# Patient Record
Sex: Female | Born: 1970 | Race: White | Hispanic: No | Marital: Married | Smoking: Current every day smoker
Health system: Southern US, Community
[De-identification: ages and names within clinical notes are randomized; demographics above are authoritative.]

## PROBLEM LIST (undated history)

## (undated) VITALS — BP 128/90 | HR 94 | Temp 98.0°F | Resp 16

## (undated) DIAGNOSIS — I10 Essential (primary) hypertension: Secondary | ICD-10-CM

## (undated) DIAGNOSIS — F99 Mental disorder, not otherwise specified: Secondary | ICD-10-CM

## (undated) DIAGNOSIS — G5630 Lesion of radial nerve, unspecified upper limb: Secondary | ICD-10-CM

## (undated) DIAGNOSIS — E079 Disorder of thyroid, unspecified: Secondary | ICD-10-CM

## (undated) DIAGNOSIS — E039 Hypothyroidism, unspecified: Secondary | ICD-10-CM

## (undated) DIAGNOSIS — F1920 Other psychoactive substance dependence, uncomplicated: Secondary | ICD-10-CM

## (undated) DIAGNOSIS — F1994 Other psychoactive substance use, unspecified with psychoactive substance-induced mood disorder: Secondary | ICD-10-CM

## (undated) DIAGNOSIS — B1920 Unspecified viral hepatitis C without hepatic coma: Secondary | ICD-10-CM

## (undated) HISTORY — PX: ENDOMETRIAL ABLATION: SHX621

## (undated) HISTORY — PX: FOOT SURGERY: SHX648

## (undated) HISTORY — PX: TUBAL LIGATION: SHX77

---

## 2010-08-16 ENCOUNTER — Encounter: Admission: RE | Admit: 2010-08-16 | Discharge: 2010-08-16 | Payer: Self-pay | Admitting: Psychiatry

## 2010-09-11 ENCOUNTER — Emergency Department (HOSPITAL_COMMUNITY)
Admission: EM | Admit: 2010-09-11 | Discharge: 2010-09-11 | Payer: Self-pay | Source: Home / Self Care | Admitting: Emergency Medicine

## 2010-11-17 ENCOUNTER — Emergency Department (HOSPITAL_COMMUNITY)
Admission: EM | Admit: 2010-11-17 | Discharge: 2010-11-17 | Disposition: A | Discharge status: Home / Self Care | Payer: Medicare HMO | Attending: Emergency Medicine | Admitting: Emergency Medicine

## 2010-11-17 DIAGNOSIS — G8929 Other chronic pain: Secondary | ICD-10-CM | POA: Insufficient documentation

## 2010-11-17 DIAGNOSIS — R609 Edema, unspecified: Secondary | ICD-10-CM | POA: Insufficient documentation

## 2010-11-17 DIAGNOSIS — S61209A Unspecified open wound of unspecified finger without damage to nail, initial encounter: Secondary | ICD-10-CM | POA: Insufficient documentation

## 2010-11-17 DIAGNOSIS — Z79899 Other long term (current) drug therapy: Secondary | ICD-10-CM | POA: Insufficient documentation

## 2010-11-17 DIAGNOSIS — M545 Low back pain, unspecified: Secondary | ICD-10-CM | POA: Insufficient documentation

## 2010-11-17 DIAGNOSIS — IMO0002 Reserved for concepts with insufficient information to code with codable children: Secondary | ICD-10-CM | POA: Insufficient documentation

## 2010-11-17 DIAGNOSIS — W230XXA Caught, crushed, jammed, or pinched between moving objects, initial encounter: Secondary | ICD-10-CM | POA: Insufficient documentation

## 2010-11-17 DIAGNOSIS — F329 Major depressive disorder, single episode, unspecified: Secondary | ICD-10-CM | POA: Insufficient documentation

## 2010-11-17 DIAGNOSIS — E039 Hypothyroidism, unspecified: Secondary | ICD-10-CM | POA: Insufficient documentation

## 2010-11-17 DIAGNOSIS — Y929 Unspecified place or not applicable: Secondary | ICD-10-CM | POA: Insufficient documentation

## 2010-11-17 DIAGNOSIS — F3289 Other specified depressive episodes: Secondary | ICD-10-CM | POA: Insufficient documentation

## 2010-12-17 ENCOUNTER — Ambulatory Visit (HOSPITAL_COMMUNITY): Payer: Self-pay | Admitting: Physician Assistant

## 2010-12-17 DIAGNOSIS — F329 Major depressive disorder, single episode, unspecified: Secondary | ICD-10-CM

## 2010-12-30 ENCOUNTER — Inpatient Hospital Stay (INDEPENDENT_AMBULATORY_CARE_PROVIDER_SITE_OTHER)
Admission: RE | Admit: 2010-12-30 | Discharge: 2010-12-30 | Disposition: A | Discharge status: Home / Self Care | Payer: Managed Care, Other (non HMO) | Source: Ambulatory Visit | Attending: Emergency Medicine | Admitting: Emergency Medicine

## 2010-12-30 DIAGNOSIS — M722 Plantar fascial fibromatosis: Secondary | ICD-10-CM

## 2011-01-16 ENCOUNTER — Inpatient Hospital Stay (INDEPENDENT_AMBULATORY_CARE_PROVIDER_SITE_OTHER)
Admission: RE | Admit: 2011-01-16 | Discharge: 2011-01-16 | Disposition: A | Discharge status: Home / Self Care | Payer: Managed Care, Other (non HMO) | Source: Ambulatory Visit | Attending: Family Medicine | Admitting: Family Medicine

## 2011-01-16 DIAGNOSIS — I10 Essential (primary) hypertension: Secondary | ICD-10-CM

## 2011-01-16 DIAGNOSIS — F411 Generalized anxiety disorder: Secondary | ICD-10-CM

## 2011-03-02 ENCOUNTER — Inpatient Hospital Stay (INDEPENDENT_AMBULATORY_CARE_PROVIDER_SITE_OTHER)
Admission: RE | Admit: 2011-03-02 | Discharge: 2011-03-02 | Disposition: A | Discharge status: Home / Self Care | Payer: Managed Care, Other (non HMO) | Source: Ambulatory Visit | Attending: Emergency Medicine | Admitting: Emergency Medicine

## 2011-03-02 DIAGNOSIS — M79609 Pain in unspecified limb: Secondary | ICD-10-CM

## 2011-03-09 ENCOUNTER — Emergency Department (HOSPITAL_COMMUNITY)
Admission: EM | Admit: 2011-03-09 | Discharge: 2011-03-09 | Disposition: A | Discharge status: Home / Self Care | Payer: Managed Care, Other (non HMO) | Attending: Emergency Medicine | Admitting: Emergency Medicine

## 2011-03-09 DIAGNOSIS — R030 Elevated blood-pressure reading, without diagnosis of hypertension: Secondary | ICD-10-CM | POA: Insufficient documentation

## 2011-03-09 DIAGNOSIS — H53149 Visual discomfort, unspecified: Secondary | ICD-10-CM | POA: Insufficient documentation

## 2011-03-09 DIAGNOSIS — Z79899 Other long term (current) drug therapy: Secondary | ICD-10-CM | POA: Insufficient documentation

## 2011-03-09 DIAGNOSIS — R071 Chest pain on breathing: Secondary | ICD-10-CM | POA: Insufficient documentation

## 2011-03-09 DIAGNOSIS — R000 Tachycardia, unspecified: Secondary | ICD-10-CM | POA: Insufficient documentation

## 2011-03-09 DIAGNOSIS — R51 Headache: Secondary | ICD-10-CM | POA: Insufficient documentation

## 2011-03-09 DIAGNOSIS — R209 Unspecified disturbances of skin sensation: Secondary | ICD-10-CM | POA: Insufficient documentation

## 2011-03-09 DIAGNOSIS — R112 Nausea with vomiting, unspecified: Secondary | ICD-10-CM | POA: Insufficient documentation

## 2011-03-09 DIAGNOSIS — F341 Dysthymic disorder: Secondary | ICD-10-CM | POA: Insufficient documentation

## 2011-03-09 DIAGNOSIS — E039 Hypothyroidism, unspecified: Secondary | ICD-10-CM | POA: Insufficient documentation

## 2011-03-09 DIAGNOSIS — F17200 Nicotine dependence, unspecified, uncomplicated: Secondary | ICD-10-CM | POA: Insufficient documentation

## 2011-03-09 LAB — URINALYSIS, ROUTINE W REFLEX MICROSCOPIC
Leukocytes, UA: NEGATIVE
Nitrite: NEGATIVE
Protein, ur: NEGATIVE mg/dL
Specific Gravity, Urine: 1.015 (ref 1.005–1.030)
Urobilinogen, UA: 0.2 mg/dL (ref 0.0–1.0)

## 2011-03-09 LAB — POCT I-STAT, CHEM 8
Chloride: 106 mEq/L (ref 96–112)
HCT: 44 % (ref 36.0–46.0)
Hemoglobin: 15 g/dL (ref 12.0–15.0)
Potassium: 4.1 mEq/L (ref 3.5–5.1)
Sodium: 142 mEq/L (ref 135–145)

## 2011-10-31 ENCOUNTER — Ambulatory Visit (HOSPITAL_COMMUNITY)
Admission: RE | Admit: 2011-10-31 | Discharge: 2011-10-31 | Disposition: A | Discharge status: Home / Self Care | Payer: Managed Care, Other (non HMO) | Source: Ambulatory Visit | Attending: Internal Medicine | Admitting: Internal Medicine

## 2011-10-31 DIAGNOSIS — M7989 Other specified soft tissue disorders: Secondary | ICD-10-CM

## 2011-10-31 DIAGNOSIS — R609 Edema, unspecified: Secondary | ICD-10-CM

## 2011-10-31 DIAGNOSIS — M79609 Pain in unspecified limb: Secondary | ICD-10-CM

## 2011-10-31 DIAGNOSIS — R52 Pain, unspecified: Secondary | ICD-10-CM

## 2011-10-31 NOTE — Progress Notes (Signed)
VASCULAR LAB PRELIMINARY    No obvious evidence of deep vein thrombosis bilaterally. No evidence of Baker's cyst bilaterally.    Barbara Key, 10/31/2011, 6:03 PM

## 2011-11-07 ENCOUNTER — Emergency Department (HOSPITAL_COMMUNITY): Payer: Managed Care, Other (non HMO)

## 2011-11-07 ENCOUNTER — Emergency Department (HOSPITAL_COMMUNITY)
Admission: EM | Admit: 2011-11-07 | Discharge: 2011-11-07 | Disposition: A | Discharge status: Home / Self Care | Payer: Managed Care, Other (non HMO) | Attending: Emergency Medicine | Admitting: Emergency Medicine

## 2011-11-07 ENCOUNTER — Encounter (HOSPITAL_COMMUNITY): Payer: Self-pay | Admitting: *Deleted

## 2011-11-07 DIAGNOSIS — R51 Headache: Secondary | ICD-10-CM | POA: Insufficient documentation

## 2011-11-07 DIAGNOSIS — IMO0001 Reserved for inherently not codable concepts without codable children: Secondary | ICD-10-CM | POA: Insufficient documentation

## 2011-11-07 DIAGNOSIS — R05 Cough: Secondary | ICD-10-CM | POA: Insufficient documentation

## 2011-11-07 DIAGNOSIS — J111 Influenza due to unidentified influenza virus with other respiratory manifestations: Secondary | ICD-10-CM | POA: Insufficient documentation

## 2011-11-07 DIAGNOSIS — J3489 Other specified disorders of nose and nasal sinuses: Secondary | ICD-10-CM | POA: Insufficient documentation

## 2011-11-07 DIAGNOSIS — M549 Dorsalgia, unspecified: Secondary | ICD-10-CM | POA: Insufficient documentation

## 2011-11-07 DIAGNOSIS — Z79899 Other long term (current) drug therapy: Secondary | ICD-10-CM | POA: Insufficient documentation

## 2011-11-07 DIAGNOSIS — R059 Cough, unspecified: Secondary | ICD-10-CM | POA: Insufficient documentation

## 2011-11-07 DIAGNOSIS — R070 Pain in throat: Secondary | ICD-10-CM | POA: Insufficient documentation

## 2011-11-07 DIAGNOSIS — R109 Unspecified abdominal pain: Secondary | ICD-10-CM | POA: Insufficient documentation

## 2011-11-07 DIAGNOSIS — J40 Bronchitis, not specified as acute or chronic: Secondary | ICD-10-CM | POA: Insufficient documentation

## 2011-11-07 DIAGNOSIS — R062 Wheezing: Secondary | ICD-10-CM | POA: Insufficient documentation

## 2011-11-07 DIAGNOSIS — R509 Fever, unspecified: Secondary | ICD-10-CM | POA: Insufficient documentation

## 2011-11-07 DIAGNOSIS — F17200 Nicotine dependence, unspecified, uncomplicated: Secondary | ICD-10-CM | POA: Insufficient documentation

## 2011-11-07 DIAGNOSIS — R1110 Vomiting, unspecified: Secondary | ICD-10-CM | POA: Insufficient documentation

## 2011-11-07 LAB — URINE MICROSCOPIC-ADD ON

## 2011-11-07 LAB — URINALYSIS, ROUTINE W REFLEX MICROSCOPIC
Leukocytes, UA: NEGATIVE
Nitrite: NEGATIVE
Protein, ur: 100 mg/dL — AB
Urobilinogen, UA: 1 mg/dL (ref 0.0–1.0)

## 2011-11-07 LAB — CBC
Hemoglobin: 14.6 g/dL (ref 12.0–15.0)
MCH: 32.3 pg (ref 26.0–34.0)
MCHC: 34 g/dL (ref 30.0–36.0)
RDW: 13.5 % (ref 11.5–15.5)

## 2011-11-07 LAB — BASIC METABOLIC PANEL
BUN: 18 mg/dL (ref 6–23)
Creatinine, Ser: 0.85 mg/dL (ref 0.50–1.10)
GFR calc Af Amer: >90 mL/min (ref >90)
GFR calc non Af Amer: 86 mL/min — ABNORMAL LOW (ref >90)
Potassium: 3.3 mEq/L — ABNORMAL LOW (ref 3.5–5.1)

## 2011-11-07 LAB — DIFFERENTIAL
Basophils Absolute: 0 10*3/uL (ref 0.0–0.1)
Basophils Relative: 0 % (ref 0–1)
Eosinophils Absolute: 0.1 10*3/uL (ref 0.0–0.7)
Monocytes Relative: 8 % (ref 3–12)
Neutrophils Relative %: 73 % (ref 43–77)

## 2011-11-07 MED ORDER — POTASSIUM CHLORIDE 20 MEQ/15ML (10%) PO LIQD
20.0000 meq | Freq: Once | ORAL | Status: AC
  Administered 2011-11-07: 20 meq via ORAL
  Filled 2011-11-07: qty 15

## 2011-11-07 MED ORDER — SODIUM CHLORIDE 0.9 % IV BOLUS (SEPSIS)
1000.0000 mL | Freq: Once | INTRAVENOUS | Status: AC
  Administered 2011-11-07: 1000 mL via INTRAVENOUS

## 2011-11-07 MED ORDER — ALBUTEROL SULFATE HFA 108 (90 BASE) MCG/ACT IN AERS: 1.0000 | INHALATION_SPRAY | Freq: Four times a day (QID) | RESPIRATORY_TRACT | Status: DC | PRN

## 2011-11-07 MED ORDER — HYDROCODONE-ACETAMINOPHEN 5-325 MG PO TABS
1.0000 | ORAL_TABLET | Freq: Once | ORAL | Status: AC
  Administered 2011-11-07: 1 via ORAL
  Filled 2011-11-07: qty 1

## 2011-11-07 MED ORDER — KETOROLAC TROMETHAMINE 30 MG/ML IJ SOLN
30.0000 mg | Freq: Once | INTRAMUSCULAR | Status: AC
  Administered 2011-11-07: 30 mg via INTRAVENOUS
  Filled 2011-11-07: qty 1

## 2011-11-07 MED ORDER — IPRATROPIUM BROMIDE 0.02 % IN SOLN
0.5000 mg | RESPIRATORY_TRACT | Status: DC
  Administered 2011-11-07: 0.5 mg via RESPIRATORY_TRACT
  Filled 2011-11-07: qty 2.5

## 2011-11-07 MED ORDER — ONDANSETRON HCL 4 MG/2ML IJ SOLN
4.0000 mg | Freq: Once | INTRAMUSCULAR | Status: AC
  Administered 2011-11-07: 4 mg via INTRAVENOUS
  Filled 2011-11-07: qty 2

## 2011-11-07 MED ORDER — HYDROMORPHONE HCL PF 1 MG/ML IJ SOLN
1.0000 mg | Freq: Once | INTRAMUSCULAR | Status: AC
  Administered 2011-11-07: 1 mg via INTRAVENOUS
  Filled 2011-11-07: qty 1

## 2011-11-07 MED ORDER — ACETAMINOPHEN-CODEINE #3 300-30 MG PO TABS: 1.0000 | ORAL_TABLET | Freq: Four times a day (QID) | ORAL | Status: AC | PRN

## 2011-11-07 MED ORDER — ALBUTEROL SULFATE (5 MG/ML) 0.5% IN NEBU
2.5000 mg | INHALATION_SOLUTION | RESPIRATORY_TRACT | Status: DC
  Administered 2011-11-07: 2.5 mg via RESPIRATORY_TRACT
  Filled 2011-11-07: qty 0.5

## 2011-11-07 NOTE — ED Notes (Signed)
RT notified of neb tx.

## 2011-11-07 NOTE — ED Notes (Signed)
Per [facility], pt seen by PCP this a.m. & dx'd with kidney infection & flu, given pain meds within the last 2 hours.

## 2011-11-07 NOTE — ED Provider Notes (Signed)
History     CSN: [id number]  Arrival date & time 11/07/11  1607   First MD Initiated Contact with Patient 11/07/11 1702      Chief Complaint  Patient presents with  . Back Pain  . Hematuria    (Consider location/radiation/quality/duration/timing/severity/associated sxs/prior treatment) HPI  47 yoF history healthy presents with multiple complaints. Patient states that last evening she began to experience diffuse body aches, fever to 102. She states she's had multiple episodes of nonbilious nonbloody emesis. She denies area. States that she is unable to tolerate by mouth. She began to experience mild diffuse abdominal pain after multiple episodes of vomiting. She thinks it as soreness secondary to the vomiting. She was seen by her primary care physician this morning was diagnosed with ?pyelonephritis and possible influenza. The patient was prescribed diclofenac, ibuprofen, doxycycline, Tamiflu. She's taken without relief. She states that her back pain has worsened. She describes it as mid back and rates as 6/10 at this time. She denies chest pain, shortness of breath. She states she's had a productive cough along with a frontal headache, sore throat, rhinorrhea nasal congestion. She is a smoker. She denies any history of asthma or COPD. She denies wheezing. Denies hematuria/dysuria/freq/urgency. Urinating less.    ED Notes, ED Provider Notes from 11/07/11 0000 to 11/07/11 16:11:21       Servando Snare Bivens, RN 11/07/2011 16:10      Pt in c/o bilateral flank pain, states she went to her MD and was dx with kidney infection and flu, called back due to increased pain and told to come to ED         Dalbert Batman Reagan, RN 11/07/2011 16:09      Per [facility], pt seen by PCP this a.m. & dx'd with kidney infection & flu, given pain meds within the last 2 hours.    History reviewed. No pertinent past medical history.  History reviewed. No pertinent past surgical history.  History reviewed. No  pertinent family history.  History  Substance Use Topics  . Smoking status: Not on file  . Smokeless tobacco: Not on file  . Alcohol Use: Not on file    OB History    Grav Para Term Preterm Abortions TAB SAB Ect Mult Living                  Review of Systems  All other systems reviewed and are negative.  except as noted HPI   Allergies  Review of patient's allergies indicates no known allergies.  Home Medications   Current Outpatient Rx  Name Route Sig Dispense Refill  . DICLOFENAC SODIUM 75 MG PO TBEC Oral Take 75 mg by mouth 2 (two) times daily.    Marland Kitchen DOXYCYCLINE HYCLATE 100 MG PO TABS Oral Take 100 mg by mouth 2 (two) times daily.    Marland Kitchen LEVOTHYROXINE SODIUM 175 MCG PO TABS Oral Take 175 mcg by mouth daily.    Marland Kitchen DEXATRIM MAX COMPLEX 7 PO CAPS Oral Take 2 capsules by mouth daily.    Marland Kitchen ONDANSETRON HCL 4 MG PO TABS Oral Take 4 mg by mouth every 8 (eight) hours as needed. For nausea    . ACETAMINOPHEN-CODEINE #3 300-30 MG PO TABS Oral Take 1-2 tablets by mouth every 6 (six) hours as needed for pain. 15 tablet 0  . ALBUTEROL SULFATE HFA 108 (90 BASE) MCG/ACT IN AERS Inhalation Inhale 1-2 puffs into the lungs every 6 (six) hours as needed for wheezing. 1 Inhaler 0  BP 134/95  Pulse 80  Temp(Src) 98.8 F (37.1 C) (Oral)  Resp 18  SpO2 93%  Physical Exam  Nursing note and vitals reviewed. Constitutional: She is oriented to person, place, and time. She appears well-developed and well-nourished.  HENT:  Head: Atraumatic.       muc membranes dry  Eyes: Conjunctivae and EOM are normal. Pupils are equal, round, and reactive to light.  Neck: Normal range of motion. Neck supple.  Cardiovascular: Normal rate, regular rhythm, normal heart sounds and intact distal pulses.   Pulmonary/Chest: Effort normal. No respiratory distress. She has wheezes. She has no rales.       Right mid lower lung fields with coarse wheeze, expiratory  Abdominal: Soft. She exhibits no distension.  There is tenderness. There is no rebound and no guarding.       L. diffuse tenderness to palpation, no rebounding, no guarding, no palpable hernia  Musculoskeletal: Normal range of motion. She exhibits no edema and no tenderness.  Neurological: She is alert and oriented to person, place, and time.  Skin: Skin is warm and dry. No rash noted.  Psychiatric: She has a normal mood and affect.    ED Course  Procedures (including critical care time)  Labs Reviewed  URINALYSIS, ROUTINE W REFLEX MICROSCOPIC - Abnormal; Notable for the following:    Color, Urine AMBER (*) BIOCHEMICALS MAY BE AFFECTED BY COLOR   APPearance CLOUDY (*)    Specific Gravity, Urine >1.046 (*)    Bilirubin Urine SMALL (*)    Ketones, ur TRACE (*)    Protein, ur 100 (*)    All other components within normal limits  CBC - Abnormal; Notable for the following:    WBC 10.9 (*)    All other components within normal limits  DIFFERENTIAL - Abnormal; Notable for the following:    Neutro Abs 7.9 (*)    All other components within normal limits  BASIC METABOLIC PANEL - Abnormal; Notable for the following:    Potassium 3.3 (*)    Glucose, Bld 102 (*)    GFR calc non Af Amer 86 (*)    All other components within normal limits  URINE MICROSCOPIC-ADD ON - Abnormal; Notable for the following:    Squamous Epithelial / LPF MANY (*)    Casts HYALINE CASTS (*)    All other components within normal limits  PREGNANCY, URINE   Dg Chest 2 View  11/07/2011  *RADIOLOGY REPORT*  Clinical Data: Back pain and chest pain.  CHEST - 2 VIEW  Comparison: Chest x-ray of 08/16/2010.  Findings: The cardiac silhouette, mediastinal and hilar contours are within normal limits and stable.  The lungs are clear.  The bony thorax is intact.  IMPRESSION: No acute cardiopulmonary findings.  Original Report Authenticated By: P. Loralie Champagne, M.D.     1. Bronchitis   2. Influenza-like illness      MDM   Patient presents with multiple complaints.  She was recently diagnosed this morning with pyelonephritis and possible influenza by her primary care Dr. Her vital signs are stable here. She appears to be in pain. She does have left sided CVA tenderness but no other signs or symptoms of urinary tract infection. She appears to be dehydrated. She has mild wheezing in her right mid lower lung field. Given her influenza-like symptoms we'll check a chest x-ray for pneumonia. Recheck U/A. Patient has been given IV fluids, Zofran, Toradol. She has mild abdominal pain which I believe to be secondary to  slow skeletal pain or vomiting. Her abdomen is benign. I do not feel she needs CT abdomen pelvis at this time. Will reassess after the above measures.  10:14 PM. Chest x-ray is negative for pneumonia. Her wheezing has resolved after albuterol treatment. UA is not consistent with urinary tract infection. The patient has previously been prescribed doxycycline. Advised to continue to take doxycycline. Will prescribe albuterol and tylenol #3 for breakthrough pain. She has prescriptions for diclofenac and ibuprofen. Patient advised to follow with her primary care provider as needed. Given precautions for return.      Forbes Cellar, MD 11/07/11 2215

## 2011-11-07 NOTE — ED Notes (Signed)
Pt. Is unable to use the restroom at this time. 

## 2011-11-07 NOTE — ED Notes (Signed)
Pt in c/o bilateral flank pain, states she went to her MD and was dx with kidney infection and flu, called back due to increased pain and told to come to ED

## 2012-03-29 ENCOUNTER — Encounter: Payer: Self-pay | Admitting: Physical Medicine & Rehabilitation

## 2012-03-29 DIAGNOSIS — G5630 Lesion of radial nerve, unspecified upper limb: Secondary | ICD-10-CM

## 2012-03-29 HISTORY — DX: Lesion of radial nerve, unspecified upper limb: G56.30

## 2012-04-02 ENCOUNTER — Encounter (HOSPITAL_COMMUNITY): Payer: Self-pay | Admitting: Emergency Medicine

## 2012-04-02 ENCOUNTER — Encounter (HOSPITAL_COMMUNITY): Payer: Self-pay | Admitting: *Deleted

## 2012-04-02 ENCOUNTER — Emergency Department (HOSPITAL_COMMUNITY): Payer: Managed Care, Other (non HMO)

## 2012-04-02 ENCOUNTER — Emergency Department (HOSPITAL_COMMUNITY)
Admission: EM | Admit: 2012-04-02 | Discharge: 2012-04-02 | Disposition: A | Discharge status: Other Acute Inpatient Hospital | Payer: Managed Care, Other (non HMO) | Attending: Emergency Medicine | Admitting: Emergency Medicine

## 2012-04-02 ENCOUNTER — Inpatient Hospital Stay (HOSPITAL_COMMUNITY)
Admission: EM | Admit: 2012-04-02 | Discharge: 2012-04-07 | DRG: 897 | Disposition: A | Discharge status: Home / Self Care | Payer: Managed Care, Other (non HMO) | Source: Ambulatory Visit | Attending: Psychiatry | Admitting: Psychiatry

## 2012-04-02 DIAGNOSIS — Z046 Encounter for general psychiatric examination, requested by authority: Secondary | ICD-10-CM | POA: Insufficient documentation

## 2012-04-02 DIAGNOSIS — F10939 Alcohol use, unspecified with withdrawal, unspecified: Secondary | ICD-10-CM | POA: Diagnosis present

## 2012-04-02 DIAGNOSIS — G8929 Other chronic pain: Secondary | ICD-10-CM | POA: Diagnosis present

## 2012-04-02 DIAGNOSIS — F1120 Opioid dependence, uncomplicated: Secondary | ICD-10-CM

## 2012-04-02 DIAGNOSIS — F19939 Other psychoactive substance use, unspecified with withdrawal, unspecified: Principal | ICD-10-CM | POA: Diagnosis present

## 2012-04-02 DIAGNOSIS — F17200 Nicotine dependence, unspecified, uncomplicated: Secondary | ICD-10-CM | POA: Diagnosis present

## 2012-04-02 DIAGNOSIS — F1020 Alcohol dependence, uncomplicated: Secondary | ICD-10-CM | POA: Diagnosis present

## 2012-04-02 DIAGNOSIS — F1994 Other psychoactive substance use, unspecified with psychoactive substance-induced mood disorder: Secondary | ICD-10-CM

## 2012-04-02 DIAGNOSIS — F489 Nonpsychotic mental disorder, unspecified: Secondary | ICD-10-CM | POA: Insufficient documentation

## 2012-04-02 DIAGNOSIS — E079 Disorder of thyroid, unspecified: Secondary | ICD-10-CM | POA: Insufficient documentation

## 2012-04-02 DIAGNOSIS — E039 Hypothyroidism, unspecified: Secondary | ICD-10-CM

## 2012-04-02 DIAGNOSIS — M25579 Pain in unspecified ankle and joints of unspecified foot: Secondary | ICD-10-CM | POA: Insufficient documentation

## 2012-04-02 DIAGNOSIS — Z79899 Other long term (current) drug therapy: Secondary | ICD-10-CM | POA: Insufficient documentation

## 2012-04-02 DIAGNOSIS — F10239 Alcohol dependence with withdrawal, unspecified: Secondary | ICD-10-CM | POA: Diagnosis present

## 2012-04-02 DIAGNOSIS — F1920 Other psychoactive substance dependence, uncomplicated: Secondary | ICD-10-CM

## 2012-04-02 DIAGNOSIS — Z9889 Other specified postprocedural states: Secondary | ICD-10-CM | POA: Insufficient documentation

## 2012-04-02 HISTORY — DX: Disorder of thyroid, unspecified: E07.9

## 2012-04-02 LAB — RAPID URINE DRUG SCREEN, HOSP PERFORMED
Amphetamines: NOT DETECTED
Benzodiazepines: NOT DETECTED
Opiates: POSITIVE — AB

## 2012-04-02 LAB — CBC WITH DIFFERENTIAL/PLATELET
Basophils Relative: 0 % (ref 0–1)
Eosinophils Absolute: 0.3 10*3/uL (ref 0.0–0.7)
Eosinophils Relative: 5 % (ref 0–5)
Lymphs Abs: 1.5 10*3/uL (ref 0.7–4.0)
MCH: 33.2 pg (ref 26.0–34.0)
MCHC: 33.2 g/dL (ref 30.0–36.0)
MCV: 99.7 fL (ref 78.0–100.0)
Monocytes Relative: 9 % (ref 3–12)
Platelets: 239 10*3/uL (ref 150–400)
RBC: 3.89 MIL/uL (ref 3.87–5.11)

## 2012-04-02 LAB — COMPREHENSIVE METABOLIC PANEL
BUN: 17 mg/dL (ref 6–23)
Calcium: 9 mg/dL (ref 8.4–10.5)
GFR calc Af Amer: >90 mL/min (ref >90)
Glucose, Bld: 106 mg/dL — ABNORMAL HIGH (ref 70–99)
Sodium: 137 mEq/L (ref 135–145)
Total Protein: 7.7 g/dL (ref 6.0–8.3)

## 2012-04-02 MED ORDER — CLONIDINE HCL 0.1 MG PO TABS
0.1000 mg | ORAL_TABLET | ORAL | Status: AC
  Administered 2012-04-05 – 2012-04-07 (×4): 0.1 mg via ORAL
  Filled 2012-04-02 (×4): qty 1

## 2012-04-02 MED ORDER — DICYCLOMINE HCL 20 MG PO TABS: 20.0000 mg | ORAL_TABLET | Freq: Four times a day (QID) | ORAL | Status: DC | PRN

## 2012-04-02 MED ORDER — VITAMIN B-1 100 MG PO TABS
100.0000 mg | ORAL_TABLET | Freq: Every day | ORAL | Status: DC
  Administered 2012-04-03 – 2012-04-07 (×5): 100 mg via ORAL
  Filled 2012-04-02 (×6): qty 1

## 2012-04-02 MED ORDER — LEVOTHYROXINE SODIUM 175 MCG PO TABS: 175.0000 ug | ORAL_TABLET | Freq: Every day | ORAL | Status: DC

## 2012-04-02 MED ORDER — THIAMINE HCL 100 MG/ML IJ SOLN: 100.0000 mg | Freq: Once | INTRAMUSCULAR | Status: DC

## 2012-04-02 MED ORDER — CHLORDIAZEPOXIDE HCL 25 MG PO CAPS
25.0000 mg | ORAL_CAPSULE | Freq: Three times a day (TID) | ORAL | Status: AC
  Administered 2012-04-04 – 2012-04-05 (×3): 25 mg via ORAL
  Filled 2012-04-02 (×5): qty 1

## 2012-04-02 MED ORDER — CHLORDIAZEPOXIDE HCL 25 MG PO CAPS
25.0000 mg | ORAL_CAPSULE | ORAL | Status: AC
  Administered 2012-04-06 (×2): 25 mg via ORAL
  Filled 2012-04-02 (×2): qty 1

## 2012-04-02 MED ORDER — ONDANSETRON HCL 4 MG PO TABS: 4.0000 mg | ORAL_TABLET | Freq: Three times a day (TID) | ORAL | Status: DC | PRN

## 2012-04-02 MED ORDER — LORAZEPAM 1 MG PO TABS
1.0000 mg | ORAL_TABLET | Freq: Once | ORAL | Status: AC
  Administered 2012-04-02: 1 mg via ORAL
  Filled 2012-04-02: qty 1

## 2012-04-02 MED ORDER — CHLORDIAZEPOXIDE HCL 25 MG PO CAPS
25.0000 mg | ORAL_CAPSULE | Freq: Every day | ORAL | Status: AC
  Administered 2012-04-07: 25 mg via ORAL
  Filled 2012-04-02: qty 1

## 2012-04-02 MED ORDER — IBUPROFEN 600 MG PO TABS
600.0000 mg | ORAL_TABLET | Freq: Three times a day (TID) | ORAL | Status: DC | PRN
  Administered 2012-04-02: 600 mg via ORAL
  Filled 2012-04-02: qty 1

## 2012-04-02 MED ORDER — ACETAMINOPHEN 325 MG PO TABS
650.0000 mg | ORAL_TABLET | Freq: Four times a day (QID) | ORAL | Status: DC | PRN
  Administered 2012-04-03: 650 mg via ORAL

## 2012-04-02 MED ORDER — ADULT MULTIVITAMIN W/MINERALS CH
1.0000 | ORAL_TABLET | Freq: Every day | ORAL | Status: DC
  Administered 2012-04-03 – 2012-04-07 (×5): 1 via ORAL
  Filled 2012-04-02 (×6): qty 1

## 2012-04-02 MED ORDER — NICOTINE 21 MG/24HR TD PT24
21.0000 mg | MEDICATED_PATCH | Freq: Once | TRANSDERMAL | Status: DC
  Administered 2012-04-02: 21 mg via TRANSDERMAL
  Filled 2012-04-02: qty 1

## 2012-04-02 MED ORDER — CEPHALEXIN 500 MG PO CAPS
500.0000 mg | ORAL_CAPSULE | Freq: Four times a day (QID) | ORAL | Status: DC
  Administered 2012-04-02: 500 mg via ORAL
  Filled 2012-04-02: qty 1

## 2012-04-02 MED ORDER — LOPERAMIDE HCL 2 MG PO CAPS: 2.0000 mg | ORAL_CAPSULE | ORAL | Status: AC | PRN

## 2012-04-02 MED ORDER — CLONIDINE HCL 0.1 MG PO TABS
0.1000 mg | ORAL_TABLET | Freq: Every day | ORAL | Status: DC
  Filled 2012-04-02: qty 1

## 2012-04-02 MED ORDER — LORAZEPAM 1 MG PO TABS: 1.0000 mg | ORAL_TABLET | Freq: Three times a day (TID) | ORAL | Status: DC | PRN

## 2012-04-02 MED ORDER — ONDANSETRON 4 MG PO TBDP: 4.0000 mg | ORAL_TABLET | Freq: Four times a day (QID) | ORAL | Status: DC | PRN

## 2012-04-02 MED ORDER — IBUPROFEN 800 MG PO TABS
800.0000 mg | ORAL_TABLET | Freq: Once | ORAL | Status: AC
  Administered 2012-04-02: 800 mg via ORAL
  Filled 2012-04-02: qty 1

## 2012-04-02 MED ORDER — CHLORDIAZEPOXIDE HCL 25 MG PO CAPS: 25.0000 mg | ORAL_CAPSULE | Freq: Four times a day (QID) | ORAL | Status: DC | PRN

## 2012-04-02 MED ORDER — NICOTINE 21 MG/24HR TD PT24
21.0000 mg | MEDICATED_PATCH | Freq: Every day | TRANSDERMAL | Status: DC
  Administered 2012-04-03 – 2012-04-07 (×5): 21 mg via TRANSDERMAL
  Filled 2012-04-02 (×7): qty 1

## 2012-04-02 MED ORDER — CHLORDIAZEPOXIDE HCL 25 MG PO CAPS
25.0000 mg | ORAL_CAPSULE | Freq: Four times a day (QID) | ORAL | Status: AC
  Administered 2012-04-03 – 2012-04-04 (×6): 25 mg via ORAL
  Filled 2012-04-02 (×4): qty 1

## 2012-04-02 MED ORDER — NAPROXEN 500 MG PO TABS
500.0000 mg | ORAL_TABLET | Freq: Two times a day (BID) | ORAL | Status: DC | PRN
  Administered 2012-04-03: 500 mg via ORAL
  Filled 2012-04-02: qty 1

## 2012-04-02 MED ORDER — GADOBENATE DIMEGLUMINE 529 MG/ML IV SOLN
15.0000 mL | Freq: Once | INTRAVENOUS | Status: AC | PRN
  Administered 2012-04-02: 15 mL via INTRAVENOUS

## 2012-04-02 MED ORDER — LEVOTHYROXINE SODIUM 175 MCG PO TABS
175.0000 ug | ORAL_TABLET | Freq: Every day | ORAL | Status: DC
  Administered 2012-04-03 – 2012-04-07 (×5): 175 ug via ORAL
  Filled 2012-04-02 (×7): qty 1

## 2012-04-02 MED ORDER — ACETAMINOPHEN 325 MG PO TABS
650.0000 mg | ORAL_TABLET | ORAL | Status: DC | PRN
Start: 2012-04-02 — End: 2012-04-02

## 2012-04-02 MED ORDER — ALUM & MAG HYDROXIDE-SIMETH 200-200-20 MG/5ML PO SUSP
30.0000 mL | ORAL | Status: DC | PRN
  Administered 2012-04-06: 30 mL via ORAL

## 2012-04-02 MED ORDER — LOPERAMIDE HCL 2 MG PO CAPS: 2.0000 mg | ORAL_CAPSULE | ORAL | Status: DC | PRN

## 2012-04-02 MED ORDER — MAGNESIUM HYDROXIDE 400 MG/5ML PO SUSP: 30.0000 mL | Freq: Every day | ORAL | Status: DC | PRN

## 2012-04-02 MED ORDER — CLONIDINE HCL 0.1 MG PO TABS
0.1000 mg | ORAL_TABLET | Freq: Four times a day (QID) | ORAL | Status: AC
  Administered 2012-04-02 – 2012-04-05 (×9): 0.1 mg via ORAL
  Filled 2012-04-02 (×11): qty 1

## 2012-04-02 MED ORDER — HYDROXYZINE HCL 25 MG PO TABS: 25.0000 mg | ORAL_TABLET | Freq: Four times a day (QID) | ORAL | Status: DC | PRN

## 2012-04-02 MED ORDER — METHOCARBAMOL 500 MG PO TABS
500.0000 mg | ORAL_TABLET | Freq: Three times a day (TID) | ORAL | Status: DC | PRN
  Administered 2012-04-03: 500 mg via ORAL
  Filled 2012-04-02 (×2): qty 1

## 2012-04-02 MED ORDER — ZOLPIDEM TARTRATE 5 MG PO TABS: 5.0000 mg | ORAL_TABLET | Freq: Every evening | ORAL | Status: DC | PRN

## 2012-04-02 MED ORDER — CHLORDIAZEPOXIDE HCL 25 MG PO CAPS
50.0000 mg | ORAL_CAPSULE | Freq: Once | ORAL | Status: AC
  Administered 2012-04-02: 50 mg via ORAL
  Filled 2012-04-02: qty 2

## 2012-04-02 NOTE — ED Notes (Signed)
Two patient belonging bags placed in [place] [facility]

## 2012-04-02 NOTE — ED Provider Notes (Signed)
History     CSN: [id number]  Arrival date & time 04/02/12  1255   First MD Initiated Contact with Patient 04/02/12 1429      Chief Complaint  Patient presents with  . V70.1    (Consider location/radiation/quality/duration/timing/severity/associated sxs/prior treatment) HPI Comments: Pt is a 41yo female who is here with depression, anxiety, drug and alcohol addiction, SI. Pt states she is lonely, feeling more depressed. No interest in things. Plan of SI, states wants to overdose on medications. 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 States that she wants detox and help with her depression. Pt also complaining of pain in right foot. States pain since surgery that she had on that foot for a bony deformity of 1st metatarsal. States since surgery, which was 4 months ago, pain has been worsening. She was seen by her orthopedist who is in [facility] and was referred to pain management. Pt states "something is wrong with my foot, it is getting worse instead of better."  denies fever, chills, malaise. No new foot injuries.    History reviewed. No pertinent past medical history.  History reviewed. No pertinent past surgical history.  History reviewed. No pertinent family history.  History  Substance Use Topics  . Smoking status: Current Everyday Smoker  . Smokeless tobacco: Not on file  . Alcohol Use: Yes    OB History    Grav Para Term Preterm Abortions TAB SAB Ect Mult Living                  Review of Systems  Constitutional: Negative for fever and chills.  Respiratory: Negative.   Cardiovascular: Negative.   Gastrointestinal: Negative.   Musculoskeletal: Positive for joint swelling, arthralgias and gait problem.  Skin: Positive for color change.  Neurological: Negative for dizziness, weakness and numbness.  Psychiatric/Behavioral: Positive for suicidal ideas and disturbed wake/sleep cycle. The patient is nervous/anxious.     Allergies  Review of patient's allergies  indicates no known allergies.  Home Medications   Current Outpatient Rx  Name Route Sig Dispense Refill  . ALBUTEROL SULFATE HFA 108 (90 BASE) MCG/ACT IN AERS Inhalation Inhale 1-2 puffs into the lungs every 6 (six) hours as needed for wheezing. 1 Inhaler 0  . LEVOTHYROXINE SODIUM 175 MCG PO TABS Oral Take 175 mcg by mouth daily.    . SERTRALINE HCL 100 MG PO TABS Oral Take 100 mg by mouth daily.    . TRAZODONE HCL 100 MG PO TABS Oral Take 100 mg by mouth at bedtime.      BP 128/85  Pulse 93  Temp 98.2 F (36.8 C) (Oral)  Resp 18  SpO2 95%  Physical Exam  Nursing note and vitals reviewed. Constitutional: She is oriented to person, place, and time. She appears well-developed and well-nourished. No distress.  HENT:  Head: Normocephalic.  Eyes: Conjunctivae are normal.  Neck: Neck supple.  Cardiovascular: Normal rate, regular rhythm and normal heart sounds.   Pulmonary/Chest: Effort normal and breath sounds normal. No respiratory distress. She has no wheezes. She has no rales.  Abdominal: Soft. Bowel sounds are normal. She exhibits no distension. There is no tenderness. There is no rebound.  Musculoskeletal:       Significant swelling of right foot. Foot is tender all over. Pain with any toe movement. Good cap refill to all toes <2sec. Normal ankle exam.   Neurological: She is alert and oriented to person, place, and time.  Skin: Skin is warm and dry.  Psychiatric:  Tearful, suicidal thoughts    ED Course  Procedures (including critical care time)  Pt tearful, SI, depression. Screening labs ordered. Foot x-ray ordered. Will monitor.    Labs Reviewed  COMPREHENSIVE METABOLIC PANEL - Abnormal; Notable for the following:    Glucose, Bld 106 (*)     Alkaline Phosphatase 119 (*)     All other components within normal limits  URINE RAPID DRUG SCREEN (HOSP PERFORMED) - Abnormal; Notable for the following:    Opiates POSITIVE (*)     Cocaine POSITIVE (*)      Tetrahydrocannabinol POSITIVE (*)     All other components within normal limits  ETHANOL - Abnormal; Notable for the following:    Alcohol, Ethyl (B) 15 (*)     All other components within normal limits  CBC WITH DIFFERENTIAL  PREGNANCY, URINE   Dg Foot Complete Right  04/02/2012  *RADIOLOGY REPORT*  Clinical Data: Anterior right foot pain for the past month. Swelling.  Right foot surgery 4 months ago.  RIGHT FOOT COMPLETE - 3+ VIEW  Comparison: None.  Findings: Dorsal and medial soft tissue swelling, most pronounced at the level of the articulation of the first metatarsal and medial cuneiform.  K-wire and screw and plate fixation of the first metatarsal and medial cuneiform is demonstrated with some lucency surrounding the distal aspect of the plate in an area of periosteal new bone arising from the first metatarsal.  There are several tiny metallic fragments in the adjacent soft tissues.  The proximal fixation screws are bridging the medial and middle cuneiform.  No soft tissue gas is seen.  There is also some bony fragmentation between the base of the first metatarsal and second metatarsal with a linear lucency crossing the base of the second metatarsal at that level.  On the lateral view, there is a linear lucency in the ventral aspect of the one of the metatarsals, possibly the second metatarsal, with ill-defined bony margins and adjacent bony fragmentation.  IMPRESSION:  1. Postsurgical changes, as described above. 2.  Findings suggesting a nondisplaced fracture and possible osteomyelitis involving the base of the second metatarsal. 3.  Periosteal new bone adjacent to the distal aspect of the fixation plate with lucency adjacent to the distal portion of the plate.  This could be due to plate motion or osteomyelitis. 4.  Soft tissue swelling and several tiny metallic densities in the soft tissues at the level of the surgical changes.  Original Report Authenticated By: Darrol Angel, M.D.   Concern  for possible right foot osteomyelitis. I spoke with Dr. Azucena Kuba, radiology about ordering confirmatory test, advised MR with and wo contrast.  I spoke with ACT, explained that pt not medically cleared yet, pending MR. Will sign out at shift change with [facility] and Dr. Estell Harpin   1. Suicidal behavior       MDM          Lottie Mussel, PA 04/03/12 [phone]

## 2012-04-02 NOTE — ED Notes (Signed)
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  Pt reports SI with plan to overdose on prescription medications, pt denies HI.  Pt calm and cooperative but tearful.

## 2012-04-02 NOTE — ED Provider Notes (Signed)
History     CSN: [id number]  Arrival date & time 04/02/12  1255   First MD Initiated Contact with Patient 04/02/12 1429      Chief Complaint  Patient presents with  . V70.1    (Consider location/radiation/quality/duration/timing/severity/associated sxs/prior treatment) HPI  Past Medical History  Diagnosis Date  . Thyroid disease   . Cholelithiases     Past Surgical History  Procedure Date  . Foot surgery     History reviewed. No pertinent family history.  History  Substance Use Topics  . Smoking status: Current Everyday Smoker  . Smokeless tobacco: Not on file  . Alcohol Use: Yes    OB History    Grav Para Term Preterm Abortions TAB SAB Ect Mult Living                  Review of Systems  Allergies  Review of patient's allergies indicates no known allergies.  Home Medications   Current Outpatient Rx  Name Route Sig Dispense Refill  . ALBUTEROL SULFATE HFA 108 (90 BASE) MCG/ACT IN AERS Inhalation Inhale 1-2 puffs into the lungs every 6 (six) hours as needed for wheezing. 1 Inhaler 0  . LEVOTHYROXINE SODIUM 175 MCG PO TABS Oral Take 175 mcg by mouth daily.    . SERTRALINE HCL 100 MG PO TABS Oral Take 100 mg by mouth daily.    . TRAZODONE HCL 100 MG PO TABS Oral Take 100 mg by mouth at bedtime.      BP 141/97  Pulse 76  Temp 98 F (36.7 C) (Oral)  Resp 18  SpO2 98%  Physical Exam  ED Course  Procedures (including critical care time)  Labs Reviewed  COMPREHENSIVE METABOLIC PANEL - Abnormal; Notable for the following:    Glucose, Bld 106 (*)     Alkaline Phosphatase 119 (*)     All other components within normal limits  URINE RAPID DRUG SCREEN (HOSP PERFORMED) - Abnormal; Notable for the following:    Opiates POSITIVE (*)     Cocaine POSITIVE (*)     Tetrahydrocannabinol POSITIVE (*)     All other components within normal limits  ETHANOL - Abnormal; Notable for the following:    Alcohol, Ethyl (B) 15 (*)     All other components within normal  limits  CBC WITH DIFFERENTIAL  PREGNANCY, URINE   Mr Foot Right W Wo Contrast  04/02/2012  *RADIOLOGY REPORT*  Clinical Data: Right foot pain.  Osteomyelitis.  MRI OF THE RIGHT FOREFOOT WITHOUT AND WITH CONTRAST  Technique:  Multiplanar, multisequence MR imaging was performed both before and after administration of intravenous contrast.  Contrast: 15mL MULTIHANCE GADOBENATE DIMEGLUMINE 529 MG/ML IV SOLN  Comparison: None.  Findings: Postoperative changes are present at the first tarsometatarsal junction.  This produces susceptibility artifact that obscures the area around the first tarsometatarsal junction. Although suboptimally evaluated, there appears to be either an osteotomy or K-wire tract in the second metatarsal base.  There is no effacement of fatty marrow or marrow edema on inversion recovery images in the second metatarsal base to suggest osteomyelitis.  There is no osteolysis or osteomyelitis in the visualized forefoot. Dorsal subcutaneous edema is present in the foot which is nonspecific.  No abscess is identified. The flexor and extensor tendons appear within normal limits.  Phlegmon is present in the soft tissues plantar to the distal first and second metatarsals.  This may represent an area of trauma with soft tissue inflammatory change.  IMPRESSION: 1.  No  evidence of osteomyelitis or soft tissue abscess. 2.  Suboptimal evaluation of the first and second tarsometatarsal junction due to hardware artifact. 3.  Nonspecific soft tissue swelling, more prominent along the dorsal aspect of the foot. This is most commonly associated with dependent edema or cellulitis in the appropriate clinical setting. 4.  Phlegmon present in the plantar aspect of the forefoot, plantar to the first and second metatarsals.  Potentially this relates to trauma or laceration.  Original Report Authenticated By: Andreas Newport, M.D.   Dg Foot Complete Right  04/02/2012  *RADIOLOGY REPORT*  Clinical Data: Anterior right foot  pain for the past month. Swelling.  Right foot surgery 4 months ago.  RIGHT FOOT COMPLETE - 3+ VIEW  Comparison: None.  Findings: Dorsal and medial soft tissue swelling, most pronounced at the level of the articulation of the first metatarsal and medial cuneiform.  K-wire and screw and plate fixation of the first metatarsal and medial cuneiform is demonstrated with some lucency surrounding the distal aspect of the plate in an area of periosteal new bone arising from the first metatarsal.  There are several tiny metallic fragments in the adjacent soft tissues.  The proximal fixation screws are bridging the medial and middle cuneiform.  No soft tissue gas is seen.  There is also some bony fragmentation between the base of the first metatarsal and second metatarsal with a linear lucency crossing the base of the second metatarsal at that level.  On the lateral view, there is a linear lucency in the ventral aspect of the one of the metatarsals, possibly the second metatarsal, with ill-defined bony margins and adjacent bony fragmentation.  IMPRESSION:  1. Postsurgical changes, as described above. 2.  Findings suggesting a nondisplaced fracture and possible osteomyelitis involving the base of the second metatarsal. 3.  Periosteal new bone adjacent to the distal aspect of the fixation plate with lucency adjacent to the distal portion of the plate.  This could be due to plate motion or osteomyelitis. 4.  Soft tissue swelling and several tiny metallic densities in the soft tissues at the level of the surgical changes.  Original Report Authenticated By: Darrol Angel, M.D.     1. Suicidal behavior       MDM  Care of patient taken over from T. Kirichenko, PA-C - review of MRI reveals no osteomylitis - marked soft tissue swelling - plan to place the patient on antibiotics and I have spoken with ACT, she is medically cleared.       Izola Price Norene, [place] 04/02/12 1952

## 2012-04-02 NOTE — ED Provider Notes (Signed)
Medical screening examination/treatment/procedure(s) were performed by non-physician practitioner and as supervising physician I was immediately available for consultation/collaboration.   Mckenlee Mangham L Taylie Helder, MD 04/02/12 2323 
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
## 2012-04-02 NOTE — ED Notes (Signed)
Off floor for testing 
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
## 2012-04-03 DIAGNOSIS — F1994 Other psychoactive substance use, unspecified with psychoactive substance-induced mood disorder: Secondary | ICD-10-CM

## 2012-04-03 DIAGNOSIS — E039 Hypothyroidism, unspecified: Secondary | ICD-10-CM | POA: Diagnosis present

## 2012-04-03 DIAGNOSIS — F1120 Opioid dependence, uncomplicated: Secondary | ICD-10-CM

## 2012-04-03 MED ORDER — AMITRIPTYLINE HCL 25 MG PO TABS
25.0000 mg | ORAL_TABLET | Freq: Every day | ORAL | Status: DC
  Administered 2012-04-03 – 2012-04-04 (×2): 25 mg via ORAL
  Filled 2012-04-03 (×5): qty 1

## 2012-04-03 MED ORDER — GABAPENTIN 600 MG PO TABS
300.0000 mg | ORAL_TABLET | Freq: Four times a day (QID) | ORAL | Status: DC
  Administered 2012-04-03 (×2): 300 mg via ORAL
  Filled 2012-04-03: qty 0.5
  Filled 2012-04-03: qty 4
  Filled 2012-04-03: qty 0.5
  Filled 2012-04-03 (×2): qty 1
  Filled 2012-04-03: qty 0.5

## 2012-04-03 MED ORDER — SERTRALINE HCL 100 MG PO TABS
100.0000 mg | ORAL_TABLET | Freq: Every day | ORAL | Status: DC
  Administered 2012-04-03: 100 mg via ORAL
  Filled 2012-04-03 (×3): qty 1

## 2012-04-03 MED ORDER — ALBUTEROL SULFATE HFA 108 (90 BASE) MCG/ACT IN AERS: 1.0000 | INHALATION_SPRAY | Freq: Four times a day (QID) | RESPIRATORY_TRACT | Status: DC | PRN

## 2012-04-03 MED ORDER — IBUPROFEN 800 MG PO TABS
800.0000 mg | ORAL_TABLET | Freq: Four times a day (QID) | ORAL | Status: DC
  Administered 2012-04-03 – 2012-04-07 (×16): 800 mg via ORAL
  Filled 2012-04-03 (×22): qty 1

## 2012-04-03 MED ORDER — PANTOPRAZOLE SODIUM 20 MG PO TBEC
20.0000 mg | DELAYED_RELEASE_TABLET | Freq: Two times a day (BID) | ORAL | Status: DC
  Administered 2012-04-03 – 2012-04-07 (×8): 20 mg via ORAL
  Filled 2012-04-03 (×13): qty 1

## 2012-04-03 MED ORDER — TRAZODONE HCL 100 MG PO TABS
100.0000 mg | ORAL_TABLET | Freq: Every day | ORAL | Status: DC
  Administered 2012-04-03 – 2012-04-06 (×4): 100 mg via ORAL
  Filled 2012-04-03 (×6): qty 1

## 2012-04-03 MED ORDER — LEVOTHYROXINE SODIUM 175 MCG PO TABS
175.0000 ug | ORAL_TABLET | Freq: Every day | ORAL | Status: DC
  Administered 2012-04-03: 175 ug via ORAL

## 2012-04-03 MED ORDER — DULOXETINE HCL 20 MG PO CPEP
20.0000 mg | ORAL_CAPSULE | Freq: Two times a day (BID) | ORAL | Status: DC
  Administered 2012-04-03 – 2012-04-07 (×8): 20 mg via ORAL
  Filled 2012-04-03 (×11): qty 1

## 2012-04-03 NOTE — [facility] Suicide Risk Assessment (Signed)
Suicide Risk Assessment  Admission Assessment     Demographic factors:  Assessment Details Time of Assessment: Admission Information Obtained From: Patient Current Mental Status:  Current Mental Status: Suicidal ideation indicated by patient Loss Factors:  Loss Factors: Financial problems / change in socioeconomic status Historical Factors:  Historical Factors: Prior suicide attempts Risk Reduction Factors:  Risk Reduction Factors: Living with another person, especially a relative  CLINICAL FACTORS:   Severe Anxiety and/or Agitation Depression:   Anhedonia Comorbid alcohol abuse/dependence Hopelessness Insomnia Alcohol/Substance Abuse/Dependencies Chronic Pain Previous Psychiatric Diagnoses and Treatments  COGNITIVE FEATURES THAT CONTRIBUTE TO RISK:  Closed-mindedness Polarized thinking Thought constriction (tunnel vision)    SUICIDE RISK:   Moderate:  Frequent suicidal ideation with limited intensity, and duration, some specificity in terms of plans, no associated intent, good self-control, limited dysphoria/symptomatology, some risk factors present, and identifiable protective factors, including available and accessible social support.  Diagnosis:   Axis I: Substance Abuse and Substance Induced Mood Disorder Axis II: Deferred Axis III:  Past Medical History  Diagnosis Date  . Thyroid disease   . Cholelithiases    Axis IV: other psychosocial or environmental problems and problems with access to health care services Axis V: 41-50 serious symptoms  ADL's:  Intact  Sleep: Good  Appetite:  Good  Suicidal Ideation:  "I'm not sure if I want to live."  Pt appears sad and quite depressed. Homicidal Ideation:  Pt denies any thoughts, plans, intent of homicide  Mental Status Examination/Evaluation: Objective:  Appearance: Casual  Eye Contact::  Minimal  Speech:  Clear and Coherent  Volume:  Normal  Mood:  Anxious, Depressed, Hopeless, Irritable and Worthless  Affect:   Blunt  Thought Process:  Coherent  Orientation:  Full  Thought Content:  WDL  Suicidal Thoughts:  Yes.  without intent/plan  Homicidal Thoughts:  No  Memory:  Immediate;   Fair Recent;   Fair Remote;   Fair  Judgement:  Impaired  Insight:  Lacking  Psychomotor Activity:  Normal  Concentration:  Fair  Recall:  Fair  Akathisia:  No  Handed:  Right  AIMS (if indicated):     Assets:  Communication Skills Desire for Improvement  Sleep:  Number of Hours: 6.75    Vital Signs:Blood pressure 131/88, pulse 69, temperature 97.9 F (36.6 C), temperature source Oral, resp. rate 16. Current Medications: Current Facility-Administered Medications  Medication Dose Route Frequency Provider Last Rate Last Dose  . acetaminophen (TYLENOL) tablet 650 mg  650 mg Oral Q6H PRN Ronny Bacon, MD   650 mg at 04/03/12 0854  . albuterol (PROVENTIL HFA;VENTOLIN HFA) 108 (90 BASE) MCG/ACT inhaler 1-2 puff  1-2 puff Inhalation Q6H PRN Mickie D. Adams, PA      . alum & mag hydroxide-simeth (MAALOX/MYLANTA) 200-200-20 MG/5ML suspension 30 mL  30 mL Oral Q4H PRN Curlene Labrum Readling, MD      . chlordiazePOXIDE (LIBRIUM) capsule 25 mg  25 mg Oral Q6H PRN Curlene Labrum Readling, MD      . chlordiazePOXIDE (LIBRIUM) capsule 25 mg  25 mg Oral QID Curlene Labrum Readling, MD   25 mg at 04/03/12 1444   Followed by  . chlordiazePOXIDE (LIBRIUM) capsule 25 mg  25 mg Oral TID Ronny Bacon, MD       Followed by  . chlordiazePOXIDE (LIBRIUM) capsule 25 mg  25 mg Oral BH-qamhs Randy D Readling, MD       Followed by  . chlordiazePOXIDE (LIBRIUM) capsule 25 mg  25 mg  Oral Daily Curlene Labrum Readling, MD      . chlordiazePOXIDE (LIBRIUM) capsule 50 mg  50 mg Oral Once Ronny Bacon, MD   50 mg at 04/02/12 2240  . cloNIDine (CATAPRES) tablet 0.1 mg  0.1 mg Oral QID Curlene Labrum Readling, MD   0.1 mg at 04/03/12 1445   Followed by  . cloNIDine (CATAPRES) tablet 0.1 mg  0.1 mg Oral BH-qamhs Randy D Readling, MD       Followed by  . cloNIDine  (CATAPRES) tablet 0.1 mg  0.1 mg Oral QAC breakfast Curlene Labrum Readling, MD      . dicyclomine (BENTYL) tablet 20 mg  20 mg Oral Q6H PRN Curlene Labrum Readling, MD      . hydrOXYzine (ATARAX/VISTARIL) tablet 25 mg  25 mg Oral Q6H PRN Curlene Labrum Readling, MD      . levothyroxine (SYNTHROID, LEVOTHROID) tablet 175 mcg  175 mcg Oral QAC breakfast Curlene Labrum Readling, MD   175 mcg at 04/03/12 0852  . loperamide (IMODIUM) capsule 2-4 mg  2-4 mg Oral PRN Curlene Labrum Readling, MD      . magnesium hydroxide (MILK OF MAGNESIA) suspension 30 mL  30 mL Oral Daily PRN Curlene Labrum Readling, MD      . methocarbamol (ROBAXIN) tablet 500 mg  500 mg Oral Q8H PRN Curlene Labrum Readling, MD   500 mg at 04/03/12 1112  . multivitamin with minerals tablet 1 tablet  1 tablet Oral Daily Curlene Labrum Readling, MD   1 tablet at 04/03/12 0852  . naproxen (NAPROSYN) tablet 500 mg  500 mg Oral BID PRN Curlene Labrum Readling, MD   500 mg at 04/03/12 1112  . nicotine (NICODERM CQ - dosed in mg/24 hours) patch 21 mg  21 mg Transdermal Q0600 Curlene Labrum Readling, MD   21 mg at 04/03/12 4098  . ondansetron (ZOFRAN-ODT) disintegrating tablet 4 mg  4 mg Oral Q6H PRN Curlene Labrum Readling, MD      . sertraline (ZOLOFT) tablet 100 mg  100 mg Oral Daily Mickie D. Adams, PA   100 mg at 04/03/12 1444  . thiamine (B-1) injection 100 mg  100 mg Intramuscular Once Curlene Labrum Readling, MD      . thiamine (VITAMIN B-1) tablet 100 mg  100 mg Oral Daily Curlene Labrum Readling, MD   100 mg at 04/03/12 0852  . traZODone (DESYREL) tablet 100 mg  100 mg Oral QHS Mickie D. Adams, PA      . DISCONTD: hydrOXYzine (ATARAX/VISTARIL) tablet 25 mg  25 mg Oral Q6H PRN Ronny Bacon, MD      . DISCONTD: levothyroxine (SYNTHROID, LEVOTHROID) tablet 175 mcg  175 mcg Oral Daily Mickie D. Adams, PA      . DISCONTD: loperamide (IMODIUM) capsule 2-4 mg  2-4 mg Oral PRN Ronny Bacon, MD      . DISCONTD: ondansetron (ZOFRAN-ODT) disintegrating tablet 4 mg  4 mg Oral Q6H PRN Ronny Bacon, MD        Facility-Administered Medications Ordered in Other Encounters  Medication Dose Route Frequency Provider Last Rate Last Dose  . gadobenate dimeglumine (MULTIHANCE) injection 15 mL  15 mL Intravenous Once PRN Medication Radiologist, MD   15 mL at 04/02/12 1642  . DISCONTD: acetaminophen (TYLENOL) tablet 650 mg  650 mg Oral Q4H PRN Tatyana A Kirichenko, PA      . DISCONTD: cephALEXin (KEFLEX) capsule 500 mg  500 mg Oral Q6H Frances C. Sanford, PA   500  mg at 04/02/12 2003  . DISCONTD: ibuprofen (ADVIL,MOTRIN) tablet 600 mg  600 mg Oral Q8H PRN Tatyana A Kirichenko, PA   600 mg at 04/02/12 1657  . DISCONTD: levothyroxine (SYNTHROID, LEVOTHROID) tablet 175 mcg  175 mcg Oral Daily Tatyana A Kirichenko, PA      . DISCONTD: LORazepam (ATIVAN) tablet 1 mg  1 mg Oral Q8H PRN Tatyana A Kirichenko, PA      . DISCONTD: nicotine (NICODERM CQ - dosed in mg/24 hours) patch 21 mg  21 mg Transdermal Once Tatyana A Kirichenko, PA   21 mg at 04/02/12 1710  . DISCONTD: ondansetron (ZOFRAN) tablet 4 mg  4 mg Oral Q8H PRN Tatyana A Kirichenko, PA      . DISCONTD: zolpidem (AMBIEN) tablet 5 mg  5 mg Oral QHS PRN Lottie Mussel, PA        Lab Results:  Results for orders placed during the hospital encounter of 04/02/12 (from the past 48 hour(s))  URINE RAPID DRUG SCREEN (HOSP PERFORMED)     Status: Abnormal   Collection Time   04/02/12  1:18 PM      Component Value Range Comment   Opiates POSITIVE (*) NONE DETECTED    Cocaine POSITIVE (*) NONE DETECTED    Benzodiazepines NONE DETECTED  NONE DETECTED    Amphetamines NONE DETECTED  NONE DETECTED    Tetrahydrocannabinol POSITIVE (*) NONE DETECTED    Barbiturates NONE DETECTED  NONE DETECTED   PREGNANCY, URINE     Status: Normal   Collection Time   04/02/12  1:18 PM      Component Value Range Comment   Preg Test, Ur NEGATIVE  NEGATIVE   CBC WITH DIFFERENTIAL     Status: Normal   Collection Time   04/02/12  1:26 PM      Component Value Range Comment   WBC  6.8  4.0 - 10.5 K/uL    RBC 3.89  3.87 - 5.11 MIL/uL    Hemoglobin 12.9  12.0 - 15.0 g/dL    HCT 81.1  91.4 - 78.2 %    MCV 99.7  78.0 - 100.0 fL    MCH 33.2  26.0 - 34.0 pg    MCHC 33.2  30.0 - 36.0 g/dL    RDW 95.6  21.3 - 08.6 %    Platelets 239  150 - 400 K/uL    Neutrophils Relative 65  43 - 77 %    Neutro Abs 4.4  1.7 - 7.7 K/uL    Lymphocytes Relative 21  12 - 46 %    Lymphs Abs 1.5  0.7 - 4.0 K/uL    Monocytes Relative 9  3 - 12 %    Monocytes Absolute 0.6  0.1 - 1.0 K/uL    Eosinophils Relative 5  0 - 5 %    Eosinophils Absolute 0.3  0.0 - 0.7 K/uL    Basophils Relative 0  0 - 1 %    Basophils Absolute 0.0  0.0 - 0.1 K/uL   COMPREHENSIVE METABOLIC PANEL     Status: Abnormal   Collection Time   04/02/12  1:26 PM      Component Value Range Comment   Sodium 137  135 - 145 mEq/L    Potassium 3.5  3.5 - 5.1 mEq/L    Chloride 103  96 - 112 mEq/L    CO2 20  19 - 32 mEq/L    Glucose, Bld 106 (*) 70 - 99 mg/dL  BUN 17  6 - 23 mg/dL    Creatinine, Ser 4.09  0.50 - 1.10 mg/dL    Calcium 9.0  8.4 - 81.1 mg/dL    Total Protein 7.7  6.0 - 8.3 g/dL    Albumin 3.9  3.5 - 5.2 g/dL    AST 21  0 - 37 U/L    ALT 13  0 - 35 U/L    Alkaline Phosphatase 119 (*) 39 - 117 U/L    Total Bilirubin 0.3  0.3 - 1.2 mg/dL    GFR calc non Af Amer >90  >90 mL/min    GFR calc Af Amer >90  >90 mL/min   ETHANOL     Status: Abnormal   Collection Time   04/02/12  1:26 PM      Component Value Range Comment   Alcohol, Ethyl (B) 15 (*) 0 - 11 mg/dL     Physical Findings: AIMS: Facial and Oral Movements Muscles of Facial Expression: None, normal Lips and Perioral Area: None, normal Jaw: None, normal Tongue: None, normal,Extremity Movements Upper (arms, wrists, hands, fingers): None, normal Lower (legs, knees, ankles, toes): None, normal, Trunk Movements Neck, shoulders, hips: None, normal, Overall Severity Severity of abnormal movements (highest score from questions above): None,  normal Incapacitation due to abnormal movements: None, normal Patient's awareness of abnormal movements (rate only patient's report): No Awareness, Dental Status Current problems with teeth and/or dentures?: No Does patient usually wear dentures?: No  CIWA:  CIWA-Ar Total: 0  COWS:  COWS Total Score: 0   Risk: Risk of harm to self is elevated by her depression, anxiety, and addictions  Risk of harm to others is minimal in that she has not been involved in fights or had any legal charges filed on her.  Treatment Plan Summary: Daily contact with patient to assess and evaluate symptoms and progress in treatment Medication management No signs/symptoms of withdrawal from abusable substances. Mood/anxiety less than 3/10 where the scale is 1 is the best and 10 is the worst Pain managed at a 3.  Plan: Admit, place in wheelchair for mobility while foot heals, non narcotic pain management, detox from opiates with clonidine and from alcohol with Librium. We will continue on q. 15 checks the unit protocol. At this time there is no clinical indication for one-to-one observation as patient contract for safety and presents little risk to harm themself and others.  We will increase collateral information. I encourage patient to participate in group milieu therapy. Pt will be seen in treatment team soon for further treatment and appropriate discharge planning. Please see history and physical note for more detailed information ELOS: 3 to 5 days.   Oline Belk 04/03/2012, 4:36 PM

## 2012-04-03 NOTE — Progress Notes (Signed)
[facility] MD Progress Note  04/03/2012 4:04 PM  Diagnosis:   Axis I: Substance Abuse and Substance Induced Mood Disorder Axis II: Deferred Axis III:  Past Medical History  Diagnosis Date  . Thyroid disease   . Cholelithiases    Axis IV: other psychosocial or environmental problems and problems with access to health care services Axis V: 41-50 serious symptoms  ADL's:  Intact  Sleep: Good  Appetite:  Good  Suicidal Ideation:  "I'm not sure if I want to live."  Pt appears sad and quite depressed. Homicidal Ideation:  Pt denies any thoughts, plans, intent of homicide  Mental Status Examination/Evaluation: Objective:  Appearance: Casual  Eye Contact::  Minimal  Speech:  Clear and Coherent  Volume:  Normal  Mood:  Anxious, Depressed, Hopeless, Irritable and Worthless  Affect:  Blunt  Thought Process:  Coherent  Orientation:  Full  Thought Content:  WDL  Suicidal Thoughts:  Yes.  without intent/plan  Homicidal Thoughts:  No  Memory:  Immediate;   Fair Recent;   Fair Remote;   Fair  Judgement:  Impaired  Insight:  Lacking  Psychomotor Activity:  Normal  Concentration:  Fair  Recall:  Fair  Akathisia:  No  Handed:  Right  AIMS (if indicated):     Assets:  Communication Skills Desire for Improvement  Sleep:  Number of Hours: 6.75    Vital Signs:Blood pressure 131/88, pulse 69, temperature 97.9 F (36.6 C), temperature source Oral, resp. rate 16. Current Medications: Current Facility-Administered Medications  Medication Dose Route Frequency Provider Last Rate Last Dose  . acetaminophen (TYLENOL) tablet 650 mg  650 mg Oral Q6H PRN Ronny Bacon, MD   650 mg at 04/03/12 0854  . albuterol (PROVENTIL HFA;VENTOLIN HFA) 108 (90 BASE) MCG/ACT inhaler 1-2 puff  1-2 puff Inhalation Q6H PRN Mickie D. Adams, PA      . alum & mag hydroxide-simeth (MAALOX/MYLANTA) 200-200-20 MG/5ML suspension 30 mL  30 mL Oral Q4H PRN Curlene Labrum Readling, MD      . chlordiazePOXIDE (LIBRIUM) capsule 25  mg  25 mg Oral Q6H PRN Curlene Labrum Readling, MD      . chlordiazePOXIDE (LIBRIUM) capsule 25 mg  25 mg Oral QID Curlene Labrum Readling, MD   25 mg at 04/03/12 1444   Followed by  . chlordiazePOXIDE (LIBRIUM) capsule 25 mg  25 mg Oral TID Ronny Bacon, MD       Followed by  . chlordiazePOXIDE (LIBRIUM) capsule 25 mg  25 mg Oral BH-qamhs Randy D Readling, MD       Followed by  . chlordiazePOXIDE (LIBRIUM) capsule 25 mg  25 mg Oral Daily Curlene Labrum Readling, MD      . chlordiazePOXIDE (LIBRIUM) capsule 50 mg  50 mg Oral Once Ronny Bacon, MD   50 mg at 04/02/12 2240  . cloNIDine (CATAPRES) tablet 0.1 mg  0.1 mg Oral QID Curlene Labrum Readling, MD   0.1 mg at 04/03/12 1445   Followed by  . cloNIDine (CATAPRES) tablet 0.1 mg  0.1 mg Oral BH-qamhs Randy D Readling, MD       Followed by  . cloNIDine (CATAPRES) tablet 0.1 mg  0.1 mg Oral QAC breakfast Curlene Labrum Readling, MD      . dicyclomine (BENTYL) tablet 20 mg  20 mg Oral Q6H PRN Curlene Labrum Readling, MD      . hydrOXYzine (ATARAX/VISTARIL) tablet 25 mg  25 mg Oral Q6H PRN Ronny Bacon, MD      .  levothyroxine (SYNTHROID, LEVOTHROID) tablet 175 mcg  175 mcg Oral QAC breakfast Ronny Bacon, MD   175 mcg at 04/03/12 0852  . loperamide (IMODIUM) capsule 2-4 mg  2-4 mg Oral PRN Curlene Labrum Readling, MD      . magnesium hydroxide (MILK OF MAGNESIA) suspension 30 mL  30 mL Oral Daily PRN Curlene Labrum Readling, MD      . methocarbamol (ROBAXIN) tablet 500 mg  500 mg Oral Q8H PRN Curlene Labrum Readling, MD   500 mg at 04/03/12 1112  . multivitamin with minerals tablet 1 tablet  1 tablet Oral Daily Curlene Labrum Readling, MD   1 tablet at 04/03/12 0852  . naproxen (NAPROSYN) tablet 500 mg  500 mg Oral BID PRN Curlene Labrum Readling, MD   500 mg at 04/03/12 1112  . nicotine (NICODERM CQ - dosed in mg/24 hours) patch 21 mg  21 mg Transdermal Q0600 Curlene Labrum Readling, MD   21 mg at 04/03/12 1610  . ondansetron (ZOFRAN-ODT) disintegrating tablet 4 mg  4 mg Oral Q6H PRN Curlene Labrum Readling, MD      .  sertraline (ZOLOFT) tablet 100 mg  100 mg Oral Daily Mickie D. Adams, PA   100 mg at 04/03/12 1444  . thiamine (B-1) injection 100 mg  100 mg Intramuscular Once Curlene Labrum Readling, MD      . thiamine (VITAMIN B-1) tablet 100 mg  100 mg Oral Daily Curlene Labrum Readling, MD   100 mg at 04/03/12 0852  . traZODone (DESYREL) tablet 100 mg  100 mg Oral QHS Mickie D. Adams, PA      . DISCONTD: hydrOXYzine (ATARAX/VISTARIL) tablet 25 mg  25 mg Oral Q6H PRN Ronny Bacon, MD      . DISCONTD: levothyroxine (SYNTHROID, LEVOTHROID) tablet 175 mcg  175 mcg Oral Daily Mickie D. Adams, PA      . DISCONTD: loperamide (IMODIUM) capsule 2-4 mg  2-4 mg Oral PRN Ronny Bacon, MD      . DISCONTD: ondansetron (ZOFRAN-ODT) disintegrating tablet 4 mg  4 mg Oral Q6H PRN Ronny Bacon, MD       Facility-Administered Medications Ordered in Other Encounters  Medication Dose Route Frequency Provider Last Rate Last Dose  . gadobenate dimeglumine (MULTIHANCE) injection 15 mL  15 mL Intravenous Once PRN Medication Radiologist, MD   15 mL at 04/02/12 1642  . DISCONTD: acetaminophen (TYLENOL) tablet 650 mg  650 mg Oral Q4H PRN Tatyana A Kirichenko, PA      . DISCONTD: cephALEXin (KEFLEX) capsule 500 mg  500 mg Oral Q6H Frances C. Sanford, [place]   500 mg at 04/02/12 2003  . DISCONTD: ibuprofen (ADVIL,MOTRIN) tablet 600 mg  600 mg Oral Q8H PRN Tatyana A Kirichenko, PA   600 mg at 04/02/12 1657  . DISCONTD: levothyroxine (SYNTHROID, LEVOTHROID) tablet 175 mcg  175 mcg Oral Daily Tatyana A Kirichenko, PA      . DISCONTD: LORazepam (ATIVAN) tablet 1 mg  1 mg Oral Q8H PRN Tatyana A Kirichenko, PA      . DISCONTD: nicotine (NICODERM CQ - dosed in mg/24 hours) patch 21 mg  21 mg Transdermal Once Tatyana A Kirichenko, PA   21 mg at 04/02/12 1710  . DISCONTD: ondansetron (ZOFRAN) tablet 4 mg  4 mg Oral Q8H PRN Tatyana A Kirichenko, PA      . DISCONTD: zolpidem (AMBIEN) tablet 5 mg  5 mg Oral QHS PRN Lottie Mussel, PA  Lab  Results:  Results for orders placed during the hospital encounter of 04/02/12 (from the past 48 hour(s))  URINE RAPID DRUG SCREEN (HOSP PERFORMED)     Status: Abnormal   Collection Time   04/02/12  1:18 PM      Component Value Range Comment   Opiates POSITIVE (*) NONE DETECTED    Cocaine POSITIVE (*) NONE DETECTED    Benzodiazepines NONE DETECTED  NONE DETECTED    Amphetamines NONE DETECTED  NONE DETECTED    Tetrahydrocannabinol POSITIVE (*) NONE DETECTED    Barbiturates NONE DETECTED  NONE DETECTED   PREGNANCY, URINE     Status: Normal   Collection Time   04/02/12  1:18 PM      Component Value Range Comment   Preg Test, Ur NEGATIVE  NEGATIVE   CBC WITH DIFFERENTIAL     Status: Normal   Collection Time   04/02/12  1:26 PM      Component Value Range Comment   WBC 6.8  4.0 - 10.5 K/uL    RBC 3.89  3.87 - 5.11 MIL/uL    Hemoglobin 12.9  12.0 - 15.0 g/dL    HCT 16.1  09.6 - 04.5 %    MCV 99.7  78.0 - 100.0 fL    MCH 33.2  26.0 - 34.0 pg    MCHC 33.2  30.0 - 36.0 g/dL    RDW 40.9  81.1 - 91.4 %    Platelets 239  150 - 400 K/uL    Neutrophils Relative 65  43 - 77 %    Neutro Abs 4.4  1.7 - 7.7 K/uL    Lymphocytes Relative 21  12 - 46 %    Lymphs Abs 1.5  0.7 - 4.0 K/uL    Monocytes Relative 9  3 - 12 %    Monocytes Absolute 0.6  0.1 - 1.0 K/uL    Eosinophils Relative 5  0 - 5 %    Eosinophils Absolute 0.3  0.0 - 0.7 K/uL    Basophils Relative 0  0 - 1 %    Basophils Absolute 0.0  0.0 - 0.1 K/uL   COMPREHENSIVE METABOLIC PANEL     Status: Abnormal   Collection Time   04/02/12  1:26 PM      Component Value Range Comment   Sodium 137  135 - 145 mEq/L    Potassium 3.5  3.5 - 5.1 mEq/L    Chloride 103  96 - 112 mEq/L    CO2 20  19 - 32 mEq/L    Glucose, Bld 106 (*) 70 - 99 mg/dL    BUN 17  6 - 23 mg/dL    Creatinine, Ser 7.82  0.50 - 1.10 mg/dL    Calcium 9.0  8.4 - 95.6 mg/dL    Total Protein 7.7  6.0 - 8.3 g/dL    Albumin 3.9  3.5 - 5.2 g/dL    AST 21  0 - 37 U/L    ALT 13  0 -  35 U/L    Alkaline Phosphatase 119 (*) 39 - 117 U/L    Total Bilirubin 0.3  0.3 - 1.2 mg/dL    GFR calc non Af Amer >90  >90 mL/min    GFR calc Af Amer >90  >90 mL/min   ETHANOL     Status: Abnormal   Collection Time   04/02/12  1:26 PM      Component Value Range Comment   Alcohol, Ethyl (B) 15 (*)  0 - 11 mg/dL     Physical Findings: AIMS: Facial and Oral Movements Muscles of Facial Expression: None, normal Lips and Perioral Area: None, normal Jaw: None, normal Tongue: None, normal,Extremity Movements Upper (arms, wrists, hands, fingers): None, normal Lower (legs, knees, ankles, toes): None, normal, Trunk Movements Neck, shoulders, hips: None, normal, Overall Severity Severity of abnormal movements (highest score from questions above): None, normal Incapacitation due to abnormal movements: None, normal Patient's awareness of abnormal movements (rate only patient's report): No Awareness, Dental Status Current problems with teeth and/or dentures?: No Does patient usually wear dentures?: No  CIWA:  CIWA-Ar Total: 0  COWS:  COWS Total Score: 0   Treatment Plan Summary: Daily contact with patient to assess and evaluate symptoms and progress in treatment Medication management No signs/symptoms of withdrawal from abusable substances. Mood/anxiety less than 3/10 where the scale is 1 is the best and 10 is the worst Pain managed at a 3.  Plan: Admit, place in wheelchair for mobility while foot heals, non narcotic pain management, detox from opiates with clonidine and from alcohol with Librium.  Barbara Key 04/03/2012, 4:04 PM

## 2012-04-03 NOTE — Progress Notes (Signed)
Patient ID: Barbara Key, female   DOB: November 29, 1970, 41 y.o.   MRN: [id number]    [facility] Group Notes:  (Counselor/Nursing/MHT/Case Management/Adjunct)  04/03/2012 1:15 PM  Type of Therapy:  Group Therapy, Dance/Movement Therapy   Participation Level:  Did Not Attend      [name]
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
## 2012-04-03 NOTE — Progress Notes (Signed)
D) Pt has had a hard day. Did not attend the groups and only went to the cafeteria for dinner. States that she is in withdrawal and having a very hard time. Was able to get up, shower and stay up for a little bit. Requested a phone number so that she can call her boss and let her her boss know where she is. Rates her depression and hopelessness at a 9. States that she is having passive thoughts of SI. A) Given support and reassurance. Encouraged to drink fluids. Verbal contract made with Pt.  R) Pt has remained safe. Went back to bed to rest after dinner.
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
## 2012-04-03 NOTE — Progress Notes (Signed)
D.  Pt seen in room laying down.  Pt not feeling well, still having some withdrawal and pain from her right foot.  No complaints voiced other than above.  Denies SI/HI/hallucinations at this time.  Took medication without incident.  A.  Medication give as ordered for withdrawal and foot pain.  Support and encouragement offered.  Will continue to monitor. R.  Pt remains in room, snoring respirations, no acute distress noted.

## 2012-04-03 NOTE — Progress Notes (Signed)
Patient ID: Barbara Key, female   DOB: 1971-05-06, 41 y.o.   MRN: [id number]  Pt. attended and participated in aftercare planning group. Pt. verbally accepted information on suicide prevention, warning signs to look for with suicide and crisis line numbers to use. Pt. listed their current anxiety level as 6 and their current depression level as 10.

## 2012-04-03 NOTE — Progress Notes (Signed)
Psychoeducational Group Note  Date:  04/03/2012 Time:  1015  Group Topic/Focus:  Identifying Needs:   The focus of this group is to help patients identify their personal needs that have been historically problematic and identify healthy behaviors to address their needs.  Participation Level:  Active  Participation Quality:  Appropriate  Affect:  Appropriate  Cognitive:  Alert and Appropriate  Insight:  Good  Engagement in Group:  Good  Additional Comments:  Was engaged in the group  [name]
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
## 2012-04-03 NOTE — Progress Notes (Signed)
Psychoeducational Group Note  Date:  04/03/2012 Time:  1515  Group Topic/Focus:  Healthy Communication:   The focus of this group is to discuss communication, barriers to communication, as well as healthy ways to communicate with others.  Participation Level:  Active  Participation Quality:  Appropriate, Attentive, Sharing and Supportive  Affect:  Appropriate  Cognitive:  Appropriate  Insight:  Good  Engagement in Group:  Good  Additional Comments:  Pt identified unhealthy and healthy coping skills. Pt gave an example of negative and positive ways to communicate (gestures, words, tone of voice, and body language. Pt role play with unhealthy way and negative way to communicate. Pt was supportive, and shared information during group. Pt stated one way to communicate in a healthy way upon discharge is communicate better with family by not screaming, and telling the truth to family.    Karleen Hampshire Brittini 04/03/2012, 6:48 PM

## 2012-04-04 MED ORDER — GABAPENTIN 300 MG PO CAPS
300.0000 mg | ORAL_CAPSULE | Freq: Three times a day (TID) | ORAL | Status: DC
  Administered 2012-04-04 – 2012-04-05 (×6): 300 mg via ORAL
  Filled 2012-04-04 (×13): qty 1

## 2012-04-04 NOTE — Progress Notes (Signed)
Barbara Key is a 41 y.o. female [id number] 1971/05/22  04/02/2012 Principal Problem:  *Polysubstance dependence including opioid type drug, episodic abuse Active Problems:  Hypothyroid  Drug-induced mood disorder   Mental Status: Sleepy denies SI/HI/AVH  Subjective/Objective: Says she has no energy her body is weak and she is dizzy when she stands up.In active withdrawal.    Filed Vitals:   04/04/12 1101  BP: 97/62  Pulse: 80  Temp:   Resp:     Lab Results:   BMET    Component Value Date/Time   NA 137 04/02/2012 1326   K 3.5 04/02/2012 1326   CL 103 04/02/2012 1326   CO2 20 04/02/2012 1326   GLUCOSE 106* 04/02/2012 1326   BUN 17 04/02/2012 1326   CREATININE 0.71 04/02/2012 1326   CALCIUM 9.0 04/02/2012 1326   GFRNONAA >90 04/02/2012 1326   GFRAA >90 04/02/2012 1326    Medications:  Scheduled:     . amitriptyline  25 mg Oral QHS  . chlordiazePOXIDE  25 mg Oral QID   Followed by  . chlordiazePOXIDE  25 mg Oral TID   Followed by  . chlordiazePOXIDE  25 mg Oral BH-qamhs   Followed by  . chlordiazePOXIDE  25 mg Oral Daily  . cloNIDine  0.1 mg Oral QID   Followed by  . cloNIDine  0.1 mg Oral BH-qamhs   Followed by  . cloNIDine  0.1 mg Oral QAC breakfast  . DULoxetine  20 mg Oral BID  . gabapentin  300 mg Oral TID WC & HS  . ibuprofen  800 mg Oral QID  . levothyroxine  175 mcg Oral QAC breakfast  . multivitamin with minerals  1 tablet Oral Daily  . nicotine  21 mg Transdermal Q0600  . pantoprazole  20 mg Oral BID AC  . thiamine  100 mg Intramuscular Once  . thiamine  100 mg Oral Daily  . traZODone  100 mg Oral QHS  . DISCONTD: gabapentin  300 mg Oral QID  . DISCONTD: sertraline  100 mg Oral Daily     PRN Meds albuterol, alum & mag hydroxide-simeth, chlordiazePOXIDE, dicyclomine, hydrOXYzine, loperamide, magnesium hydroxide, methocarbamol, naproxen, ondansetron, DISCONTD: acetaminophen  Plan: continue current plan of care no med changes. Noora Locascio,MICKIE  D. 04/04/2012    Barbara Key is a 41 y.o. female [id number] April 03, 1971  04/02/2012 Principal Problem:  *Polysubstance dependence including opioid type drug, episodic abuse Active Problems:  Hypothyroid  Drug-induced mood disorder   Mental Status:    Subjective/Objective:    Filed Vitals:   04/04/12 1101  BP: 97/62  Pulse: 80  Temp:   Resp:     Lab Results:   BMET    Component Value Date/Time   NA 137 04/02/2012 1326   K 3.5 04/02/2012 1326   CL 103 04/02/2012 1326   CO2 20 04/02/2012 1326   GLUCOSE 106* 04/02/2012 1326   BUN 17 04/02/2012 1326   CREATININE 0.71 04/02/2012 1326   CALCIUM 9.0 04/02/2012 1326   GFRNONAA >90 04/02/2012 1326   GFRAA >90 04/02/2012 1326    Medications:  Scheduled:     . amitriptyline  25 mg Oral QHS  . chlordiazePOXIDE  25 mg Oral QID   Followed by  . chlordiazePOXIDE  25 mg Oral TID   Followed by  . chlordiazePOXIDE  25 mg Oral BH-qamhs   Followed by  . chlordiazePOXIDE  25 mg Oral Daily  . cloNIDine  0.1 mg Oral QID   Followed by  .  cloNIDine  0.1 mg Oral BH-qamhs   Followed by  . cloNIDine  0.1 mg Oral QAC breakfast  . DULoxetine  20 mg Oral BID  . gabapentin  300 mg Oral TID WC & HS  . ibuprofen  800 mg Oral QID  . levothyroxine  175 mcg Oral QAC breakfast  . multivitamin with minerals  1 tablet Oral Daily  . nicotine  21 mg Transdermal Q0600  . pantoprazole  20 mg Oral BID AC  . thiamine  100 mg Intramuscular Once  . thiamine  100 mg Oral Daily  . traZODone  100 mg Oral QHS  . DISCONTD: gabapentin  300 mg Oral QID  . DISCONTD: sertraline  100 mg Oral Daily     PRN Meds albuterol, alum & mag hydroxide-simeth, chlordiazePOXIDE, dicyclomine, hydrOXYzine, loperamide, magnesium hydroxide, methocarbamol, naproxen, ondansetron, DISCONTD: acetaminophen   Skylyn Slezak,MICKIE D. 04/04/2012

## 2012-04-04 NOTE — Progress Notes (Signed)
Psychoeducational Group Note  Date:  04/03/2012 Time:  1015  Group Topic/Focus:  Making Healthy Choices:   The focus of this group is to help patients identify negative/unhealthy choices they were using prior to admission and identify positive/healthier coping strategies to replace them upon discharge.  Participation Level:  Minimal  Participation Quality:  sleeping  Affect:  Flat  Cognitive:  Appropriate  Insight:  Limited  Engagement in Group:  Limited  Additional Comments:  Pt slept throughout the group except in the very beginning answered a question with her eyes closed  Dione Housekeeper 04/03/2012, 8:27 AM

## 2012-04-04 NOTE — Progress Notes (Signed)
D) Pt attended one group this morning and slept through it. States she is having discomfort in her foot and all over her body r/t withdrawal. Rates her depression at an 8 and her hopelessness at a 9. States she is having SI on and off throughout the day. Worried about where she will go when she leaves the hospital A) Given fluids throughout the day. Encouraged to go to the groups and to bathe. Verbal contract made with Pt for her safety. R). Pt has stayed in her bed most of the day. States that she is just feeling horrible and wants to rest. Taking Motrin for the pain in her foot.

## 2012-04-04 NOTE — Progress Notes (Signed)
D.  Pt did not attend evening AA group due to stomach pain, but did get up around 2200 and went to the dayroom for a snack.  More alert and interactive this evening, brighter affect.  Denies SI/HI/hallucinations.  Withdrawal less severe this evening.   A.  Support and encouragement offered, encouraged group participation.  Will continue to monitor.  R.  Sitting in dayroom in no acute distress watching TV with peers and eating snack.

## 2012-04-04 NOTE — Progress Notes (Signed)
Brief Nutrition Note  Patient identified on the Nutrition Risk Report for weight loss  Pt sleeping, eating well per tech.  Appears obese.  No height or weight available.  No weight hx available.  Current diet order is regular with good intake of meals.  No further nutrition interventions warranted at this time. If additional nutrition issues arise, please re-consult RD.   Oran Rein, RD [phone]

## 2012-04-04 NOTE — Progress Notes (Signed)
Patient ID: Barbara Key, female   DOB: 1971-03-04, 41 y.o.   MRN: [id number] Pt. did not attend. Pt. was in room sleeping.

## 2012-04-04 NOTE — Progress Notes (Signed)
Patient ID: LAELYNN BLIZZARD, female   DOB: 07/18/1971, 41 y.o.   MRN: [id number]  [facility] Group Notes:  (Counselor/Nursing/MHT/Case Management/Adjunct)  04/04/2012 1:15 PM  Type of Therapy:  Group Therapy, Dance/Movement Therapy   Participation Level:  Did Not Attend  Pt indicated that she was not feeling well.   Rhunette Croft

## 2012-04-05 MED ORDER — GABAPENTIN 100 MG PO CAPS
200.0000 mg | ORAL_CAPSULE | Freq: Three times a day (TID) | ORAL | Status: DC
  Administered 2012-04-05 – 2012-04-07 (×8): 200 mg via ORAL
  Filled 2012-04-05 (×11): qty 2

## 2012-04-05 NOTE — Progress Notes (Signed)
Pt reported her sleep as fair that she woke up twice last night but was able to fall back to sleep.  She reported her energy as low and ability as poor but as day has progressed she reports that is getting better for her.  She rated both her depression and hopelessness a 9 and her anxiety a 8 on her self-inventory.  She checked sedation diarrhea chilling and cravings on her withdrawal levels.  Although her CIWA has been 2 and below today.  She still has pain on her right foot from past surgery that she takes scheduled ibuprofen.  She is trying to get into an [facility] from here and try to go to [facility].  She has been getting too close to a female peer and she was talked to about boundaries and what she needed to do.  For example not to sit with pt in dayroom or cafeteria.  She did voice understanding.  She was tearful for she feels she has lost everyone in her life over her usage.  She denied any S/H ideation or A/V hallucinations.  She did c/o of light-headed and so some of her meds were decreased and we discussed the need to change positions slowly.  She did voice understanding.

## 2012-04-05 NOTE — Treatment Plan (Signed)
Interdisciplinary Treatment Plan Update (Adult)  Date: 04/05/2012  Time Reviewed: 10:58 AM   Progress in Treatment: Attending groups: Yes Participating in groups: Yes Taking medication as prescribed: Yes Tolerating medication: Yes   Family/Significant other contact made: Not yet  Patient understands diagnosis:  Yes  As evidenced by asking for help with alcohol detox Discussing patient identified problems/goals with staff:  Yes  See below Medical problems stabilized or resolved:  Yes  Denies suicidal/homicidal ideation: Yes In tx team Issues/concerns per patient self-inventory:  Yes  Depression, hopelessness and anxiety are all 8 or higher.  C/O lightheadedness, dizziness, blurred vision and foot pain Other:  New problem(s) identified: N/A  Reason for Continuation of Hospitalization: Anxiety Depression Medication stabilization Withdrawal symptoms  Interventions implemented related to continuation of hospitalization:   Librium taper,  Medication stabilization, encourage group  attendance and participation  Additional comments: C/M to get records from [facility] stay  Estimated length of stay:2-3 days  Discharge Plan: Go to [facility] house, follow up at IOP  New goal(s): N/A  Review of initial/current patient goals per problem list:   1.  Goal(s): Detox from alcohol and Heroin  Met:  No  Target date:7/9  As evidenced ZO:XWRUEA vitals, no withdrawal symptoms  2.  Goal (s):Stabilize mood  Met:  No  Target date:7/11  As evidenced by:   3.  Goal(s):Eliminate SI  Met:  Yes  Target date:7/8  As evidenced VW:UJWJ report  4.  Goal(s): Identify comprehensive sobriety plan  Met:  Yes  Target date:7/8  As evidenced XB:JYNWG states she will secure an [employer] bed and attend [facility] IOP  Attendees: Patient:  Barbara Key 04/05/2012 10:58 AM  Family:     Physician:  Lupe Carney 04/05/2012 10:58 AM   Nursing: Robbie Louis   04/05/2012 10:58 AM   Case  Manager:  Richelle Ito, LCSW 04/05/2012 10:58 AM   Counselor:  Ronda Fairly, LCSWA 04/05/2012 10:58 AM   Other:     Other:     Other:     Other:      Scribe for Treatment Team:   Ida Rogue, 04/05/2012 10:58 AM

## 2012-04-05 NOTE — Progress Notes (Signed)
[facility] Group Notes:  (Counselor/Nursing/MHT/Case Management/Adjunct)  04/05/2012 1:15 PM  Type of Therapy:  Psychoeducational Skills  Participation Level:  Active  Participation Quality:  Appropriate  Affect:  Appropriate  Cognitive:  Appropriate  Insight:  Good  Engagement in Group:  Good  Engagement in Therapy:  Good  Modes of Intervention:  Support  Summary of Progress/Problems: Pt stated she wanted to restore her emotional wellness and learn how to mange her feelings while here.   Christ Kick 04/05/2012, 1:15 PM

## 2012-04-05 NOTE — Progress Notes (Signed)
[facility] MD Progress Note  04/05/2012 3:32 PM  S/O: Patient seen and evaluated. Chart reviewed. Patient stated that her mood was "not good". Her affect was mood congruent and constricted.  Pt was at [facility] for 90 Day Extended Tx Program in 2012. She denied any current thoughts of self injurious behavior, suicidal ideation or homicidal ideation. There were no auditory or visual hallucinations, paranoia, delusional thought processes, or mania noted.  Thought process was linear and goal directed. Speech was normal rate, tone and volume. Eye contact was fair.  Pt c/o sedation on current meds, willing to taper down on gabapentin and d/c Elavil. Judgment and insight are limited.  Patient has been up and limitedly engaged on the unit.  No acute safety concerns reported from team.    Sleep:  Number of Hours: 6.5    Vital Signs:Blood pressure 121/86, pulse 76, temperature 98.3 F (36.8 C), temperature source Oral, resp. rate 16.  Lab Results:  Results for orders placed during the hospital encounter of 04/02/12 (from the past 48 hour(s))  VITAMIN D 25 HYDROXY     Status: Normal   Collection Time   04/03/12  7:34 PM      Component Value Range Comment   Vit D, 25-Hydroxy 35  30 - 89 ng/mL     Physical Findings: COWS:  COWS Total Score: 1   A/P: Polysubstance Dependence; Alcohol and Opioid W/D; GAD; Hypothyroidism; GERD; Asthma; Chronic Pain; r/o Sleep Apnea; SIMD  Pt being detoxed off opiates and alcohol.  Tapers initiated upon admission.  Collateral requested from [facility] regarding D/C Summary & Meds.  Decreased dose of gabapentin and d/c Elavil in light of pt's sedation and c/o dizziness.  Will follow.  Further med adjustments to follow.  VSS.  Pt agreeable with plan.  Dispo pending.    Lupe Carney 04/05/2012, 3:32 PM

## 2012-04-05 NOTE — Progress Notes (Signed)
Pt attended group and was active and appropriate. Pt played the card game: apple to apples with staff and other pts. 

## 2012-04-05 NOTE — Discharge Planning (Signed)
New patient attended AM group, good participation.  Here for alcohol, opiate detox.  Was in [facility] 1.5 yrs ago for 90 days, then returned home.  Sober for 8 mos.  Says her demise is the fact that she lives with father who is alcoholic.  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  Was interested in rehab, but does not want to go to [facility] because it is in [place], and does not have money for self pay like [facility].  Decided she would go to [facility] and follow up outpt.

## 2012-04-05 NOTE — Progress Notes (Addendum)
[facility] Adult Inpatient Family/Significant Other Suicide Prevention Education  Suicide Prevention Education:  Contact Attempts:Patient's husband, Phillip Sandler at [phone] has been identified by the patient as the family member/significant other with whom the patient will be residing, and identified as the person(s) who will aid the patient in the event of a mental health crisis.  With written consent from the patient, two attempts were made to provide suicide prevention education, prior to and/or following the patient's discharge.  We were unsuccessful in providing suicide prevention education.  A suicide education pamphlet was given to the patient to share with family/significant other.  Date and time of first attempt:04/05/2012 5:40 PM  Date and time of second attempt: NOT ATTEMPTED as further reading in patient's chart states left patient two two years ago; Clinical research associate sought pt on hall to confer with as to who should receive suicide prevention education. Patient population is currently outside, will confer tomorrow  Clide Dales 04/05/2012, 5:42 PM

## 2012-04-05 NOTE — Progress Notes (Signed)
[facility] Group Notes:  (Counselor/Nursing/MHT/Case Management/Adjunct)  04/06/2012 9:10 AM  Type of Therapy:  Group Therapy from 1:15 to 2:30  Participation Level:  None  Participation Quality:  Asleep  Affect:  Flat  Cognitive:  Oriented  Insight:  None shared  Engagement in Group:  None  Engagement in Therapy:  None  Modes of Intervention:  Limit-setting and Orientation  Summary of Progress/Problems:  Group discussion focused on what patient's see as their own obstacles to recovery.  Patient appeared drowsy during orientation for group was "awaken" twice and ultimately excused from group to go to bed.  Patient expressed gratitude to be able to return to her room. Clide Dales 04/06/2012, 9:10 AM

## 2012-04-06 MED ORDER — QUETIAPINE FUMARATE 25 MG PO TABS
25.0000 mg | ORAL_TABLET | Freq: Once | ORAL | Status: AC
  Administered 2012-04-06: 25 mg via ORAL
  Filled 2012-04-06: qty 1

## 2012-04-06 MED ORDER — QUETIAPINE FUMARATE 25 MG PO TABS
12.5000 mg | ORAL_TABLET | Freq: Two times a day (BID) | ORAL | Status: DC
  Administered 2012-04-06 – 2012-04-07 (×2): 12.5 mg via ORAL
  Filled 2012-04-06 (×4): qty 1

## 2012-04-06 NOTE — Discharge Planning (Signed)
Barbara Key has an interview set up for Friday early afternoon at [facility].  She plans to call her father to see if she can stay with him on Thursday night and get a ride from him to [facility] on Friday.  She says d/c on Thurs would help her anxiety as she would not be worried about her d/c time on Friday.

## 2012-04-06 NOTE — Progress Notes (Signed)
D: Pt. Reports anxiety at "8", Pt. Reports stress "no place to go", reports passive suicidal thoughts at times,but no plan. Writer notes tremors, pt. Does report a desire to get clean. A: monitor for safety q5min, writer encourage pt. To follow through with treatment, this time. R: Pt. Remains safe on the unit, pt. Wants to continue tx, realizes ramifications of addiction "I've lost everything, my family, my friends." Medications to given to help with s/s of detox.

## 2012-04-06 NOTE — Progress Notes (Signed)
[facility] MD Progress Note  04/06/2012 1:29 PM  S/O: Patient seen and evaluated. Chart reviewed. Patient stated that her mood was "not good". Her affect was mood congruent and anxious.  Pt was at [facility] for 90 Day Extended Tx Program in 2012. She denied any current thoughts of self injurious behavior, suicidal ideation or homicidal ideation. There were no auditory or visual hallucinations, paranoia, delusional thought processes, or mania noted.  Thought process was linear and goal directed. Speech was normal rate, tone and volume. Eye contact was fair.  Judgment and insight are limited.  Patient has been up and limitedly engaged on the unit.  Current c/o CP with numbness to left arm/hand.  No reported N/V/HA/D/SOB.  EKG pending.    Sleep:  Number of Hours: 5.75    Vital Signs:Blood pressure 120/82, pulse 71, temperature 98.3 F (36.8 C), temperature source Oral, resp. rate 16.  Lab Results:  No results found for this or any previous visit (from the past 48 hour(s)).  Physical Findings: COWS:  COWS Total Score: 4   A/P: Polysubstance Dependence; Alcohol and Opioid W/D; GAD; Hypothyroidism; GERD; Asthma; Chronic Pain; r/o Sleep Apnea; SIMD  Pt being detoxed off opiates and alcohol.  Tapers initiated upon admission.  Collateral requested from [facility] regarding D/C Summary & Meds.  Pt was on:  Synthroid qd Trazodone 150mg  qhs DuoNeb MDI Robaxin Mirtazapine 15mg  qhs Paroxetine 60mg  qd Buspar 30mg  bid Inderal 40mg  qd  Pt elected to start Cymbalta over Paxil upon admission for help with chronic pain as well.  Continued Trazodone.  Pt did not want to continue Buspar or Inderal and is willing to try low dose Seroquel for continued anxiety.  EKG pending stat now.  Will follow.   Kuroski-Mazzei, Charae Depaolis 04/06/2012, 1:29 PM

## 2012-04-06 NOTE — Progress Notes (Signed)
D: pt. Was very tearful & upset when she got word that her son was in an induced coma @ the hospital. Pt. Was also told that he was intubated  &  going to ICU the patient.Son had stopped his seizure medications & had a seizure.pt    was on the phone & was getting frantic.The 5 pm vs was missed due to this .A: The AC made arrangements  for pt to be escorted to the hospital to visit her son.A Dr,s order was obtained. R: pt now much calmer &   Feels better now that she saw her son.Continues on 15 minute checks. Pt safety maintained.

## 2012-04-06 NOTE — Progress Notes (Signed)
[facility] Group Notes:  (Counselor/Nursing/MHT/Case Management/Adjunct)  04/06/2012   Type of Therapy:  Group Therapy 1:15 to 2:30 on Tuesday 04/06/12  Participation Level:  Minimal; patient only attended 30 of the 75 minute group  Participation Quality:  Attentive  Affect:  Flat  Cognitive:  Oriented  Insight:  None Shared  Engagement in Group:  Limited  Engagement in Therapy:  Limited  Modes of Intervention:  Clarification, Socialization and Support  Summary of Progress/Problems: Patient did not attend group presentation by director of  [employer] ([employer]).  Remainder of group time was spent processing feelings about diagnosis and incorporating diagnosis into self concept.  Patient agreed with another patient that shared fear of using substances to cope is due to a deeper greater problem.   Clide Dales

## 2012-04-06 NOTE — Progress Notes (Signed)
04/06/2012         Time: 1500      Group Topic/Focus: The focus of this group is on discussing various aspects of wellness, balancing those aspects and exploring ways to increase the ability to experience wellness.   Participation Level: Active  Participation Quality: Appropriate and Attentive  Affect: Appropriate  Cognitive: Oriented   Additional Comments: None.    Barbara Key 04/06/2012 3:42 PM

## 2012-04-06 NOTE — Progress Notes (Signed)
[facility] Group Notes:  (Counselor/Nursing/MHT/Case Management/Adjunct)  04/06/2012 11:33 AM  Type of Therapy:  Psychoeducational Skills  Participation Level:  Did Not Attend   Summary of Progress/Problems: Ellisyn c/o feeling anxious and did not attend Psychoeducation group on using quality time with support systems/individuals as a healthy coping skill.    Wandra Scot 04/06/2012, 11:33 AM

## 2012-04-06 NOTE — Progress Notes (Signed)
[facility] Group Notes:  (Counselor/Nursing/MHT/Case Management/Adjunct)  04/06/2012 10:04 PM  Type of Therapy:  Psychoeducational Skills  Participation Level:  Minimal  Participation Quality:  Appropriate  Affect:  Appropriate  Cognitive:  Appropriate  Insight:  Good  Engagement in Group:  Good  Engagement in Therapy:  Good  Modes of Intervention:  Education  Summary of Progress/Problems: Pt attended NA/AA group this evening and participated to a minimum.   Deshana Rominger L 04/06/2012, 10:04 PM

## 2012-04-07 ENCOUNTER — Encounter
Payer: Managed Care, Other (non HMO) | Attending: Physical Medicine & Rehabilitation | Admitting: Physical Medicine & Rehabilitation

## 2012-04-07 MED ORDER — GABAPENTIN 100 MG PO CAPS: 200.0000 mg | ORAL_CAPSULE | Freq: Three times a day (TID) | ORAL | Status: DC

## 2012-04-07 MED ORDER — ALBUTEROL SULFATE HFA 108 (90 BASE) MCG/ACT IN AERS: 1.0000 | INHALATION_SPRAY | Freq: Four times a day (QID) | RESPIRATORY_TRACT | Status: DC | PRN

## 2012-04-07 MED ORDER — TRAZODONE HCL 100 MG PO TABS: 100.0000 mg | ORAL_TABLET | Freq: Every day | ORAL | Status: DC

## 2012-04-07 MED ORDER — LEVOTHYROXINE SODIUM 175 MCG PO TABS: 175.0000 ug | ORAL_TABLET | Freq: Every day | ORAL | Status: DC

## 2012-04-07 MED ORDER — QUETIAPINE 12.5 MG HALF TABLET: 12.5000 mg | ORAL_TABLET | Freq: Two times a day (BID) | ORAL | Status: DC

## 2012-04-07 MED ORDER — DULOXETINE HCL 20 MG PO CPEP: 20.0000 mg | ORAL_CAPSULE | Freq: Two times a day (BID) | ORAL | Status: DC

## 2012-04-07 MED ORDER — PANTOPRAZOLE SODIUM 20 MG PO TBEC: 20.0000 mg | DELAYED_RELEASE_TABLET | Freq: Two times a day (BID) | ORAL | Status: DC

## 2012-04-07 NOTE — Treatment Plan (Signed)
Interdisciplinary Treatment Plan Update (Adult)  Date: 04/07/2012  Time Reviewed: 1:34 PM   Progress in Treatment: Attending groups: Yes Participating in groups: Yes Taking medication as prescribed: Yes Tolerating medication: Yes   Family/Significant othe contact made:   Patient understands diagnosis:  Yes Discussing patient identified problems/goals with staff:  Yes Medical problems stabilized or resolved:  Yes Denies suicidal/homicidal ideation: Yes  In tx team Issues/concerns per patient self-inventory:  Yes  Request for d/c Other:  New problem(s) identified: N/A  Reason for Continuation of Hospitalization: Other; describe D/C today  Interventions implemented related to continuation of hospitalization:   Additional comments:  Estimated length of stay:  Discharge Plan: see below  New goal(s): N/A  Review of initial/current patient goals per problem list:   1.  Goal(s): Detox  Met:  Yes  Target date:7/10  As evidenced ZO:XWRUEA vitals, no withdrawal symptoms  2.  Goal (s):Stabilize mood  Met:  Yes  Target date:7/10  As evidenced VW:UJWJX denies depression and anxiety today  3.  Goal(s): Eliminate SI  Met:  Yes  Target date:See previous tx plan  As evidenced by:  4.  Goal(s): Identify comprehensive sobriety plan  Met:  Yes  Target date:7/10  As evidenced by: In addition to plan outlined in previous tx pan, Aaliyana plans to follow up [facility]  Attendees: Patient:  Barbara Key 04/07/2012 1:34 PM  Family:     Physician:  Lupe Carney 04/07/2012 1:34 PM   Nursing:Vivian Kent    04/07/2012 1:34 PM   Case Manager:  Richelle Ito, LCSW 04/07/2012 1:34 PM   Counselor:  Ronda Fairly, LCSWA 04/07/2012 1:34 PM   Other:     Other:     Other:     Other:      Scribe for Treatment Team:   Ida Rogue, 04/07/2012 1:34 PM

## 2012-04-07 NOTE — [facility] Suicide Risk Assessment (Signed)
Suicide Risk Assessment  Discharge Assessment      Demographic factors: Caucasian  Current Mental Status Per Nursing Assessment:  On Admission:  Suicidal ideation indicated by patient At Discharge: Pt denied any SI/HI/thoughts of self harm or acute psychiatric issues in treatment team with clinical, nursing and medical team present.  Current Mental Status Per Physician: Patient seen and evaluated. Chart reviewed. Patient stated that her mood was "much better". Her affect was mood congruent and less anxious. Pt was at [facility] for 90 Day Extended Tx Program in 2012. She denied any current thoughts of self injurious behavior, suicidal ideation or homicidal ideation. There were no auditory or visual hallucinations, paranoia, delusional thought processes, or mania noted. Thought process was linear and goal directed. Speech was normal rate, tone and volume. Eye contact was fair. Judgment and insight are limited. Patient has been up and limitedly engaged on the unit. Visited her son at [facility] yesterday with staff and was very appropriate and thankful for the visit.  Pt requesting to be discharged today to family.    Loss Factors: Financial problems / change in socioeconomic status  Historical Factors: Prior suicide attempts  Risk Reduction Factors: Living with another person, especially a relative  Discharge Diagnoses: Polysubstance Dependence; Alcohol and Opioid W/D, resolved; GAD; Hypothyroidism; GERD; Asthma; Chronic Pain; r/o Sleep Apnea; SIMD, resolving  Cognitive Features That Contribute To Risk: limited insight; impulsivity.  Suicide Risk: Pt viewed as a chronic increased risk of harm to self in light of her past hx and risk factors.  No acute safety concerns noted on the unit.  Pt contracting for safety and stable to be discharged to her father's and then placement is planned for [facility] on Friday.    Plan Of Care/Follow-up recommendations: Pt seen and evaluated in treatment team. Chart  reviewed.  Pt stable for and requesting discharge. Pt contracting for safety and does not currently meet [facility] involuntary commitment criteria for continued hospitalization against her will.  Mental health treatment, medication management and continued sobriety will mitigate against the increased risk of harm to self and/or others.  Discussed the importance of recovery further with pt, as well as, tools to move forward in a healthy & safe manner.  Pt agreeable with the plan.  Discussed with the team.  Please see orders, follow up appointments per AVS ([facility]) and full discharge summary to be completed by physician extender.  Recommend follow up with AA/NA.  Diet: Regular.  Activity: As tolerated.     Barbara Key 04/07/2012, 10:43 AM

## 2012-04-07 NOTE — Progress Notes (Addendum)
D: Pt in bed resting with eyes closed. Pt heard snoring. Pt appears to be in no signs of distress at this time. A: Q42min checks remains for this pt. R: Pt remains safe at this time.

## 2012-04-07 NOTE — Progress Notes (Signed)
[facility] Case Management Discharge Plan:  Will you be returning to the same living situation after discharge: No. At discharge, do you have transportation home?:Yes,  father Do you have the ability to pay for your medications:Yes,  insurance  Interagency Information:     Release of information consent forms completed and in the chart;  Patient's signature needed at discharge.  Patient to Follow up at:  Follow-up Information    Follow up with [facility] on 04/12/2012. (9:00AM with Viviann [facility]  Call to reschedule if this does not work for you, or you will be billed)    Contact information:   [address]  [place]  [phone]         Patient denies SI/HI:   Yes,  yes    Safety Planning and Suicide Prevention discussed:  Yes,  yes  Barrier to discharge identified:No.  Summary and Recommendations:   Barbara Key 04/07/2012, 10:44 AM

## 2012-04-07 NOTE — Progress Notes (Signed)
[facility] Family/Significant Other Suicide Prevention Education  Suicide Prevention Education:  Education Field seismologist provided suicide prevention education directly to patient; conversation included risk factors, warning signs and resources to contact for help. Mobile crisis services explained and contact card placed in chart for pt to receive at discharge.   Clide Dales 04/07/2012, 10:08 AM

## 2012-04-07 NOTE — Discharge Summary (Signed)
Physician Discharge Summary Note  Patient:  Barbara Key is an 41 y.o., female MRN:  [id number] DOB:  September 15, 1971 Patient phone:  [phone] (home)  Patient address:   [address] [place] [address]   Date of Admission:  04/02/2012 Date of Discharge: 04/07/2012  Discharge Diagnoses: Principal Problem:  *Polysubstance dependence including opioid type drug, episodic abuse Active Problems:  Drug-induced mood disorder  Hypothyroid  Axis Diagnosis:  Discharge Diagnoses: Polysubstance Dependence; Alcohol and Opioid W/D, resolved; GAD; Hypothyroidism; GERD; Asthma; Chronic Pain; r/o Sleep Apnea; SIMD, resolving   Level of Care:  outpatient  Hospital Course:   The patient presented to [facility] requesting detox from opiates, but then changed her mind. After much back and forth the patient elected to continue at [facility] for opiate detox.  She was initiated on the clonidine protocol for detox and librium detox protocol was also added to cover her abuse of benzodiazepines.  Angelize was evaluated daily by a clinical provider.   Her response to detox was monitored by both CIWA and COWS scores.  Khamille's mental and emotional status was monitored by daily self inventories she completed each day. Averleigh's symptoms were managed with Cymbalta for neuropathic pain and anxiety as well as depressive symptoms.  Her withdrawal was significant enough that she was placed on naltrexone for cravings and Seroquel for anxiety. On the fifth day of admission Pualani reported no further withdrawal symptoms and requested discharge to return home with her father.  She was to follow up at the [facility] for further treatment upon discharge.  Follow-up Information    Follow up with [facility] on 04/12/2012. (9:00AM with Viviann Spare [facility]  Call to reschedule if this does not work for you, or you will be billed)    Contact information:   [address]  [place]  [phone]        Consults:   none  Significant Diagnostic Studies:  none Discharge Vitals:   Blood pressure 128/90, pulse 94, temperature 98 F (36.7 C), temperature source Oral, resp. rate 16..  Mental Status Exam: See Mental Status Examination and Suicide Risk Assessment completed by Attending Physician prior to discharge.  Discharge destination:  Home to father  Is patient on multiple antipsychotic therapies at discharge:  No  Has Patient had three or more failed trials of antipsychotic monotherapy by history: N/A Recommended Plan for Multiple Antipsychotic Therapies: N/A Discharge Orders    Future Appointments: Provider: Department: Dept Phone: Center:   04/07/2012 11:20 AM Ranelle Oyster, MD [facility] [phone] [facility]     Medication List  As of 04/07/2012 10:59 AM   ASK your doctor about these medications      Indication    albuterol 108 (90 BASE) MCG/ACT inhaler   Commonly known as: PROVENTIL HFA;VENTOLIN HFA   Inhale 1-2 puffs into the lungs every 6 (six) hours as needed for wheezing.       levothyroxine 175 MCG tablet   Commonly known as: SYNTHROID, LEVOTHROID   Take 175 mcg by mouth daily.       sertraline 100 MG tablet   Commonly known as: ZOLOFT   Take 100 mg by mouth daily.       traZODone 100 MG tablet   Commonly known as: DESYREL   Take 100 mg by mouth at bedtime.            Follow-up Information    Follow up with [facility] on 04/12/2012. (  9:00AM with Viviann Spare [facility]  Call to reschedule if this does not work for you, or you will be billed)    Contact information:   [address]  [place]  [phone]        Follow-up recommendations:   Activities: Resume typical activities Diet: Resume typical diet Other: Follow up with outpatient provider and report any side effects to out patient prescriber.  Comments:  Take all your medications as prescribed by your mental healthcare provider. Report any adverse effects and or reactions from your medicines to your  outpatient provider promptly. Patient is instructed and cautioned to not engage in alcohol and or illegal drug use while on prescription medicines. In the event of worsening symptoms, patient is instructed to call the crisis hotline, 911 and or go to the nearest ED for appropriate evaluation and treatment of symptoms.  Signed: Rona Ravens. Sotero Brinkmeyer PAC For Dr. Lupe Carney 04/07/2012 10:59 AM

## 2012-04-07 NOTE — Progress Notes (Signed)
[facility] Group Notes:  (Counselor/Nursing/MHT/Case Management/Adjunct)  04/07/2012 4:13 PM  Type of Therapy:  Psychoeducational Skills  Participation Level:  Minimal  Participation Quality:  Inattentive  Affect:  Appropriate  Cognitive:  Appropriate  Insight:  Limited  Engagement in Group:  Limited  Engagement in Therapy:  Limited  Modes of Intervention:  Support  Summary of Progress/Problems: Pt was not engaged in the Group discussion and had to be re-directed by the Writer several times for holding side conversations while other Pt's were speaking.   Christ Kick 04/07/2012, 4:13 PM

## 2012-04-07 NOTE — Progress Notes (Signed)
Pt was discharged home today. She denied any S/I H/I or A/V hallucinations.  She was given f/u appointment, rx, sample medications, and hotline info booklet.  She voiced understanding to all instructions provided.  She declined the need for smoking cessation materials.  She removed her nicotine patch before she left. 

## 2012-04-08 NOTE — Progress Notes (Signed)
Patient Discharge Instructions:  After Visit Summary (AVS):   Faxed to:  04/08/2012 Psychiatric Admission Assessment Note:   Faxed to:  04/08/2012 Suicide Risk Assessment - Discharge Assessment:   Faxed to:  04/08/2012 Faxed/Sent to the Next Level Care provider:  04/08/2012  Faxed to [facility] @ [phone]  Wandra Scot, 04/08/2012, 6:07 PM

## 2012-04-11 ENCOUNTER — Emergency Department (HOSPITAL_COMMUNITY)
Admission: EM | Admit: 2012-04-11 | Discharge: 2012-04-11 | Disposition: A | Discharge status: Home / Self Care | Payer: Managed Care, Other (non HMO) | Attending: Emergency Medicine | Admitting: Emergency Medicine

## 2012-04-11 ENCOUNTER — Emergency Department (HOSPITAL_COMMUNITY): Payer: Managed Care, Other (non HMO)

## 2012-04-11 ENCOUNTER — Encounter (HOSPITAL_COMMUNITY): Payer: Self-pay

## 2012-04-11 DIAGNOSIS — F1994 Other psychoactive substance use, unspecified with psychoactive substance-induced mood disorder: Secondary | ICD-10-CM

## 2012-04-11 DIAGNOSIS — R209 Unspecified disturbances of skin sensation: Secondary | ICD-10-CM | POA: Insufficient documentation

## 2012-04-11 DIAGNOSIS — M25439 Effusion, unspecified wrist: Secondary | ICD-10-CM | POA: Insufficient documentation

## 2012-04-11 DIAGNOSIS — T50904A Poisoning by unspecified drugs, medicaments and biological substances, undetermined, initial encounter: Secondary | ICD-10-CM | POA: Insufficient documentation

## 2012-04-11 DIAGNOSIS — F1910 Other psychoactive substance abuse, uncomplicated: Secondary | ICD-10-CM

## 2012-04-11 DIAGNOSIS — R51 Headache: Secondary | ICD-10-CM

## 2012-04-11 DIAGNOSIS — E039 Hypothyroidism, unspecified: Secondary | ICD-10-CM

## 2012-04-11 DIAGNOSIS — R29898 Other symptoms and signs involving the musculoskeletal system: Secondary | ICD-10-CM | POA: Insufficient documentation

## 2012-04-11 DIAGNOSIS — R112 Nausea with vomiting, unspecified: Secondary | ICD-10-CM | POA: Insufficient documentation

## 2012-04-11 DIAGNOSIS — I891 Lymphangitis: Secondary | ICD-10-CM

## 2012-04-11 DIAGNOSIS — E079 Disorder of thyroid, unspecified: Secondary | ICD-10-CM | POA: Insufficient documentation

## 2012-04-11 DIAGNOSIS — Z79899 Other long term (current) drug therapy: Secondary | ICD-10-CM | POA: Insufficient documentation

## 2012-04-11 DIAGNOSIS — H53149 Visual discomfort, unspecified: Secondary | ICD-10-CM | POA: Insufficient documentation

## 2012-04-11 HISTORY — DX: Other psychoactive substance dependence, uncomplicated: F19.20

## 2012-04-11 LAB — BASIC METABOLIC PANEL
CO2: 27 mEq/L (ref 19–32)
Calcium: 9.4 mg/dL (ref 8.4–10.5)
Chloride: 96 mEq/L (ref 96–112)
Creatinine, Ser: 0.74 mg/dL (ref 0.50–1.10)
Glucose, Bld: 104 mg/dL — ABNORMAL HIGH (ref 70–99)

## 2012-04-11 LAB — URINALYSIS, ROUTINE W REFLEX MICROSCOPIC
Bilirubin Urine: NEGATIVE
Glucose, UA: NEGATIVE mg/dL
Nitrite: NEGATIVE
Specific Gravity, Urine: 1.025 (ref 1.005–1.030)
pH: 6.5 (ref 5.0–8.0)

## 2012-04-11 LAB — URINE MICROSCOPIC-ADD ON

## 2012-04-11 LAB — CBC
HCT: 40.5 % (ref 36.0–46.0)
Hemoglobin: 13.3 g/dL (ref 12.0–15.0)
MCH: 33 pg (ref 26.0–34.0)
MCV: 100.5 fL — ABNORMAL HIGH (ref 78.0–100.0)
Platelets: 236 10*3/uL (ref 150–400)
RBC: 4.03 MIL/uL (ref 3.87–5.11)
WBC: 6.2 10*3/uL (ref 4.0–10.5)

## 2012-04-11 LAB — RAPID URINE DRUG SCREEN, HOSP PERFORMED: Barbiturates: NOT DETECTED

## 2012-04-11 MED ORDER — METOCLOPRAMIDE HCL 5 MG/ML IJ SOLN
10.0000 mg | Freq: Once | INTRAMUSCULAR | Status: AC
  Administered 2012-04-11: 10 mg via INTRAVENOUS
  Filled 2012-04-11: qty 2

## 2012-04-11 MED ORDER — IBUPROFEN 800 MG PO TABS: 800.0000 mg | ORAL_TABLET | Freq: Three times a day (TID) | ORAL | Status: DC

## 2012-04-11 MED ORDER — SODIUM CHLORIDE 0.9 % IV BOLUS (SEPSIS)
1000.0000 mL | Freq: Once | INTRAVENOUS | Status: AC
  Administered 2012-04-11: 1000 mL via INTRAVENOUS

## 2012-04-11 MED ORDER — VANCOMYCIN HCL IN DEXTROSE 1-5 GM/200ML-% IV SOLN
1000.0000 mg | Freq: Once | INTRAVENOUS | Status: AC
  Administered 2012-04-11: 1000 mg via INTRAVENOUS
  Filled 2012-04-11: qty 200

## 2012-04-11 MED ORDER — SODIUM CHLORIDE 0.9 % IV SOLN
INTRAVENOUS | Status: DC
  Administered 2012-04-11: 14:00:00 via INTRAVENOUS

## 2012-04-11 MED ORDER — DOXYCYCLINE HYCLATE 100 MG PO CAPS: 100.0000 mg | ORAL_CAPSULE | Freq: Two times a day (BID) | ORAL | Status: DC

## 2012-04-11 MED ORDER — IBUPROFEN 800 MG PO TABS
800.0000 mg | ORAL_TABLET | Freq: Once | ORAL | Status: AC
  Administered 2012-04-11: 800 mg via ORAL
  Filled 2012-04-11: qty 1

## 2012-04-11 NOTE — ED Notes (Signed)
Here for migraine, seen at [place] and told had a seizure, today pt sts she keeps falling and head hurts. Heroin used Friday. Denies use today.

## 2012-04-11 NOTE — ED Notes (Signed)
Friend at bedside, Barbara Key,  has to leave can be reached at  [phone]

## 2012-04-11 NOTE — ED Provider Notes (Signed)
Medical screening examination/treatment/procedure(s) were conducted as a shared visit with non-physician practitioner(s) and myself.  I personally evaluated the patient during the encounter  Doug Sou, MD 04/11/12 1821

## 2012-04-11 NOTE — ED Provider Notes (Addendum)
Complains of headache and right arm pain and inability to extend right wrist since 04/09/2013. Last injected herself with heroin in her right arm 2 days ago. On exam she is alert awake Glasgow Coma Score 15 right upper extremity there is redness streak at dorsal hand extending two thirds of the way up her forearm, dorsal aspect radial pulse 2+, no axillary nodes. Forearm and dorsum of hand diffusely tender. There are pinpoint scabbedlesions at dorsum of hand and at webspace between middle and fourth fingers and at antecubital fossa. She is able to extend her wrist however it is painful Further history from patient she had an IV in her right hand at another hospital a few days ago Likely has superficial phlebitis  Doug Sou, MD 04/11/12 1520 Case discussed with Dr.Samtani, who is in turn spoken with infectious disease. Suggest prescription doxycycline and. We will write for ibuprofen for headaches. Dr. Mahala Menghini he will follow HIV and RPR results. Patient is to get her arm rechecked [facility] within 2 days. She states she is unable to see her primary care doctor due to lack of funds   Doug Sou, MD 04/11/12 2725  Doug Sou, MD 04/11/12 3664

## 2012-04-11 NOTE — ED Provider Notes (Signed)
Medical screening examination/treatment/procedure(s) were performed by non-physician practitioner and as supervising physician I was immediately available for consultation/collaboration.  Cheri Guppy, MD 04/11/12 [phone]

## 2012-04-11 NOTE — ED Provider Notes (Signed)
History     CSN: [id number]  Arrival date & time 04/11/12  1243   First MD Initiated Contact with Patient 04/11/12 1309      Chief Complaint  Patient presents with  . Migraine  . Seizures    (Consider location/radiation/quality/duration/timing/severity/associated sxs/prior treatment) The history is provided by the patient.   41 year old female with a history of alcohol/cocaine/marijuana abuse with recent discharge from [facility] after attempted detox presents the emergency room with a chief complaint of bilateral throbbing headache that was gradual in onset 2 days ago. She does have photophobia but denies any visual change. There is been associated nausea and vomiting with last emesis last night. She also has weakness and numbness to the right upper extremity distal to the elbow. There was no injury to this extremity. She denies any dizziness or lightheadedness, chest pain or shortness of breath, abdominal pain, difficulty ambulating or difficulty speaking. Of note, the headache began around the same time as a questionable seizure and fall to the ground. The patient was transported to [facility] after this episode and had a thorough evaluation including a CT scan of the head and an MRI of the brain that showed only sinus disease. Per prior records, EMS reported that patient was witnessed having a pseudoseizure immedially after which she was alert and oriented. An MR of the lumbar spine was also performed at that visit and showed no acute findings.  Past Medical History  Diagnosis Date  . Thyroid disease   . Cholelithiases     Past Surgical History  Procedure Date  . Foot surgery 3/13; 06/12    to repair a deformity: plate and screws to Rt then Lt foot    Family History  Problem Relation Age of Onset  . Cancer Mother   . Alcohol abuse Father     History  Substance Use Topics  . Smoking status: Current Everyday Smoker -- 1.0 packs/day for 26 years    Types:  Cigarettes  . Smokeless tobacco: Not on file  . Alcohol Use: Yes     one pint Vodka per day    Review of Systems 10 systems reviewed and are negative for acute change except as noted in the HPI.  Allergies  Review of patient's allergies indicates no known allergies.  Home Medications   Current Outpatient Rx  Name Route Sig Dispense Refill  . DULOXETINE HCL 20 MG PO CPEP Oral Take 1 capsule (20 mg total) by mouth 2 (two) times daily. For depression and anxiety. 60 capsule 1  . GABAPENTIN 100 MG PO CAPS Oral Take 2 capsules (200 mg total) by mouth 4 (four) times daily -  with meals and at bedtime. For anxiety and pain.240 240 capsule 1  . IBUPROFEN 200 MG PO TABS Oral Take 800 mg by mouth every 6 (six) hours as needed. pain    . LEVOTHYROXINE SODIUM 175 MCG PO TABS Oral Take 1 tablet (175 mcg total) by mouth daily. For thyroid disease. 30 tablet 0  . PANTOPRAZOLE SODIUM 20 MG PO TBEC Oral Take 1 tablet (20 mg total) by mouth 2 (two) times daily before a meal. For GERD. 60 tablet 0  . QUETIAPINE 12.5 MG HALF TABLET Oral Take 0.5 tablets (12.5 mg total) by mouth 2 (two) times daily. For anxiety. 30 tablet 1  . TRAZODONE HCL 100 MG PO TABS Oral Take 1 tablet (100 mg total) by mouth at bedtime. For insomnia. 30 tablet 0  . ALBUTEROL SULFATE HFA 108 (  90 BASE) MCG/ACT IN AERS Inhalation Inhale 1-2 puffs into the lungs every 6 (six) hours as needed for wheezing. 1 Inhaler 0    BP 122/94  Pulse 81  Temp 98.2 F (36.8 C) (Oral)  Resp 18  SpO2 98%  Physical Exam  Constitutional: She is oriented to person, place, and time. She appears well-developed and well-nourished.       Vital signs are reviewed and are normal. Mildly uncomfortable appearing, sitting on stretcher in dark room.  HENT:  Head: Normocephalic and atraumatic.  Right Ear: External ear normal.  Left Ear: External ear normal.       Oral mucosa dry  Eyes: Conjunctivae and EOM are normal. Pupils are equal, round, and reactive  to light.       No nystagmus. Visual fields full bilaterally.  Neck: Normal range of motion. Neck supple.  Cardiovascular: Normal rate, regular rhythm and normal heart sounds.        Bilateral radial and DP pulses are 2+   Pulmonary/Chest: Effort normal and breath sounds normal. No respiratory distress. She has no wheezes. She has no rales. She exhibits no tenderness.  Abdominal: Soft. Bowel sounds are normal. She exhibits no distension. There is no tenderness.  Musculoskeletal:       See skin exam.  Lymphadenopathy:    She has no cervical adenopathy.  Neurological: She is alert and oriented to person, place, and time. No cranial nerve deficit (3-12 intact) or sensory deficit (intact to light touch in all extremities.). She displays a negative Romberg sign. Gait normal. GCS eye subscore is 4. GCS verbal subscore is 5. GCS motor subscore is 6.       Grip strength on right 4/5. Wrist extension on right 3/5, observed movement when distracted but pt makes no effort against resistance. Wrist flexion preserved. Shoulder and elbow movements on right side with poor effort, 4+/5. Left side 5/5 throughout UE.   Skin: Skin is warm and dry. There is erythema.       Small wounds to bilateral AC fossa. Pinpoint wounds to dorsum of right hand. There is a 55mm-wide erythematous streak originating at one of these wounds at the proximal dorsal hand that extends approx 2/3 up the forearm. Slight wrist and hand swelling is seen and the area is tender to palpation. No induration or abscess seen.    ED Course  Procedures (including critical care time)  Labs Reviewed  CBC - Abnormal; Notable for the following:    MCV 100.5 (*)     All other components within normal limits  BASIC METABOLIC PANEL - Abnormal; Notable for the following:    Sodium 133 (*)     Glucose, Bld 104 (*)     All other components within normal limits  URINALYSIS, ROUTINE W REFLEX MICROSCOPIC - Abnormal; Notable for the following:     APPearance CLOUDY (*)     Hgb urine dipstick SMALL (*)     Leukocytes, UA MODERATE (*)     All other components within normal limits  URINE MICROSCOPIC-ADD ON - Abnormal; Notable for the following:    Squamous Epithelial / LPF FEW (*)     All other components within normal limits  PREGNANCY, URINE  CULTURE, BLOOD (ROUTINE X 2)  CULTURE, BLOOD (ROUTINE X 2)  URINE RAPID DRUG SCREEN (HOSP PERFORMED)   Dg Forearm Right  04/11/2012  *RADIOLOGY REPORT*  Clinical Data: Forearm pain and swelling.  Recent fall.  RIGHT FOREARM - 2 VIEW  Comparison:  None.  Findings: There is no evidence of fracture or other focal bone lesions.  Soft tissues are unremarkable. No evidence of subcutaneous emphysema or radiopaque foreign body.  IMPRESSION: Negative.  Original Report Authenticated By: Danae Orleans, M.D.   Dg Hand Complete Right  04/11/2012  *RADIOLOGY REPORT*  Clinical Data: Swelling of right forearm.  Evaluate soft tissues to rule out subcutaneous gas.  Fall yesterday.  RIGHT HAND - COMPLETE 3+ VIEW  Comparison: None  Findings: Suboptimal lateral view, secondary to overlap of digits. There may be mild dorsal soft tissue swelling.  No subcutaneous gas. No acute fracture or dislocation.  Subtle lucent focus within the proximal aspect proximal phalanx of fourth digit ulnarly.  No aggressive features.  IMPRESSION:  1.  No subcutaneous gas or other acute soft tissue abnormality identified. 2.  Nonaggressive appearing lesion within the proximal fourth phalanx.  Favor enchondroma or fibrous dysplasia.  Original Report Authenticated By: Consuello Bossier, M.D.     1. Ascending lymphangitis   2. Polysubstance abuse   3. Headache       MDM  Pt giving history with inaccurate timeframes. HA x 2 days, CT and MRI at [facility] demonstrate only sinus disease. Imaging studies combined with physical examination today make secondary causes of HA very unlikely. Reglan is ordered for nausea and in attempt to relieve HA. Fluids  are ordered for dehydration. RUE weakness/swelling/decreased sensation present prior to performance at MRI, per pt. Distribution not c/w central lesion. There appears to be streaking lymphangitis originating from a wound on the dorsal hand. Although pt denies heroin injection at this site, I am doubtful given her history. IV antibiotics are initiated (Vancomycin) and blood cultures are ordered. Imaging of the extremity is performed to r/o subcutaneous gas and is negative for such. Pt is afebrile with no leukocytosis- does not appear toxic at this time. We will plan for admission.  5:11 PM Spoke with Dr Mahala Menghini with [facility]- he will see in ED.         Shaaron Adler, [place] 04/11/12 1713

## 2012-04-11 NOTE — ED Notes (Signed)
Consulting MD at bedside

## 2012-04-11 NOTE — Consult Note (Signed)
[facility] History and Physical  Barbara Key JYN:[id number] DOB: 07-Mar-1971 DOA: 04/11/2012  Referring physician: Artis Flock PA PCP: Dorrene German, MD   Chief Complaint: Headache, hand-pain  HPI:  41 yr old female presented to ED here today because of Headaches and R hand pain.  Has been having HA for 3 days, usually takes Ibuprofen for this and it usually helps-has tried [facility] powders and nothing seemed to help.  Headache is in the behind her eyes, assosc with difficulty seeing with bright lights.  No aura, but does have some nausea-never been seen by a neurologist for headaches before.  Hand pain started Friday.  Seemd to come on after the seizure and since [place] the patient has been having some swelling and some redness.  It feels warm to touch.    SHe states her hand feels "weak" and will not work-she states she has numbness there and "doesnt have a hand"   Noted she just had a short stay at behavioral medicine for detox but then admits to using heroin and cocaine this past Friday 7/12. In the emergency room she was given imaging of the extremity was done blood cultures were ordered and one dose of IV vancomycin was given to the patient.  Her urine drug screen is positive for does not has been, opiates, cocaine and negative for alcohol Base metabolic panel shows some mild hyponatremia   patient's CBC is completely normal. Hand x-ray and right forearm x-ray showed no acute findings other than a nonaggressive-appearing lesion  Review of Systems: No chest pain no shortness of breath no blurred or double vision no fevers no chills no cough no abdominal pain no dysuria no vomiting no nausea   Past Medical History  Diagnosis Date  . Thyroid disease   . Cholelithiases    Past Surgical History  Procedure Date  . Foot surgery 3/13; 06/12    to repair a deformity: plate and screws to Rt then Lt foot   Social History:  reports that she has been smoking Cigarettes.  She has a 26  pack-year smoking history. She does not have any smokeless tobacco history on file. She reports that she drinks alcohol. She reports that she uses illicit drugs ("Crack" cocaine and Heroin).  No Known Allergies  Family History  Problem Relation Age of Onset  . Cancer Mother   . Alcohol abuse Father     Prior to Admission medications   Medication Sig Start Date End Date Taking? Authorizing Provider  DULoxetine (CYMBALTA) 20 MG capsule Take 1 capsule (20 mg total) by mouth 2 (two) times daily. For depression and anxiety. 04/07/12 04/07/13 Yes Neil Mashburn, PA-C  gabapentin (NEURONTIN) 100 MG capsule Take 2 capsules (200 mg total) by mouth 4 (four) times daily -  with meals and at bedtime. For anxiety and pain.240 04/07/12 04/07/13 Yes Neil Mashburn, PA-C  ibuprofen (ADVIL,MOTRIN) 200 MG tablet Take 800 mg by mouth every 6 (six) hours as needed. pain   Yes Historical Provider, MD  levothyroxine (SYNTHROID, LEVOTHROID) 175 MCG tablet Take 1 tablet (175 mcg total) by mouth daily. For thyroid disease. 04/07/12  Yes Verne Spurr, PA-C  pantoprazole (PROTONIX) 20 MG tablet Take 1 tablet (20 mg total) by mouth 2 (two) times daily before a meal. For GERD. 04/07/12 04/07/13 Yes Lloyd Huger Mashburn, PA-C  QUEtiapine (SEROQUEL) 12.5 mg TABS Take 0.5 tablets (12.5 mg total) by mouth 2 (two) times daily. For anxiety. 04/07/12  Yes Verne Spurr, PA-C  traZODone (DESYREL) 100 MG  tablet Take 1 tablet (100 mg total) by mouth at bedtime. For insomnia. 04/07/12  Yes Verne Spurr, PA-C  albuterol (PROVENTIL HFA;VENTOLIN HFA) 108 (90 BASE) MCG/ACT inhaler Inhale 1-2 puffs into the lungs every 6 (six) hours as needed for wheezing. 04/07/12 04/07/13  Verne Spurr, PA-C   Physical Exam: Filed Vitals:   04/11/12 1247 04/11/12 1503 04/11/12 1659  BP: 131/84 122/94 124/93  Pulse: 16 81 77  Temp: 98.3 F (36.8 C) 98.2 F (36.8 C) 98.6 F (37 C)  TempSrc: Oral Oral Oral  Resp: 18 18 18   SpO2: 96% 98% 97%     General:    pleasant tired-appearing Caucasian female looking about stated age   Eyes:  no pallor or icterus  ENT:  clinically clear no added sound   Neck: soft supple, NT, Midline thyroid  Cardiovascular: s1 s2 no m/r/g  Respiratory: clinically clear  Abdomen: soft, NT/ND  Skin: redness over the R forearm.  One small streak.  Able to open and close hand, able to dorsi and volar flex although painful  Musculoskeletal: Mod ROM to RUE  Psychiatric: Flat affect, depressed  Neurologic: Cn 2-12, NO focal deficits  Labs on Admission:  Basic Metabolic Panel:  Lab 04/11/12 1610  NA 133*  K 4.1  CL 96  CO2 27  GLUCOSE 104*  BUN 12  CREATININE 0.74  CALCIUM 9.4  MG --  PHOS --   Liver Function Tests: No results found for this basename: AST:5,ALT:5,ALKPHOS:5,BILITOT:5,PROT:5,ALBUMIN:5 in the last 168 hours No results found for this basename: LIPASE:5,AMYLASE:5 in the last 168 hours No results found for this basename: AMMONIA:5 in the last 168 hours CBC:  Lab 04/11/12 1351  WBC 6.2  NEUTROABS --  HGB 13.3  HCT 40.5  MCV 100.5*  PLT 236   Cardiac Enzymes: No results found for this basename: CKTOTAL:5,CKMB:5,CKMBINDEX:5,TROPONINI:5 in the last 168 hours BNP: No components found with this basename: POCBNP:5 CBG: No results found for this basename: GLUCAP:5 in the last 168 hours  Radiological Exams on Admission: Dg Forearm Right  04/11/2012  *RADIOLOGY REPORT*  Clinical Data: Forearm pain and swelling.  Recent fall.  RIGHT FOREARM - 2 VIEW  Comparison:  None.  Findings: There is no evidence of fracture or other focal bone lesions.  Soft tissues are unremarkable. No evidence of subcutaneous emphysema or radiopaque foreign body.  IMPRESSION: Negative.  Original Report Authenticated By: Danae Orleans, M.D.   Dg Hand Complete Right  04/11/2012  *RADIOLOGY REPORT*  Clinical Data: Swelling of right forearm.  Evaluate soft tissues to rule out subcutaneous gas.  Fall yesterday.  RIGHT HAND  - COMPLETE 3+ VIEW  Comparison: None  Findings: Suboptimal lateral view, secondary to overlap of digits. There may be mild dorsal soft tissue swelling.  No subcutaneous gas. No acute fracture or dislocation.  Subtle lucent focus within the proximal aspect proximal phalanx of fourth digit ulnarly.  No aggressive features.  IMPRESSION:  1.  No subcutaneous gas or other acute soft tissue abnormality identified. 2.  Nonaggressive appearing lesion within the proximal fourth phalanx.  Favor enchondroma or fibrous dysplasia.  Original Report Authenticated By: Consuello Bossier, M.D.    EKG: Independently reviewed.  none performed  Assessment/Plan Active Problems:  * No active hospital problems. *    41 year old female with likely mild cellulitis of the right forearm.  Patient has no systemic indices of infection.  Patient does have a primary care physician Dr. Concepcion Elk who she stop 2 months ago.  Patient  just completed a course of detox.  I have recommended to her that she needs to complete a 10-14 day course of doxycycline 100 mg twice a day.  This is a very cheap medication and if she is able to afford her polysubstance abuse habit, certainly she should be able to procure this as well.  Patient does not have any neurological abnormalities but certainly does have pain on extension and flexion of her hand is as expected.  I did discuss this patient's case briefly with ID physician Dr. Ninetta Lights given her relative risk-he recommended an HIv and RPR and Doxicycline 100 mg 10-14 days   Although she doesn't meet inpatient critetrion, she should have the area looked at again-she mentioned to Dr. Ethelda Chick that she doesn't have $ to get in to see him. Patient will f/u at Urgent care center for out-patient re-eval of the area and I will follow her HIv/RPR titers.  Discussed in detail with EDP and Patient  Thanks for the consult  Disposition Plan: Home with Antibiotics  Rhetta Mura [facility] Pager  [phone]  If 7PM-7AM, please contact night-coverage [email or web address] Password [facility] 04/11/2012, 5:17 PM

## 2012-04-12 LAB — HIV ANTIBODY (ROUTINE TESTING W REFLEX): HIV: NONREACTIVE

## 2012-04-12 LAB — RPR: RPR Ser Ql: NONREACTIVE

## 2012-04-14 ENCOUNTER — Emergency Department (HOSPITAL_COMMUNITY)
Admission: EM | Admit: 2012-04-14 | Discharge: 2012-04-14 | Disposition: A | Discharge status: Other Acute Inpatient Hospital | Payer: Managed Care, Other (non HMO) | Attending: Emergency Medicine | Admitting: Emergency Medicine

## 2012-04-14 ENCOUNTER — Encounter (HOSPITAL_COMMUNITY): Payer: Self-pay

## 2012-04-14 DIAGNOSIS — R45851 Suicidal ideations: Secondary | ICD-10-CM | POA: Insufficient documentation

## 2012-04-14 DIAGNOSIS — E079 Disorder of thyroid, unspecified: Secondary | ICD-10-CM | POA: Insufficient documentation

## 2012-04-14 DIAGNOSIS — F17200 Nicotine dependence, unspecified, uncomplicated: Secondary | ICD-10-CM | POA: Insufficient documentation

## 2012-04-14 DIAGNOSIS — F1910 Other psychoactive substance abuse, uncomplicated: Secondary | ICD-10-CM | POA: Insufficient documentation

## 2012-04-14 LAB — RAPID URINE DRUG SCREEN, HOSP PERFORMED
Barbiturates: NOT DETECTED
Cocaine: POSITIVE — AB
Tetrahydrocannabinol: NOT DETECTED

## 2012-04-14 LAB — COMPREHENSIVE METABOLIC PANEL
ALT: 23 U/L (ref 0–35)
AST: 16 U/L (ref 0–37)
CO2: 22 mEq/L (ref 19–32)
Calcium: 9.9 mg/dL (ref 8.4–10.5)
Chloride: 101 mEq/L (ref 96–112)
GFR calc non Af Amer: >90 mL/min (ref >90)
Potassium: 3.6 mEq/L (ref 3.5–5.1)
Sodium: 136 mEq/L (ref 135–145)

## 2012-04-14 LAB — CBC
MCH: 32.8 pg (ref 26.0–34.0)
Platelets: 306 10*3/uL (ref 150–400)
RBC: 4.33 MIL/uL (ref 3.87–5.11)
WBC: 7.9 10*3/uL (ref 4.0–10.5)

## 2012-04-14 MED ORDER — DIPHENHYDRAMINE HCL 50 MG/ML IJ SOLN
50.0000 mg | Freq: Once | INTRAMUSCULAR | Status: AC
  Administered 2012-04-14: 50 mg via INTRAMUSCULAR
  Filled 2012-04-14: qty 1

## 2012-04-14 MED ORDER — ACETAMINOPHEN 325 MG PO TABS: 650.0000 mg | ORAL_TABLET | ORAL | Status: DC | PRN

## 2012-04-14 MED ORDER — IBUPROFEN 600 MG PO TABS
600.0000 mg | ORAL_TABLET | Freq: Three times a day (TID) | ORAL | Status: DC | PRN
  Administered 2012-04-14 (×2): 600 mg via ORAL
  Filled 2012-04-14 (×2): qty 1

## 2012-04-14 MED ORDER — NICOTINE 21 MG/24HR TD PT24
21.0000 mg | MEDICATED_PATCH | Freq: Every day | TRANSDERMAL | Status: DC
  Administered 2012-04-14: 21 mg via TRANSDERMAL
  Filled 2012-04-14: qty 1

## 2012-04-14 MED ORDER — METOCLOPRAMIDE HCL 5 MG/ML IJ SOLN
10.0000 mg | Freq: Once | INTRAMUSCULAR | Status: AC
  Administered 2012-04-14: 10 mg via INTRAMUSCULAR
  Filled 2012-04-14: qty 2

## 2012-04-14 MED ORDER — ALUM & MAG HYDROXIDE-SIMETH 200-200-20 MG/5ML PO SUSP: 30.0000 mL | ORAL | Status: DC | PRN

## 2012-04-14 MED ORDER — ZOLPIDEM TARTRATE 5 MG PO TABS: 5.0000 mg | ORAL_TABLET | Freq: Every evening | ORAL | Status: DC | PRN

## 2012-04-14 MED ORDER — LORAZEPAM 1 MG PO TABS
1.0000 mg | ORAL_TABLET | Freq: Three times a day (TID) | ORAL | Status: DC | PRN
  Administered 2012-04-14: 1 mg via ORAL
  Filled 2012-04-14: qty 1

## 2012-04-14 MED ORDER — ONDANSETRON HCL 4 MG PO TABS: 4.0000 mg | ORAL_TABLET | Freq: Three times a day (TID) | ORAL | Status: DC | PRN

## 2012-04-14 NOTE — ED Provider Notes (Signed)
Patient here in the ED for polysubstance abuse requesting detox also history of suicide attempt. Patient has several medical complaints at the time I saw her. She complains of a headache behind her eyes throbbing since she had a seizure on Friday which was 5 days ago she also complains of a wrist drop in her right hand since she had an IV inserted several days ago also she is currently finishing antibiotics for that. Patient has had MRI of her brain and head CT because of her complaints of headache in her right arm weakness.  Patient is alert and cooperative she has good flexion-extension of her right elbow however she does appear to have a wrist drop on the right. She is weak to testing of the radial nerve. She's also complaining of severe headache. She is art he had a CT done in another facility.  Patient given Reglan 10 mg IM with Benadryl 50 mg IM.  Recommend hand orthopedist evaluation for her wrist drop.  Patient has been accepted to behavioral health by Dr.Puthevel.  Medical screening examination/treatment/procedure(s) were conducted as a shared visit with non-physician practitioner(s) and myself.  I personally evaluated the patient during the encounter  Devoria Albe, MD, Franz Dell, MD 04/14/12 [phone]

## 2012-04-14 NOTE — ED Notes (Signed)
Patient reports that she has been feeling suicidal x 4 days. Patient reports that she tried to overdose on heroin 4 days ago as well. Patient states she last used heroin 4 days ago and last used crack this morning at 0700. Patient reports that she had a pint of wine last night as well. Patient states that she is wanting help with addictions and suicidal thoughts.
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
## 2012-04-14 NOTE — ED Provider Notes (Signed)
History     CSN: [id number]  Arrival date & time 04/14/12  1158   First MD Initiated Contact with Patient 04/14/12 1201      Chief Complaint  Patient presents with  . Medical Clearance  . Suicidal    (Consider location/radiation/quality/duration/timing/severity/associated sxs/prior treatment) The history is provided by the patient.   41 year old female with a history of polysubstance abuse presents to emergency department with chief complaint of suicidal ideation with suicide attempt 4 days ago. She attempted to overdose on heroin Friday evening and was found in a parking lot and transported to [facility] where she underwent a thorough medical and psychiatric evaluation with discharge on Saturday. The patient states that they would not admit her for intensive detox as she left behavioral health hospital prior to treatment completion only days earlier. She presented to [facility] emergency department the next day and was evaluated for non-psychiatric complaints with subsequent discharge home. Today, she reports continued SI without additional attempt to overdose, last heroin use was on Friday. Has been consuming alcohol and other drugs in the meantime. Denies HI or hallucinations. Her physical complaints include a continued HA and right hand pain that are persistent and unchanged through both prior ED visits. Right hand pain is assoc with weakness in the wrist and numbness in the arm. No new injury. No fever, color change. No visual change, trouble speaking, trouble ambulating. Sx worse with movement, no alleviating factors.   Past Medical History  Diagnosis Date  . Thyroid disease   . Polysubstance (excluding opioids) dependence     Past Surgical History  Procedure Date  . Foot surgery 3/13; 06/12    to repair a deformity: plate and screws to Rt then Lt foot  . Tubal ligation   . Endometrial ablation     Family History  Problem Relation Age of Onset  . Cancer  Mother   . Alcohol abuse Father     History  Substance Use Topics  . Smoking status: Current Everyday Smoker -- 1.0 packs/day for 26 years    Types: Cigarettes  . Smokeless tobacco: Never Used  . Alcohol Use: Yes     one pint Vodka per day    Review of Systems 10 systems reviewed and are negative for acute change except as noted in the HPI.  Allergies  Review of patient's allergies indicates no known allergies.  Home Medications   Current Outpatient Rx  Name Route Sig Dispense Refill  . DOXYCYCLINE HYCLATE 100 MG PO CAPS Oral Take 100 mg by mouth 2 (two) times daily.    . DULOXETINE HCL 20 MG PO CPEP Oral Take 1 capsule (20 mg total) by mouth 2 (two) times daily. For depression and anxiety. 60 capsule 1  . GABAPENTIN 100 MG PO CAPS Oral Take 200 mg by mouth 2 (two) times daily. For anxiety and pain.240     . LEVOTHYROXINE SODIUM 175 MCG PO TABS Oral Take 1 tablet (175 mcg total) by mouth daily. For thyroid disease. 30 tablet 0  . NICOTINE 21 MG/24HR TD PT24 Transdermal Place 1 patch onto the skin daily.    . QUETIAPINE 12.5 MG HALF TABLET Oral Take 12.5 mg by mouth at bedtime. For anxiety.    . TRAZODONE HCL 100 MG PO TABS Oral Take 1 tablet (100 mg total) by mouth at bedtime. For insomnia. 30 tablet 0    BP 123/84  Pulse 82  Temp 97.5 F (36.4 C) (Oral)  Resp 16  SpO2 99%  Physical Exam  Nursing note and vitals reviewed. Constitutional: She is oriented to person, place, and time. She appears well-developed and well-nourished.       tearful  HENT:  Head: Normocephalic and atraumatic.  Right Ear: External ear normal.  Left Ear: External ear normal.       MMM  Eyes: Conjunctivae and EOM are normal. Pupils are equal, round, and reactive to light.  Neck: Neck supple.  Cardiovascular: Normal rate, regular rhythm and normal heart sounds.        Bilateral radial and DP pulses are 2+   Pulmonary/Chest: Effort normal and breath sounds normal. No respiratory distress. She  has no wheezes.  Abdominal: Soft. Bowel sounds are normal. She exhibits no distension. There is no tenderness.  Musculoskeletal: She exhibits edema (slight, to dosrum of right hand.) and tenderness (dorsum of right hand, dorsum of distal right forearm.).  Neurological: She is alert and oriented to person, place, and time. No cranial nerve deficit (3-12 intact).       Speech clear. MS appears baseline for pt and situation. Sensation intact to light touch in all extremities. Grip strength to right 4/5, left 5/5. Right wrist flexion 4/5, extension 3/5. Elbow flex/ext 5/5 on right. 5/5 strength to entire LUE.   Skin: Skin is warm and dry. No erythema.  Psychiatric: Her speech is normal. Her mood appears anxious. She is not actively hallucinating. She exhibits a depressed mood. She expresses suicidal ideation. She expresses no homicidal ideation.    ED Course  Procedures (including critical care time)  Labs Reviewed  COMPREHENSIVE METABOLIC PANEL - Abnormal; Notable for the following:    Glucose, Bld 122 (*)     All other components within normal limits  URINE RAPID DRUG SCREEN (HOSP PERFORMED) - Abnormal; Notable for the following:    Opiates POSITIVE (*)     Cocaine POSITIVE (*)     Benzodiazepines POSITIVE (*)     Amphetamines POSITIVE (*)     All other components within normal limits  CBC  ETHANOL  POCT PREGNANCY, URINE   No results found.   1. Suicidal ideation   2. Polysubstance abuse       MDM  Suicidal ideation with suicide attempt 5 days ago, multiple evals since that time. I evaluated this patient medically 3 days ago and reviewed records from [facility]. CT head and MRI brain were performed at that time and showed only sinus disease (which is suspect is the source of her continued HA). As there are no changes in her symptoms, no repeat imaging is warranted. She did have erythematous streaking from a now-healed wound seen at prior visit- this is improved  today and she has been taking doxycycline for infection. Plain films of the affected extremity were obtained at prior visit and showed no acute findings- no additional imaging warranted at this time.    Labs are reviewed and are unremarkable, multiple drugs identified in system. She is medically cleared and the ACT counselor has been notified of her need for assessment and placement.    8:18 PM EDP Lynelle Doctor has been given report on pt.  Shaaron Adler, PA-C 04/14/12 2018

## 2012-04-14 NOTE — ED Notes (Signed)
Christina Nt called dietary for lunch tray for pt, reports that they would deliver tray

## 2012-04-15 ENCOUNTER — Encounter (HOSPITAL_COMMUNITY): Payer: Self-pay

## 2012-04-15 ENCOUNTER — Inpatient Hospital Stay (HOSPITAL_COMMUNITY)
Admission: AD | Admit: 2012-04-15 | Discharge: 2012-04-22 | DRG: 897 | Disposition: A | Discharge status: Home / Self Care | Payer: Managed Care, Other (non HMO) | Source: Ambulatory Visit | Attending: Psychiatry | Admitting: Psychiatry

## 2012-04-15 DIAGNOSIS — K219 Gastro-esophageal reflux disease without esophagitis: Secondary | ICD-10-CM | POA: Diagnosis present

## 2012-04-15 DIAGNOSIS — F19939 Other psychoactive substance use, unspecified with withdrawal, unspecified: Principal | ICD-10-CM | POA: Diagnosis not present

## 2012-04-15 DIAGNOSIS — F1994 Other psychoactive substance use, unspecified with psychoactive substance-induced mood disorder: Secondary | ICD-10-CM | POA: Diagnosis present

## 2012-04-15 DIAGNOSIS — R29898 Other symptoms and signs involving the musculoskeletal system: Secondary | ICD-10-CM

## 2012-04-15 DIAGNOSIS — G5630 Lesion of radial nerve, unspecified upper limb: Secondary | ICD-10-CM | POA: Diagnosis present

## 2012-04-15 DIAGNOSIS — J45909 Unspecified asthma, uncomplicated: Secondary | ICD-10-CM | POA: Diagnosis present

## 2012-04-15 DIAGNOSIS — E039 Hypothyroidism, unspecified: Secondary | ICD-10-CM | POA: Diagnosis present

## 2012-04-15 DIAGNOSIS — F1120 Opioid dependence, uncomplicated: Secondary | ICD-10-CM | POA: Diagnosis present

## 2012-04-15 DIAGNOSIS — F1020 Alcohol dependence, uncomplicated: Secondary | ICD-10-CM | POA: Diagnosis present

## 2012-04-15 DIAGNOSIS — G8929 Other chronic pain: Secondary | ICD-10-CM | POA: Diagnosis present

## 2012-04-15 DIAGNOSIS — Z79899 Other long term (current) drug therapy: Secondary | ICD-10-CM

## 2012-04-15 DIAGNOSIS — R209 Unspecified disturbances of skin sensation: Secondary | ICD-10-CM | POA: Diagnosis present

## 2012-04-15 DIAGNOSIS — F17200 Nicotine dependence, unspecified, uncomplicated: Secondary | ICD-10-CM | POA: Diagnosis present

## 2012-04-15 MED ORDER — TRAZODONE HCL 100 MG PO TABS
100.0000 mg | ORAL_TABLET | Freq: Every day | ORAL | Status: DC
  Administered 2012-04-15 – 2012-04-17 (×3): 100 mg via ORAL
  Filled 2012-04-15 (×6): qty 1

## 2012-04-15 MED ORDER — NICOTINE 21 MG/24HR TD PT24
21.0000 mg | MEDICATED_PATCH | Freq: Every day | TRANSDERMAL | Status: DC
  Administered 2012-04-15 – 2012-04-22 (×8): 21 mg via TRANSDERMAL
  Filled 2012-04-15 (×13): qty 1

## 2012-04-15 MED ORDER — LOPERAMIDE HCL 2 MG PO CAPS: 2.0000 mg | ORAL_CAPSULE | ORAL | Status: DC | PRN

## 2012-04-15 MED ORDER — CLONIDINE HCL 0.1 MG PO TABS
0.1000 mg | ORAL_TABLET | Freq: Four times a day (QID) | ORAL | Status: AC
  Administered 2012-04-15 – 2012-04-17 (×7): 0.1 mg via ORAL
  Filled 2012-04-15 (×10): qty 1

## 2012-04-15 MED ORDER — CHLORDIAZEPOXIDE HCL 25 MG PO CAPS
25.0000 mg | ORAL_CAPSULE | ORAL | Status: AC
  Administered 2012-04-17 – 2012-04-18 (×2): 25 mg via ORAL
  Filled 2012-04-15 (×3): qty 1

## 2012-04-15 MED ORDER — ACETAMINOPHEN 325 MG PO TABS: 650.0000 mg | ORAL_TABLET | Freq: Four times a day (QID) | ORAL | Status: DC | PRN

## 2012-04-15 MED ORDER — HYDROXYZINE HCL 25 MG PO TABS
25.0000 mg | ORAL_TABLET | Freq: Four times a day (QID) | ORAL | Status: DC | PRN
  Administered 2012-04-15 – 2012-04-18 (×5): 25 mg via ORAL
  Filled 2012-04-15 (×2): qty 1

## 2012-04-15 MED ORDER — DULOXETINE HCL 20 MG PO CPEP
20.0000 mg | ORAL_CAPSULE | Freq: Two times a day (BID) | ORAL | Status: DC
  Administered 2012-04-15 – 2012-04-22 (×14): 20 mg via ORAL
  Filled 2012-04-15 (×19): qty 1

## 2012-04-15 MED ORDER — GABAPENTIN 100 MG PO CAPS
200.0000 mg | ORAL_CAPSULE | Freq: Two times a day (BID) | ORAL | Status: DC
  Administered 2012-04-15 – 2012-04-16 (×3): 200 mg via ORAL
  Filled 2012-04-15 (×6): qty 2

## 2012-04-15 MED ORDER — HYDROXYZINE HCL 25 MG PO TABS: 25.0000 mg | ORAL_TABLET | Freq: Four times a day (QID) | ORAL | Status: DC | PRN

## 2012-04-15 MED ORDER — NAPROXEN 500 MG PO TABS
500.0000 mg | ORAL_TABLET | Freq: Two times a day (BID) | ORAL | Status: AC | PRN
  Administered 2012-04-15 – 2012-04-18 (×5): 500 mg via ORAL
  Filled 2012-04-15 (×6): qty 1

## 2012-04-15 MED ORDER — VITAMIN B-1 100 MG PO TABS
100.0000 mg | ORAL_TABLET | Freq: Every day | ORAL | Status: DC
  Administered 2012-04-16 – 2012-04-22 (×7): 100 mg via ORAL
  Filled 2012-04-15 (×9): qty 1

## 2012-04-15 MED ORDER — DICYCLOMINE HCL 20 MG PO TABS: 20.0000 mg | ORAL_TABLET | Freq: Four times a day (QID) | ORAL | Status: AC | PRN

## 2012-04-15 MED ORDER — CLONIDINE HCL 0.1 MG PO TABS
0.1000 mg | ORAL_TABLET | Freq: Every day | ORAL | Status: AC
  Administered 2012-04-20 – 2012-04-21 (×2): 0.1 mg via ORAL
  Filled 2012-04-15 (×2): qty 1

## 2012-04-15 MED ORDER — MAGNESIUM HYDROXIDE 400 MG/5ML PO SUSP
30.0000 mL | Freq: Every day | ORAL | Status: DC | PRN
  Administered 2012-04-18 – 2012-04-20 (×2): 30 mL via ORAL
  Filled 2012-04-15: qty 30

## 2012-04-15 MED ORDER — ONDANSETRON 4 MG PO TBDP: 4.0000 mg | ORAL_TABLET | Freq: Four times a day (QID) | ORAL | Status: DC | PRN

## 2012-04-15 MED ORDER — ONDANSETRON 4 MG PO TBDP
4.0000 mg | ORAL_TABLET | Freq: Four times a day (QID) | ORAL | Status: AC | PRN
  Filled 2012-04-15: qty 1

## 2012-04-15 MED ORDER — CHLORDIAZEPOXIDE HCL 25 MG PO CAPS
25.0000 mg | ORAL_CAPSULE | Freq: Three times a day (TID) | ORAL | Status: AC
  Administered 2012-04-16 – 2012-04-17 (×3): 25 mg via ORAL
  Filled 2012-04-15 (×2): qty 1

## 2012-04-15 MED ORDER — CHLORDIAZEPOXIDE HCL 25 MG PO CAPS
25.0000 mg | ORAL_CAPSULE | Freq: Four times a day (QID) | ORAL | Status: AC
  Administered 2012-04-15 – 2012-04-16 (×6): 25 mg via ORAL
  Filled 2012-04-15 (×6): qty 1

## 2012-04-15 MED ORDER — CHLORDIAZEPOXIDE HCL 25 MG PO CAPS
25.0000 mg | ORAL_CAPSULE | Freq: Four times a day (QID) | ORAL | Status: AC | PRN
  Administered 2012-04-17: 25 mg via ORAL
  Filled 2012-04-15: qty 1

## 2012-04-15 MED ORDER — ALUM & MAG HYDROXIDE-SIMETH 200-200-20 MG/5ML PO SUSP
30.0000 mL | ORAL | Status: DC | PRN
  Administered 2012-04-18 – 2012-04-21 (×2): 30 mL via ORAL
  Filled 2012-04-15: qty 30

## 2012-04-15 MED ORDER — ADULT MULTIVITAMIN W/MINERALS CH
1.0000 | ORAL_TABLET | Freq: Every day | ORAL | Status: DC
  Administered 2012-04-15 – 2012-04-16 (×2): 1 via ORAL
  Administered 2012-04-17: 10:00:00 via ORAL
  Administered 2012-04-18 – 2012-04-22 (×5): 1 via ORAL
  Filled 2012-04-15 (×10): qty 1

## 2012-04-15 MED ORDER — LOPERAMIDE HCL 2 MG PO CAPS: 2.0000 mg | ORAL_CAPSULE | ORAL | Status: AC | PRN

## 2012-04-15 MED ORDER — THIAMINE HCL 100 MG/ML IJ SOLN: 100.0000 mg | Freq: Once | INTRAMUSCULAR | Status: DC

## 2012-04-15 MED ORDER — LEVOTHYROXINE SODIUM 175 MCG PO TABS
175.0000 ug | ORAL_TABLET | Freq: Every day | ORAL | Status: DC
  Administered 2012-04-16 – 2012-04-22 (×7): 175 ug via ORAL
  Filled 2012-04-15 (×10): qty 1

## 2012-04-15 MED ORDER — CHLORDIAZEPOXIDE HCL 25 MG PO CAPS
25.0000 mg | ORAL_CAPSULE | Freq: Every day | ORAL | Status: AC
  Administered 2012-04-19: 25 mg via ORAL
  Filled 2012-04-15: qty 1

## 2012-04-15 MED ORDER — METHOCARBAMOL 500 MG PO TABS
500.0000 mg | ORAL_TABLET | Freq: Three times a day (TID) | ORAL | Status: AC | PRN
  Administered 2012-04-15 – 2012-04-19 (×4): 500 mg via ORAL
  Filled 2012-04-15 (×5): qty 1

## 2012-04-15 MED ORDER — DOXYCYCLINE HYCLATE 100 MG PO TABS
100.0000 mg | ORAL_TABLET | Freq: Two times a day (BID) | ORAL | Status: DC
  Administered 2012-04-15 – 2012-04-22 (×15): 100 mg via ORAL
  Filled 2012-04-15 (×19): qty 1

## 2012-04-15 MED ORDER — CLONIDINE HCL 0.1 MG PO TABS
0.1000 mg | ORAL_TABLET | ORAL | Status: AC
  Administered 2012-04-17 – 2012-04-19 (×3): 0.1 mg via ORAL
  Filled 2012-04-15 (×6): qty 1

## 2012-04-15 NOTE — Tx Team (Signed)
Initial Interdisciplinary Treatment Plan  PATIENT STRENGTHS: (choose at least two) Average or above average intelligence Capable of independent living General fund of knowledge  PATIENT STRESSORS: Financial difficulties Marital or family conflict Substance abuse   PROBLEM LIST: Problem List/Patient Goals Date to be addressed Date deferred Reason deferred Estimated date of resolution  Poly substance abuse      Depression                                                 DISCHARGE CRITERIA:  Ability to meet basic life and health needs Adequate post-discharge living arrangements Improved stabilization in mood, thinking, and/or behavior Verbal commitment to aftercare and medication compliance Withdrawal symptoms are absent or subacute and managed without 24-hour nursing intervention  PRELIMINARY DISCHARGE PLAN: Attend 12-step recovery group Placement in alternative living arrangements  PATIENT/FAMIILY INVOLVEMENT: This treatment plan has been presented to and reviewed with the patient, SAMMI STOLARZ, and/or family member, .  The patient and family have been given the opportunity to ask questions and make suggestions.  Andrena Mews 04/15/2012, 1:05 AM

## 2012-04-15 NOTE — H&P (Signed)
Pt seen and evaluated upon admission.  Completed Admission Suicide Risk Assessment.  See orders.  Pt agreeable with plan.  Discussed with team.   

## 2012-04-15 NOTE — Progress Notes (Signed)
D: Pt having passive SI but contracts for safety; pt has a depressed mood and affect; pt rates her depression and hopelessness a 10 out of 10 (1 low/10 high); pt reports having a low energy level, a poor appetite and having a poor night of sleep; pt is also experiencing pain related to a previous foot surgery; pt also has slight swelling in her right hand the she thinks is caused by an IV placed by EMS prior to her hospitalization A: Pt given emotional support from RN; pt encouraged to come to staff with concerns and/or questions; pt's medication routine continued; pt's orders and plan of care reviewed; MD notified of pt's complaints R: Pt remains appropriate and cooperative; no new orders given from MD; will continue to monitor pt

## 2012-04-15 NOTE — H&P (Signed)
Psychiatric Admission Assessment Adult  Patient Identification:  Barbara Key  Date of Evaluation:  04/15/2012  Chief Complaint:  Depressive Disorder NOS; Polysubstance Dependence  History of Present Illness: This is a 41 year old Caucasian female, admitted to [facility] from the [facility] ED with complaints of suicidal ideations. Patient was recently discharged from the [facility] at about a week a ago on 04/07/12 after detoxification treatment. Patient was scheduled for continuation of substance abuse treatment at the [facility]. This time, patient reports, "I felt suicidal last Friday, and I overdosed on heroin. A tax driver found me lying on the side of the road on the streets and took me to [facility]. I spent one night at the hospital and was discharged. Yesterday, the suicidal thoughts came back, that was why I decided to go the [facility] ED. I have been using drugs since the age of 11. The longest that I have been sober was 8 months in 2012, and that was after my drug rehabilitation treatment at the [facility] towards the end of 2011. I use drugs to help my pain go away. I lost my whole family because of my drug use. My father disowned me, he threw me out of his house. I am practically living in my car right now. I recently lost my job as well while I was in this hospital 2 weeks ago. My drug use caused me my marriage. I was separated from my husband 2 years ago. He has taken my 2 children from me, a son and daughter. He accused me of not taking care of them because my son had a seizure activities last Friday while I was high on drugs. The last time I saw my son was last Friday. I have a court date at the end end of this month for crashing my car on a tree under the influence of drugs".  ROS: Negative for fever.  HENT: Negative for congestion and rhinorrhea.  Respiratory: Negative for cough, chest tightness and shortness of breath.  Cardiovascular: Negative  for chest pain.  Gastrointestinal: Negative for nausea, vomiting and abdominal pain.  Skin: Negative for rash, however, patient reports pain to Rt armand back of Rt wrist. Neurological: Negative for weakness and headaches   Mood Symptoms:  Helplessness, Hopelessness, Sadness, SI, Worthlessness,  Depression Symptoms:  depressed mood, psychomotor agitation, feelings of worthlessness/guilt, suicidal thoughts with specific plan, suicidal attempt, anxiety,  (Hypo) Manic Symptoms:  Impulsivity, Irritable Mood,  Anxiety Symptoms:  Excessive Worry,  Psychotic Symptoms:  Hallucinations: None  PTSD Symptoms: Had a traumatic exposure:  "My 81 year old son sustained head injury that left him in a coma for a months"  Past Psychiatric History: Diagnosis: Polysubstance abuse and dependency  Hospitalizations: [facility] 2, [facility]  Outpatient Care: "I don't have one"  Substance Abuse Care: [facility], 2011.  Self-Mutilation: Denies   Suicidal Attempts: "Yes, I overdosed on heroin last Friday"  Violent Behaviors: None reported   Past Medical History:   Past Medical History  Diagnosis Date  . Thyroid disease   . Polysubstance (excluding opioids) dependence      Allergies:  No Known Allergies  PTA Medications: Prescriptions prior to admission  Medication Sig Dispense Refill  . doxycycline (VIBRAMYCIN) 100 MG capsule Take 100 mg by mouth 2 (two) times daily.      . DULoxetine (CYMBALTA) 20 MG capsule Take 1 capsule (20 mg total) by mouth 2 (two) times daily. For  depression and anxiety.  60 capsule  1  . gabapentin (NEURONTIN) 100 MG capsule Take 200 mg by mouth 2 (two) times daily. For anxiety and pain.240       . levothyroxine (SYNTHROID, LEVOTHROID) 175 MCG tablet Take 1 tablet (175 mcg total) by mouth daily. For thyroid disease.  30 tablet  0  . nicotine (NICODERM CQ - DOSED IN MG/24 HOURS) 21 mg/24hr patch Place 1 patch onto the skin daily.      . QUEtiapine (SEROQUEL)  12.5 mg TABS Take 12.5 mg by mouth at bedtime. For anxiety.      . traZODone (DESYREL) 100 MG tablet Take 1 tablet (100 mg total) by mouth at bedtime. For insomnia.  30 tablet  0     Substance Abuse History in the last 12 months: Substance Age of 1st Use Last Use Amount Specific Type  Nicotine 15 Prior to hosp 1 pack daily Cigarettes  Alcohol 14 Prior to hosp 5th of Vodka daily Liquor  Cannabis 15 Prior to hosp "I smoke daily" Marijuana  Opiates 18 Prior to hosp "I use daily" Hydrocodone, Heroin  Cocaine 18 Prior to hosp "I use as much as I can get" Crack cocaine  Methamphetamines 20 Prior to hosp    LSD Denies use     Ecstasy Denies use     Benzodiazepines 18 Prior to hosp  Xanax, clonazepam etc  Caffeine      Inhalants      Others:                         Consequences of Substance Abuse: Medical Consequences:  Liver damage, Legal Consequences:  Arrests, jail time Family Consequences:  Family discord  Social History: Current Place of Residence: [place] of Birth: [place]    Family Members: "My 2 kids"  Marital Status:  Separated  Children: 2  Sons: 1  Daughters: 1  Relationships: "I am separated from my husband"  Education:  [employer] Financial planner Problems/Performance: None reported  Religious Beliefs/Practices: None reported  History of Abuse (Emotional/Phsycial/Sexual): None reported  Occupational Experiences: English as a second language teacher History:  None.  Legal History: "I have a court date at the end of this month for wrecking my car under the influence"  Hobbies/Interests: None reported.  Family History:   Family History  Problem Relation Age of Onset  . Cancer Mother   . Alcohol abuse Father     Mental Status Examination/Evaluation: Objective:  Appearance: Disheveled  Patent attorney::  Fair  Speech:  Clear and Coherent  Volume:  Normal  Mood:  Depressed and Irritable  Affect:  Flat and Tearful  Thought Process:  Coherent  Orientation:   Full  Thought Content:  Rumination  Suicidal Thoughts:  Yes.  with intent/plan  Homicidal Thoughts:  No  Memory:  Immediate;   Good Recent;   Good Remote;   Good  Judgement:  Poor  Insight:  Present  Psychomotor Activity:  Restlessness  Concentration:  Poor  Recall:  Good  Akathisia:  No  Handed:  Right  AIMS (if indicated):     Assets:  Desire for Improvement  Sleep:  Number of Hours: 4.75     Laboratory/X-Ray: None Psychological Evaluation(s)      Assessment:    AXIS I:  Substance Induced Mood Disorder and Polysubstance abuse and dependency. AXIS II:  Deferred AXIS III:   Past Medical History  Diagnosis Date  . Thyroid disease   . Polysubstance (  excluding opioids) dependence    AXIS IV:  other psychosocial or environmental problems, problems related to social environment and Substance abuse issues. AXIS V:  11-20 some danger of hurting self or others possible OR occasionally fails to maintain minimal personal hygiene OR gross impairment in communication  Treatment Plan/Recommendations: Admit for safety and stabilization. Review and reinstate any pertinent home medications for other medical issues. Initiate Clonidine and Librium protocol. Continue current treatment plan.  Warm compress to Rt wrist prn and prn.  Treatment Plan Summary: Daily contact with patient to assess and evaluate symptoms and progress in treatment  Current Medications:  Current Facility-Administered Medications  Medication Dose Route Frequency Provider Last Rate Last Dose  . acetaminophen (TYLENOL) tablet 650 mg  650 mg Oral Q6H PRN Larena Sox, MD      . alum & mag hydroxide-simeth (MAALOX/MYLANTA) 200-200-20 MG/5ML suspension 30 mL  30 mL Oral Q4H PRN Larena Sox, MD      . chlordiazePOXIDE (LIBRIUM) capsule 25 mg  25 mg Oral Q6H PRN Larena Sox, MD      . chlordiazePOXIDE (LIBRIUM) capsule 25 mg  25 mg Oral QID Larena Sox, MD   25 mg at 04/15/12 1138   Followed by  .  chlordiazePOXIDE (LIBRIUM) capsule 25 mg  25 mg Oral TID Larena Sox, MD       Followed by  . chlordiazePOXIDE (LIBRIUM) capsule 25 mg  25 mg Oral BH-qamhs Larena Sox, MD       Followed by  . chlordiazePOXIDE (LIBRIUM) capsule 25 mg  25 mg Oral Daily Larena Sox, MD      . cloNIDine (CATAPRES) tablet 0.1 mg  0.1 mg Oral QID Larena Sox, MD   0.1 mg at 04/15/12 1138   Followed by  . cloNIDine (CATAPRES) tablet 0.1 mg  0.1 mg Oral BH-qamhs Larena Sox, MD       Followed by  . cloNIDine (CATAPRES) tablet 0.1 mg  0.1 mg Oral QAC breakfast Larena Sox, MD      . dicyclomine (BENTYL) tablet 20 mg  20 mg Oral Q6H PRN Larena Sox, MD      . doxycycline (VIBRA-TABS) tablet 100 mg  100 mg Oral Q12H Larena Sox, MD   100 mg at 04/15/12 0827  . DULoxetine (CYMBALTA) DR capsule 20 mg  20 mg Oral BID Larena Sox, MD   20 mg at 04/15/12 0828  . gabapentin (NEURONTIN) capsule 200 mg  200 mg Oral BID Larena Sox, MD   200 mg at 04/15/12 1138  . hydrOXYzine (ATARAX/VISTARIL) tablet 25 mg  25 mg Oral Q6H PRN Larena Sox, MD      . levothyroxine (SYNTHROID, LEVOTHROID) tablet 175 mcg  175 mcg Oral QAC breakfast Larena Sox, MD      . loperamide (IMODIUM) capsule 2-4 mg  2-4 mg Oral PRN Larena Sox, MD      . magnesium hydroxide (MILK OF MAGNESIA) suspension 30 mL  30 mL Oral Daily PRN Larena Sox, MD      . methocarbamol (ROBAXIN) tablet 500 mg  500 mg Oral Q8H PRN Larena Sox, MD      . multivitamin with minerals tablet 1 tablet  1 tablet Oral Daily Larena Sox, MD   1 tablet at 04/15/12 0828  . naproxen (NAPROSYN) tablet 500 mg  500 mg Oral BID PRN Larena Sox, MD      .  ondansetron (ZOFRAN-ODT) disintegrating tablet 4 mg  4 mg Oral Q6H PRN Larena Sox, MD      . thiamine (B-1) injection 100 mg  100 mg Intramuscular Once Larena Sox, MD      . thiamine (VITAMIN B-1) tablet 100 mg  100 mg Oral Daily Larena Sox,  MD      . traZODone (DESYREL) tablet 100 mg  100 mg Oral QHS Larena Sox, MD      . DISCONTD: hydrOXYzine (ATARAX/VISTARIL) tablet 25 mg  25 mg Oral Q6H PRN Larena Sox, MD      . DISCONTD: loperamide (IMODIUM) capsule 2-4 mg  2-4 mg Oral PRN Larena Sox, MD      . DISCONTD: ondansetron (ZOFRAN-ODT) disintegrating tablet 4 mg  4 mg Oral Q6H PRN Larena Sox, MD       Facility-Administered Medications Ordered in Other Encounters  Medication Dose Route Frequency Provider Last Rate Last Dose  . diphenhydrAMINE (BENADRYL) injection 50 mg  50 mg Intramuscular Once Ward Givens, MD   50 mg at 04/14/12 2342  . metoCLOPramide (REGLAN) injection 10 mg  10 mg Intramuscular Once Ward Givens, MD   10 mg at 04/14/12 2342  . DISCONTD: acetaminophen (TYLENOL) tablet 650 mg  650 mg Oral Q4H PRN Shaaron Adler, PA-C      . DISCONTD: alum & mag hydroxide-simeth (MAALOX/MYLANTA) 200-200-20 MG/5ML suspension 30 mL  30 mL Oral PRN Shaaron Adler, PA-C      . DISCONTD: ibuprofen (ADVIL,MOTRIN) tablet 600 mg  600 mg Oral Q8H PRN Shaaron Adler, PA-C   600 mg at 04/14/12 2132  . DISCONTD: LORazepam (ATIVAN) tablet 1 mg  1 mg Oral Q8H PRN Shaaron Adler, PA-C   1 mg at 04/14/12 1333  . DISCONTD: nicotine (NICODERM CQ - dosed in mg/24 hours) patch 21 mg  21 mg Transdermal Daily Shaaron Adler, PA-C   21 mg at 04/14/12 1333  . DISCONTD: ondansetron (ZOFRAN) tablet 4 mg  4 mg Oral Q8H PRN Shaaron Adler, PA-C      . DISCONTD: zolpidem (AMBIEN) tablet 5 mg  5 mg Oral QHS PRN Shaaron Adler, PA-C        Observation Level/Precautions:  Q 15 minute checks for safety  Laboratory:  Per ED lab findings: (+) Amphetamine, Benzodiazepine, Opiates, Cocaine, THC  Psychotherapy:  Group  Medications: See medication lists    Routine PRN Medications:  Yes  Consultations:  None indicated at this time  Discharge Concerns:  Maintaining sobriety  Other:      Armandina Stammer I 7/18/201311:51 AM

## 2012-04-15 NOTE — Progress Notes (Signed)
D.  Pt. Reports right hand pain from IV placed by EMS prior to her hospitalization. Slight swelling noted to right hand. Pain rated as a 5. A. Naproxen 500 mg. Po given.  R.  Pain rated as a 3 within the hour.

## 2012-04-15 NOTE — Progress Notes (Signed)
D   Pt admitted is a 41 year old female with polysubstance abuse and dependence  And depression with suicidal ideation   Her plan was to overdose on heroine   She regularly uses ETOH crack cocaine heroine and her uds was positive for amphetamines  Pt appears depressed and hopeless   She is lethargic due to getting medications prior to coming to [facility] and is on fall precautions    She was evicted from her home by her father whom she was living with   She also got fired from her job   She was [facility] and left treatment early due to her son taking ill and having a seizure which required hospitalization   A   Oriented to the unit and program   Offered nourishment   Discussed safety issues regarding her suicidal ideation and current sedation due to medications  Verbal support given R   Pt contracts for safety and remains safe at present time

## 2012-04-15 NOTE — Progress Notes (Signed)
Psychoeducational Group Note  Date:  04/15/2012 Time:  1100  Group Topic/Focus:  Building Self Esteem:   The Focus of this group is helping patients become aware of the effects of self-esteem on their lives, the things they and others do that enhance or undermine their self-esteem, seeing the relationship between their level of self-esteem and the choices they make and learning ways to enhance self-esteem.  Participation Level:  Did Not Attend    Additional Comments:  Pt did not attend group. Staff attempted to get pt up for group but pt stated that she was not feeling well enough to attend.  Sharyn Lull 04/15/2012, 2:21 PM

## 2012-04-15 NOTE — Progress Notes (Signed)
[facility] Group Notes:  (Counselor/Nursing/MHT/Case Management/Adjunct)  04/15/2012 7:18 PM  Type of Therapy:  Group Therapy 1:15 to 2:30  Participation Level:  None, patient came in for last 15 minutes of group  Participation Quality:  Attentive  Affect:  Depressed  Cognitive:  Alert  Insight:  None  Engagement in Group:  Limited   Clide Dales 04/15/2012, 7:18 PM

## 2012-04-15 NOTE — [facility] Suicide Risk Assessment (Signed)
Suicide Risk Assessment  Admission Assessment      Demographic factors: Divorced or widowed;Caucasian;Unemployed  Current Mental Status Per Physician: Patient seen and evaluated. Chart reviewed. Patient stated that her mood was "not good". Her affect was mood congruent, anxious and upset about relapse.  Recent stress with son, family and employer. Pt was at [facility] for 90 Day Extended Tx Program in 2012. She denied any current thoughts of self injurious behavior, suicidal ideation or homicidal ideation.  Nevertheless, has SI prior to admission and attempted to OD on heroin. There were no auditory or visual hallucinations, paranoia, delusional thought processes, or mania noted at this time. Thought process was linear and goal directed. Speech was normal rate, tone and volume. Eye contact was fair. Judgment and insight are limited. Patient has been up and limitedly engaged on the unit.   Loss Factors: Financial problems / change in socioeconomic status  Historical Factors: Prior suicide attempts; s/p recent OD on heroin; recent d/c from unit last wk  Risk Reduction Factors: Living with another person, especially a relative  Discharge Diagnoses: Polysubstance Dependence; Benzo, Alcohol and Opioid W/D; GAD; Hypothyroidism; GERD; Asthma; Chronic Pain; r/o Sleep Apnea; SIMD; r/o PD NOS with Cluster B Traits  Cognitive Features That Contribute To Risk: limited insight; impulsivity.  Suicide Risk: Pt viewed as a chronic increased risk of harm to self in light of her past hx and risk factors.  No acute safety concerns noted on the unit.  Pt contracting for safety and in need of crisis stabilization.  Plan Of Care/Follow-up recommendations:  Residential Tx imperative s/p detox this admission. Pt readmitted for crisis stabilization, detox and further treatment.  Please see orders. Naproxen for R hand pain and consider IM/Neurology consult if s/s don't improve 2/2 decrease in swelling. Medications reviewed with  pt and medication education provided.  Will continue q15 minute checks per unit protocol.  No clinical indication for one on one level of observation at this time.  Pt contracting for safety on unit.  Mental health treatment, medication management and continued sobriety will mitigate against the increased risk of harm to self and/or others.  Discussed the importance of recovery with pt, as well as, tools to move forward in a healthy & safe manner.  Pt agreeable with the plan.  Discussed with the team.   Lupe Carney 04/15/2012, 4:32 PM

## 2012-04-15 NOTE — Progress Notes (Signed)
Pt did not attend d/c planning group on this date.  SW met with pt individually at this time.  Pt was resting in her bed.  Pt states that she doesn't feel well and has a headache.  Pt states that she tried to kill herself and was at [facility] on Friday - Sunday.  Pt states that she had a seizure as well.  Pt states that she also uses marijuana, alcohol and cocaine.  Pt states that she relapsed and used again when she went back home Sunday and is here again.  Pt states that she was staying with her dad in [place] but is not able to return there.  Pt states that she is homeless now.  Pt states that she is open to going to long term treatment after detox.  SW will assess for appropriate referrals.  No further needs voiced by pt at this time.    Reyes Ivan, LCSWA 04/15/2012  1:18 PM

## 2012-04-15 NOTE — Progress Notes (Signed)
Adult Psychosocial Assessment Update Interdisciplinary Team  [facility] admissions/discharges:  Admissions Discharges  Date: 04/02/12 Date: 04/08/12  Date: Date:  Date: Date:  Date: Date:  Date: Date:   Changes since the last Psychosocial Assessment (including adherence to outpatient mental health and/or substance abuse treatment, situational issues contributing to decompensation and/or relapse).   Patient was recently released from [facility] within the last week and reports using crack,    Alcohol and heroin within 24 hours of discharge.  Barbara Key was fired from job and evicted    From father's home.  She experienced suicidal ideation with plan to overdose on her-   oin and sought help again.        Discharge Plan 1. Will you be returning to the same living situation after discharge?   Yes: No:   X   If no, what is your plan?      Cannot return to father's home; open to suggestions       2. Would you like a referral for services when you are discharged? Yes: X    If yes, for what services?  No:         Housing. Treatment, support services       Summary and Recommendations (to be completed by the evaluator)   Patient is 41 YO unemployed caucasian female admitted with diagnosis of Depressive    Disorder, NOS and Polysubstance Dependence.  Patient was recently discharged    and relapsed on multiple substances within 24 hours.  Patient will benefit from crisis     Stabilization, medication evaluation, group therapy and psycho education in addition to   case management for discharge planning.                  Signature:  Clide Dales, 04/15/2012 10:00 AM

## 2012-04-16 MED ORDER — GABAPENTIN 300 MG PO CAPS
300.0000 mg | ORAL_CAPSULE | Freq: Three times a day (TID) | ORAL | Status: DC
  Administered 2012-04-16 – 2012-04-22 (×17): 300 mg via ORAL
  Filled 2012-04-16 (×23): qty 1

## 2012-04-16 NOTE — Progress Notes (Signed)
[facility] Group Notes:  (Counselor/Nursing/MHT/Case Management/Adjunct)  04/16/2012 3:15 PM  Type of Therapy:  Group Therapy at 1:15 to 2:30  Participation Level:  Active  Participation Quality:  Drowsy and Tearful  Affect:  Anxious and Depressed  Cognitive:  Oriented  Insight:  Limited  Engagement in Group:  Limited  Engagement in Therapy:  Limited  Modes of Intervention:  Clarification, Socialization and Support  Summary of Progress/Problems: Group session included an educational portion on Post Acute Withdrawal Syndrome (PAWS) and a processing portion on feelings about relapse and what, if anything, is the motive for recovery.  Hortensia shared how she has not yet made it through a complete PAWS of 2 years which may in fact be due to her early methods of use at age 87.  Jaslyn shared feelings of hopelessness and despair yet also was open to seeking further treatment.  Patient nodded off at several points yet was easily redirectable.      Clide Dales 04/16/2012, 3:19 PM

## 2012-04-16 NOTE — Progress Notes (Signed)
[facility] Family/Significant Other Suicide Prevention Education  Suicide Prevention Education:  Patient planning to  Discharge to Other Healthcare Facility:  Suicide Prevention Education Not Provided: {PT. PLAN TO DISCHARGE TO OTHER HEALTHCARE FACILITY:SUICIDE PREVENTION EDUCATION NOT PROVIDED ([facility]):  The patient is discharging to another healthcare facility for continuation of treatment.  The patient's medical information, including suicide ideations and risk factors, are a part of the medical information shared with the receiving healthcare facility.  Clide Dales 04/16/2012, 3:40 PM

## 2012-04-16 NOTE — Progress Notes (Signed)
[facility] Group Notes:  (Counselor/Nursing/MHT/Case Management/Adjunct)  04/16/2012 2:23 PM  Type of Therapy:  Psychoeducational Skills  Participation Level:  Active  Participation Quality:  Appropriate, Attentive, Sharing and Supportive  Affect:  Appropriate and Excited  Cognitive:  Alert and Appropriate  Insight:  Good  Engagement in Group:  Good  Modes of Intervention:  Activity  Summary of Progress/Problems:Pt participated in Pensions consultant. Pt got up and interacted with all the pts in group in order to get to know everyone better. Pt completed activity by having a discussion about how this activity can after discharge.      Dalia Heading 04/16/2012, 2:23 PM

## 2012-04-16 NOTE — Progress Notes (Signed)
Patient ID: Barbara Key, female   DOB: 1970-11-06, 41 y.o.   MRN: [id number] Se has been up and to groups interacting with peers and staff. Has not requested any prn's. Self inventory : depressed at 9 hopeless at 10 SI thoughts on and off. Dr aware of this.

## 2012-04-16 NOTE — Progress Notes (Signed)
Pt did not attend d/c planning group on this date.  SW met with pt individually at this time.  Pt was observed up and attending groups today.  Pt presents with flat affect and depressed mood.  Pt reports still feeling down and hopeless due to her situation.  Pt states that she's been living out of her car and has no family or friends for support.  Pt rates depression at a 9 and anxiety at an 8.  Pt endorses on and off SI but contracts for safety on the unit.  SW discussed treatment options with pt.  Pt was open to going to [facility] [place] for further treatment.  SW will make this referral today.  No further needs voiced by pt at this time.    Reyes Ivan, LCSWA 04/16/2012  2:45 PM

## 2012-04-16 NOTE — Progress Notes (Signed)
[facility] MD Progress Note  04/16/2012 2:06 PM  S: "I feel so sad. I need to go home to see my children. I miss them. I tried to call to check on them, but my husband would not answer the phone. I feel so anxious. Can you check my medicines and help me out?"  Diagnosis:   Axis I: Polysubstance dependence including opioid type drug, episodic abuse Axis II: Deferred Axis III:  Past Medical History  Diagnosis Date  . Thyroid disease   . Polysubstance (excluding opioids) dependence    Axis IV: other psychosocial or environmental problems Axis V: 51-60 moderate symptoms  ADL's:  Poor  Sleep: Good  Appetite:  Fair  Suicidal Ideation: "Yes, off and on" Plan:  No Intent:  no Means:  no Homicidal Ideation:  Plan:  No Intent:  no Means:  no  AEB (as evidenced by): per patient reports  Mental Status Examination/Evaluation: Objective:  Appearance: Disheveled  Eye Contact::  Fair  Speech:  Clear and Coherent  Volume:  Normal  Mood:  Anxious and Depressed  Affect:  Flat and Tearful  Thought Process:  Coherent  Orientation:  Full  Thought Content:  Rumination  Suicidal Thoughts:  Yes.  without intent/plan  Homicidal Thoughts:  No  Memory:  Immediate;   Good Recent;   Good Remote;   Good  Judgement:  Poor  Insight:  Lacking  Psychomotor Activity:  Normal  Concentration:  Poor  Recall:  Good  Akathisia:  No  Handed:  Right  AIMS (if indicated):     Assets:  Desire for Improvement  Sleep:  Number of Hours: 6.25    Vital Signs:Blood pressure 95/65, pulse 86, temperature 97.6 F (36.4 C), temperature source Oral, resp. rate 18, height 5\' 7"  (1.702 m), weight 74.844 kg (165 lb). Current Medications: Current Facility-Administered Medications  Medication Dose Route Frequency Provider Last Rate Last Dose  . acetaminophen (TYLENOL) tablet 650 mg  650 mg Oral Q6H PRN Larena Sox, MD      . alum & mag hydroxide-simeth (MAALOX/MYLANTA) 200-200-20 MG/5ML suspension 30 mL  30 mL  Oral Q4H PRN Larena Sox, MD      . chlordiazePOXIDE (LIBRIUM) capsule 25 mg  25 mg Oral Q6H PRN Larena Sox, MD      . chlordiazePOXIDE (LIBRIUM) capsule 25 mg  25 mg Oral QID Larena Sox, MD   25 mg at 04/16/12 1202   Followed by  . chlordiazePOXIDE (LIBRIUM) capsule 25 mg  25 mg Oral TID Larena Sox, MD       Followed by  . chlordiazePOXIDE (LIBRIUM) capsule 25 mg  25 mg Oral BH-qamhs Larena Sox, MD       Followed by  . chlordiazePOXIDE (LIBRIUM) capsule 25 mg  25 mg Oral Daily Larena Sox, MD      . cloNIDine (CATAPRES) tablet 0.1 mg  0.1 mg Oral QID Larena Sox, MD   0.1 mg at 04/16/12 1202   Followed by  . cloNIDine (CATAPRES) tablet 0.1 mg  0.1 mg Oral BH-qamhs Larena Sox, MD       Followed by  . cloNIDine (CATAPRES) tablet 0.1 mg  0.1 mg Oral QAC breakfast Larena Sox, MD      . dicyclomine (BENTYL) tablet 20 mg  20 mg Oral Q6H PRN Larena Sox, MD      . doxycycline (VIBRA-TABS) tablet 100 mg  100 mg Oral Q12H Larena Sox, MD  100 mg at 04/16/12 0629  . DULoxetine (CYMBALTA) DR capsule 20 mg  20 mg Oral BID Larena Sox, MD   20 mg at 04/16/12 0858  . gabapentin (NEURONTIN) capsule 300 mg  300 mg Oral TID Sanjuana Kava, NP      . hydrOXYzine (ATARAX/VISTARIL) tablet 25 mg  25 mg Oral Q6H PRN Larena Sox, MD   25 mg at 04/16/12 0904  . levothyroxine (SYNTHROID, LEVOTHROID) tablet 175 mcg  175 mcg Oral QAC breakfast Larena Sox, MD   175 mcg at 04/16/12 0629  . loperamide (IMODIUM) capsule 2-4 mg  2-4 mg Oral PRN Larena Sox, MD      . magnesium hydroxide (MILK OF MAGNESIA) suspension 30 mL  30 mL Oral Daily PRN Larena Sox, MD      . methocarbamol (ROBAXIN) tablet 500 mg  500 mg Oral Q8H PRN Larena Sox, MD   500 mg at 04/15/12 1659  . multivitamin with minerals tablet 1 tablet  1 tablet Oral Daily Larena Sox, MD   1 tablet at 04/16/12 0858  . naproxen (NAPROSYN) tablet 500 mg  500 mg Oral BID  PRN Larena Sox, MD   500 mg at 04/16/12 0902  . nicotine (NICODERM CQ - dosed in mg/24 hours) patch 21 mg  21 mg Transdermal Daily Alyson Kuroski-Mazzei, DO   21 mg at 04/16/12 0859  . ondansetron (ZOFRAN-ODT) disintegrating tablet 4 mg  4 mg Oral Q6H PRN Larena Sox, MD      . thiamine (B-1) injection 100 mg  100 mg Intramuscular Once Larena Sox, MD      . thiamine (VITAMIN B-1) tablet 100 mg  100 mg Oral Daily Larena Sox, MD   100 mg at 04/16/12 0858  . traZODone (DESYREL) tablet 100 mg  100 mg Oral QHS Larena Sox, MD   100 mg at 04/15/12 2225  . DISCONTD: gabapentin (NEURONTIN) capsule 200 mg  200 mg Oral BID Larena Sox, MD   200 mg at 04/16/12 1610    Lab Results: No results found for this or any previous visit (from the past 48 hour(s)).  Physical Findings: AIMS: Facial and Oral Movements Muscles of Facial Expression: None, normal Lips and Perioral Area: None, normal Jaw: None, normal Tongue: None, normal,Extremity Movements Upper (arms, wrists, hands, fingers): None, normal Lower (legs, knees, ankles, toes): None, normal, Trunk Movements Neck, shoulders, hips: None, normal, Overall Severity Severity of abnormal movements (highest score from questions above): None, normal Incapacitation due to abnormal movements: None, normal Patient's awareness of abnormal movements (rate only patient's report): No Awareness, Dental Status Current problems with teeth and/or dentures?: No Does patient usually wear dentures?: No  CIWA:  CIWA-Ar Total: 1  COWS:  COWS Total Score: 2   Treatment Plan Summary: Daily contact with patient to assess and evaluate symptoms and progress in treatment Medication management  Plan: Increased Neurontin from 200 mg to 300 mg tid for anxiety. Continue current treatment.  Armandina Stammer I 04/16/2012, 2:06 PM

## 2012-04-16 NOTE — Progress Notes (Signed)
D: Patient resting in bed on left side with eyes closed.  Respirations even and unlabored. Patient in no apparent distress. A: Staff to monitor Q 15 mins for safety R: Patient remains safe on the unit at this time.

## 2012-04-17 ENCOUNTER — Encounter (HOSPITAL_COMMUNITY): Payer: Self-pay | Admitting: Emergency Medicine

## 2012-04-17 DIAGNOSIS — F1994 Other psychoactive substance use, unspecified with psychoactive substance-induced mood disorder: Secondary | ICD-10-CM

## 2012-04-17 LAB — CULTURE, BLOOD (ROUTINE X 2): Culture: NO GROWTH

## 2012-04-17 NOTE — Progress Notes (Signed)
  Barbara Key is a 41 y.o. female [id number] 09/03/1971  04/15/2012 Active Problems:  Polysubstance dependence including opioid type drug, episodic abuse  Drug-induced mood disorder  Mental Status: Alert but concerned about her R hand. Denies SI/HI/AVH.   Subjective/Objective: Had an IV infiltrate R hand at [facility]. Asking about Neurology consult.Had Mrs.NWOKO NP reexamine as she examined the day of admission. R hand is discolored and pt c/o numbness.    Filed Vitals:   04/17/12 0721  BP: 89/61  Pulse: 92  Temp:   Resp:     Lab Results:   BMET    Component Value Date/Time   NA 136 04/14/2012 1215   K 3.6 04/14/2012 1215   CL 101 04/14/2012 1215   CO2 22 04/14/2012 1215   GLUCOSE 122* 04/14/2012 1215   BUN 19 04/14/2012 1215   CREATININE 0.81 04/14/2012 1215   CALCIUM 9.9 04/14/2012 1215   GFRNONAA >90 04/14/2012 1215   GFRAA >90 04/14/2012 1215    Medications:  Scheduled:     . chlordiazePOXIDE  25 mg Oral QID   Followed by  . chlordiazePOXIDE  25 mg Oral TID   Followed by  . chlordiazePOXIDE  25 mg Oral BH-qamhs   Followed by  . chlordiazePOXIDE  25 mg Oral Daily  . cloNIDine  0.1 mg Oral QID   Followed by  . cloNIDine  0.1 mg Oral BH-qamhs   Followed by  . cloNIDine  0.1 mg Oral QAC breakfast  . doxycycline  100 mg Oral Q12H  . DULoxetine  20 mg Oral BID  . gabapentin  300 mg Oral TID  . levothyroxine  175 mcg Oral QAC breakfast  . multivitamin with minerals  1 tablet Oral Daily  . nicotine  21 mg Transdermal Daily  . thiamine  100 mg Intramuscular Once  . thiamine  100 mg Oral Daily  . traZODone  100 mg Oral QHS  . DISCONTD: gabapentin  200 mg Oral BID     PRN Meds acetaminophen, alum & mag hydroxide-simeth, chlordiazePOXIDE, dicyclomine, hydrOXYzine, loperamide, magnesium hydroxide, methocarbamol, naproxen, ondansetron  Plan: will send to ED at [facility] for eval. May have a blood clot in R lower forearm.  Daeshaun Specht,MICKIE D. 04/17/2012

## 2012-04-17 NOTE — Progress Notes (Signed)
Patient ID: Barbara Key, female   DOB: [birth year], 41 y.o.   MRN: [id number] Pt. attended and participated in aftercare planning group. Pt. accepted information on suicide prevention, warning signs to look for with suicide and crisis line numbers to use. The pt. agreed to call crisis line numbers if having warning signs or having thoughts of suicide. Pt. listed their current anxiety level as 7 and depression level as a 8 on a scale of 1 to 10 with 10 being the high.  Pt. stated that levels are high due to the fact that she is now homeless and family is upset with her.  Pt. indicated that referral is being made to [facility] in [place].  Pt. also indicated that [facility] doctor was to make an appointment with a neurologist and will she be seen before discharge. Case manger indicated that there is a possibility that the appointment maybe scheduled after discharge and it will depend on the availability of that neurologist.

## 2012-04-17 NOTE — ED Provider Notes (Signed)
History     CSN: [id number]  Arrival date & time 04/17/12  1344   None     Chief Complaint  Patient presents with  . Wrist Pain    (Consider location/radiation/quality/duration/timing/severity/associated sxs/prior treatment) Patient is a 41 y.o. female presenting with wrist pain. The history is provided by the patient. No language interpreter was used.  Wrist Pain This is a chronic problem. The current episode started in the past 7 days. The problem occurs daily. The problem has been gradually worsening. Associated symptoms include weakness. Pertinent negatives include no nausea or vomiting.   Patient reports IV infiltration unknown substance to L hand 7 days ago at [facility] and now her wrist is limp with numbness to the thumb and L lateral arm on the radial side.  Second time here in a week.  Did not follow up with orthopedist.  Coming from behavioral health where she is being treated for  polysubstance abuse.  No c/o pain.  Past Medical History  Diagnosis Date  . Thyroid disease   . Polysubstance (excluding opioids) dependence     Past Surgical History  Procedure Date  . Foot surgery 3/13; 06/12    to repair a deformity: plate and screws to Rt then Lt foot  . Tubal ligation   . Endometrial ablation     Family History  Problem Relation Age of Onset  . Cancer Mother   . Alcohol abuse Father     History  Substance Use Topics  . Smoking status: Current Everyday Smoker -- 1.0 packs/day for 26 years    Types: Cigarettes  . Smokeless tobacco: Never Used  . Alcohol Use: Yes     one pint Vodka per day    OB History    Grav Para Term Preterm Abortions TAB SAB Ect Mult Living                  Review of Systems  HENT: Negative.   Eyes: Negative.   Respiratory: Negative.   Cardiovascular: Negative.   Gastrointestinal: Negative.  Negative for nausea and vomiting.  Musculoskeletal:       Wrist drop  Neurological: Positive for weakness.    Psychiatric/Behavioral: Negative.   All other systems reviewed and are negative.    Allergies  Review of patient's allergies indicates no known allergies.  Home Medications   Current Outpatient Rx  Name Route Sig Dispense Refill  . DOXYCYCLINE HYCLATE 100 MG PO CAPS Oral Take 100 mg by mouth 2 (two) times daily.    . DULOXETINE HCL 20 MG PO CPEP Oral Take 1 capsule (20 mg total) by mouth 2 (two) times daily. For depression and anxiety. 60 capsule 1  . GABAPENTIN 100 MG PO CAPS Oral Take 200 mg by mouth 2 (two) times daily. For anxiety and pain.240     . LEVOTHYROXINE SODIUM 175 MCG PO TABS Oral Take 1 tablet (175 mcg total) by mouth daily. For thyroid disease. 30 tablet 0  . NICOTINE 21 MG/24HR TD PT24 Transdermal Place 1 patch onto the skin daily.    . QUETIAPINE 12.5 MG HALF TABLET Oral Take 12.5 mg by mouth at bedtime. For anxiety.    . TRAZODONE HCL 100 MG PO TABS Oral Take 1 tablet (100 mg total) by mouth at bedtime. For insomnia. 30 tablet 0    BP 104/66  Pulse 70  Temp 98 F (36.7 C) (Oral)  Resp 18  Ht 5\' 7"  (1.702 m)  Wt 165 lb (74.844 kg)  BMI 25.84 kg/m2  Physical Exam  Nursing note and vitals reviewed. Constitutional: She is oriented to person, place, and time. She appears well-developed and well-nourished.  HENT:  Head: Normocephalic and atraumatic.  Eyes: Conjunctivae and EOM are normal. Pupils are equal, round, and reactive to light.  Neck: Normal range of motion. Neck supple.  Cardiovascular: Normal rate.   Pulmonary/Chest: Effort normal.  Abdominal: Soft.  Musculoskeletal: Normal range of motion. She exhibits no edema and no tenderness.       L wrist flaccid with no ROM Warm to touch no erythema or noticeable infiltration Good radial pulse.  Can move fingers.  Neurological: She is alert and oriented to person, place, and time. She has normal reflexes.  Skin: Skin is warm and dry.  Psychiatric: She has a normal mood and affect.    ED Course   Procedures (including critical care time) Spokw with Dr. Amanda Pea who would be glad to see her.  Recommends that she go to [facility] and find out what infiltrated and possibly have Dr. Janee Morn their hand surgeon see the patient.   Labs Reviewed - No data to display No results found.   1. Polysubstance dependence including opioid type drug, episodic abuse   2. Drug-induced mood disorder   3. Hypothyroid       MDM   Coming from behavior health with c/o L wrist numbness and flaccidity after IV infiltration 1 week ago.  Hand splint applied with follow up.         Remi Haggard, NP 04/19/12 1909

## 2012-04-17 NOTE — ED Notes (Signed)
Pt was at [facility] Friday a week ago (7/12) for seizure.  Pt had IV placed in right wrist.  Pt states she has had pain and swelling in right wrist since, placed on Doxycycline which has not improved sx.  Pt noted to have mild swelling of right wrist.  Right radial pulse slightly weaker than left.  Pt with full ROM but increased pain with movement.

## 2012-04-17 NOTE — Progress Notes (Addendum)
D   Pt is seen OOB UAL on the unit today. She makes good eye contact. She is flat, sad and depressed. She interacts appropriately with staff and other pts. She completes her self inventory and on it  She writes she rates her depression and hopelessness " 8 / 10" respectively, she writes she " needs to see a nurologist or speak to an MD" and she shares that she is having numbness and tingling in her right lower forearm / wrist. Pt reports having an IV infiltrate in this area when she was in the ED recently and states it has been bothering her since then.   A She attends her group this morning and is engaged in the discussion. She is compliant with her medications.    R Safety is maintained and therapeutic relationship  Is fostered. POC is to send  Pt to ED today for neuro work-up on right wrist numbness / tingling / discoloration. PD RN [facility]

## 2012-04-17 NOTE — Progress Notes (Signed)
Psychoeducational Group Note  Date:  04/17/2012 Time:  1315   Group Topic/Focus:  Identifying Needs:   The focus of this group is to help patients identify their personal needs that have been historically problematic and identify healthy behaviors to address their needs.  Participation Level: Did Not Attend  Participation Quality:  Not Applicable  Affect:  Not Applicable  Cognitive:  Not Applicable  Insight:  Not Applicable  Engagement in Group: Not Applicable  Additional Comments:  Pt was unable to attend the 1315 Group because she was off-unit at [facility].  Christ Kick 04/17/2012, 10:08 PM

## 2012-04-17 NOTE — Progress Notes (Signed)
Pt. Resting quietly.  No signs of distress or discomfort noted at this time.

## 2012-04-18 MED ORDER — HYDROXYZINE HCL 25 MG PO TABS
25.0000 mg | ORAL_TABLET | ORAL | Status: DC | PRN
  Administered 2012-04-18: 25 mg via ORAL

## 2012-04-18 MED ORDER — NALTREXONE HCL 50 MG PO TABS
50.0000 mg | ORAL_TABLET | Freq: Every day | ORAL | Status: DC
  Administered 2012-04-19 – 2012-04-22 (×4): 50 mg via ORAL
  Filled 2012-04-18 (×7): qty 1

## 2012-04-18 MED ORDER — TRAZODONE HCL 50 MG PO TABS
50.0000 mg | ORAL_TABLET | Freq: Every day | ORAL | Status: DC
  Administered 2012-04-18 – 2012-04-21 (×4): 50 mg via ORAL
  Filled 2012-04-18 (×5): qty 1

## 2012-04-18 NOTE — Progress Notes (Signed)
Patient ID: Barbara Key, female   DOB: 1971/04/03, 41 y.o.   MRN: [id number]  After care: Pt. attended and participated in aftercare planning group. Pt. verbally accepted information on suicide prevention, warning signs to look for with suicide and crisis line numbers to use. The pt. agreed to call crisis line numbers if having warning signs or having thoughts of suicide. Pt. listed their current anxiety level as 8 and their current depression level as 6. Pt. shared that she is nervous that she will relapse once she leaves the hospital. Pt. needs to have orthopedic surgery and the doctor's office is very close to her dealer's home. Pt. is unsure if she needs the surgery right away or if it can wait until she's out of rehab.

## 2012-04-18 NOTE — Progress Notes (Signed)
Patient ID: Barbara Key, female   DOB: 10/01/1970, 41 y.o.   MRN: [id number]   [facility] Group Notes:  (Counselor/Nursing/MHT/Case Management/Adjunct)  04/18/2012 1:15 PM  Type of Therapy:  Group Therapy, Dance/Movement Therapy   Participation Level:  Active  Participation Quality:  Appropriate  Affect:  Appropriate  Cognitive:  Appropriate  Insight:  Good  Engagement in Group:  Good  Engagement in Therapy:  Good  Modes of Intervention:  Clarification, Problem-solving, Role-play, Socialization and Support  Summary of Progress/Problems:  Therapist and group members discussed motivation for recovery, healthy support systems, and how to ask for help when we need it. Therapist encouraged group members to share positive statements about one another and group members practiced repeating these positive affirmations. Therapist ended group by sharing the 12 promises and encouraged group members to make their own promises and positive statements. Pt. Shared that her motivation for recovery are her children. Pt. was attentive during group and shared a positive affirmation with another pt.     [name] 04/18/2012. 3:15 PM

## 2012-04-18 NOTE — Progress Notes (Signed)
Patient ID: Barbara Key, female   DOB: [birth year], 41 y.o.   MRN: [id number] Pt. attended and participated in aftercare planning group. Pt. verbally accepted information on suicide prevention, warning signs to look for with suicide and crisis line numbers to use. The pt. agreed to call crisis line numbers if having warning signs or having thoughts of suicide. Pt. listed their current anxiety level as 6 and depression level as a 8 on a scale of 1 to 10 with 10 being the high.  Pt. has concerns about how she will getting to the neurologist appointment. "Wants to know if I will be going to have an appointment as soon as I leave the hospital because I don't want to relapse on my way to [place]."

## 2012-04-18 NOTE — Progress Notes (Signed)
D  Barbara Key is up, dressed and interacting in the milieu this AM. She makes good eye contact. She takes her meds as ordered by her MD. She completes her AM self inventory and on it she writes she cont to have " off and on" SI ( but contracts with this nurse for safety), she rates her depression and hopelessness " 8 / 9 " and states her DC plan is to " go to rehab and meetings". A   She is pleasant, flat and endorses a flat, sad, affect. She wears a velcro splint and on her right forearm and reports the numbness and tingling in her right forearms " is the same", as compared to yesterday. R   Safety is in place. POC cont with therapeutic relationship fostered. PD RN [facility]

## 2012-04-18 NOTE — Progress Notes (Addendum)
Met with pt 1:1 upon her return from the ED at start of shift. Pt very frustrated with experience in ED. Felt she was treated rudely and was made to wait 4 hours without seeing any medical staff for assessment. Pt in pain and experiencing anxiety/agitation/withdrawal symptoms. Pt given emotional support and time to vent. Medicated with appropriate prn's. On reassessment, pt's pain down to a 2/10 and pt reports feeling much calmer. Splint in place on R wrist. COWS 4 (@2000 ) but decreased to a 0 after prn's administered. No SI/HI/AVH and pt is safe. Lawrence Marseilles

## 2012-04-18 NOTE — Progress Notes (Signed)
Psychoeducational Group Note  Date:  04/18/2012 Time:  1515 Group Topic/Focus:  Conflict Resolution:   The focus of this group is to discuss the conflict resolution process and how it may be used upon discharge.  Participation Level:  Active  Participation Quality:  Appropriate, Sharing and Supportive  Affect:  Appropriate  Cognitive:  Appropriate  Insight:  Good  Engagement in Group:  Good  Additional Comments:  none  Bella Brummet M 04/18/2012, 4:35 PM

## 2012-04-18 NOTE — Progress Notes (Addendum)
  Barbara Key is a 41 y.o. female [id number] [birth year]  04/15/2012 Active Problems:  Polysubstance dependence including opioid type drug, episodic abuse  Drug-induced mood disorder   Mental Status: Mood is better denies SI/HI/AVH.    Subjective/Objective: Given a R wrist brace and is hoping to see the orthopaedic doctor prior to going to [place].Is dreaming about using and feels internally anxious. Can't touch R ring or pinky  finger with thumb.    Filed Vitals:   04/17/12 2116  BP: 93/64  Pulse: 76  Temp:   Resp:     Lab Results:   BMET    Component Value Date/Time   NA 136 04/14/2012 1215   K 3.6 04/14/2012 1215   CL 101 04/14/2012 1215   CO2 22 04/14/2012 1215   GLUCOSE 122* 04/14/2012 1215   BUN 19 04/14/2012 1215   CREATININE 0.81 04/14/2012 1215   CALCIUM 9.9 04/14/2012 1215   GFRNONAA >90 04/14/2012 1215   GFRAA >90 04/14/2012 1215    Medications:  Scheduled:     . chlordiazePOXIDE  25 mg Oral TID   Followed by  . chlordiazePOXIDE  25 mg Oral BH-qamhs   Followed by  . chlordiazePOXIDE  25 mg Oral Daily  . cloNIDine  0.1 mg Oral QID   Followed by  . cloNIDine  0.1 mg Oral BH-qamhs   Followed by  . cloNIDine  0.1 mg Oral QAC breakfast  . doxycycline  100 mg Oral Q12H  . DULoxetine  20 mg Oral BID  . gabapentin  300 mg Oral TID  . levothyroxine  175 mcg Oral QAC breakfast  . multivitamin with minerals  1 tablet Oral Daily  . nicotine  21 mg Transdermal Daily  . thiamine  100 mg Intramuscular Once  . thiamine  100 mg Oral Daily  . traZODone  100 mg Oral QHS     PRN Meds acetaminophen, alum & mag hydroxide-simeth, chlordiazePOXIDE, dicyclomine, hydrOXYzine, loperamide, magnesium hydroxide, methocarbamol, naproxen, ondansetron  Plan: can start Naltrexone for cravings.           Case manager will help coordinate orthopaedic  Care.  Barbara Key,Barbara D. 04/18/2012

## 2012-04-18 NOTE — Progress Notes (Signed)
[facility] Group Notes:  (Counselor/Nursing/MHT/Case Management/Adjunct)  04/17/2012 2100   Type of Therapy:  wrap up group  Participation Level:  Active  Participation Quality:  Appropriate, Attentive, Sharing and Supportive  Affect:  Flat  Cognitive:  Appropriate  Insight:  Good  Engagement in Group:  Good  Engagement in Therapy:  unknown to this Clinical research associate  Modes of Intervention:  Clarification, Education and Support  Summary of Progress/Problems:Pt reported that she feels like she has controlled her temper well today.  Pt is grateful for her two children aged 68 and 32.  Pt wants to work on "not dwelling on the past".  Pt also reported being treated badly on her visit to the ED earlier in the day.     Shelah Lewandowsky 04/18/2012, 3:13 AM

## 2012-04-18 NOTE — Progress Notes (Signed)
[facility] Adult Inpatient Family/Significant Other Suicide Prevention Education  Suicide Prevention Education:  Contact Attempts: [phone] or [phone] has been identified by the patient as the family member/significant other with whom the patient will be residing, and identified as the person(s) who will aid the patient in the event of a mental health crisis.  With written consent from the patient, two attempts were made to provide suicide prevention education, prior to and/or following the patient's discharge.  We were unsuccessful in providing suicide prevention education.  A suicide education pamphlet was given to the patient to share with family/significant other.  Date and time of first attempt: By [name] on 04/18/12 at 11: 55 am Date and time of second attempt:By [name] on 04/18/12 at 4:09pm  Barbara Key 04/18/2012, 4:11 PM

## 2012-04-19 DIAGNOSIS — G5630 Lesion of radial nerve, unspecified upper limb: Secondary | ICD-10-CM

## 2012-04-19 DIAGNOSIS — L0390 Cellulitis, unspecified: Secondary | ICD-10-CM

## 2012-04-19 DIAGNOSIS — L0291 Cutaneous abscess, unspecified: Secondary | ICD-10-CM

## 2012-04-19 MED ORDER — QUETIAPINE FUMARATE 25 MG PO TABS
25.0000 mg | ORAL_TABLET | Freq: Two times a day (BID) | ORAL | Status: DC
  Administered 2012-04-19 – 2012-04-20 (×2): 25 mg via ORAL
  Filled 2012-04-19 (×6): qty 1
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
## 2012-04-19 NOTE — Tx Team (Signed)
Interdisciplinary Treatment Plan Update (Adult)  Date:  04/19/2012  Time Reviewed:  10:30 AM   Progress in Treatment: Attending groups: Yes Participating in groups:  Yes Taking medication as prescribed: Yes Tolerating medication:  Yes Family/Significant othe contact made:  Attempts made Patient understands diagnosis:  Yes Discussing patient identified problems/goals with staff:  Yes Medical problems stabilized or resolved:  Yes Denies suicidal/homicidal ideation: Yes Issues/concerns per patient self-inventory:  None identified Other: N/A  New problem(s) identified: None Identified  Reason for Continuation of Hospitalization: Anxiety Depression Medication stabilization Suicidal ideation  Interventions implemented related to continuation of hospitalization: mood stabilization, medication monitoring and adjustment, group therapy and psycho education, safety checks q 15 mins  Additional comments: N/A  Estimated length of stay: 3-5 days  Discharge Plan: Pt wants long term treatment, SW will make appropriate referrals.    New goal(s): N/A  Review of initial/current patient goals per problem list:    1.  Goal(s): Address substance use  Met:  No  Target date: by discharge  As evidenced by: completing detox protocol and refer to appropriate treatment  2.  Goal (s): Reduce depressive and anxiety symptoms  Met:  No  Target date: by discharge  As evidenced by: Reducing depression from a 10 to a 3 as reported by pt.  Pt rates at a 9 today.   3.  Goal(s): Eliminate SI  Met:  No  Target date: by discharge  As evidenced by: Pt endorses SI.     Attendees: Patient:  Barbara Key 04/19/2012 10:32 AM   Family:     Physician:  Lupe Carney, DO 04/19/2012 10:30 AM   Nursing: Robbie Louis, RN 04/19/2012 10:30 AM   Case Manager:  Reyes Ivan, LCSWA 04/19/2012  10:30 AM   Counselor:  Ronda Fairly, LCSWA 04/19/2012  10:30 AM   Other:  Richelle Ito, LCSW 04/19/2012  10:30 AM   Other:  Chinita Greenland, RN 04/19/2012 10:32 AM  Other:     Other:      Scribe for Treatment Team:   Reyes Ivan 04/19/2012 10:30 AM

## 2012-04-19 NOTE — Progress Notes (Signed)
[facility] Group Notes:  (Counselor/Nursing/MHT/Case Management/Adjunct)  04/19/2012 3:36 PM  Type of Therapy:  Group Therapy at 1:15 to 2:30PM  Participation Level:  Active  Participation Quality:  Attentive and Sharing  Affect:  Appropriate  Cognitive:  Appropriate  Insight:  Limited  Engagement in Group:  Good  Engagement in Therapy:  Good  Modes of Intervention:  Clarification, Socialization and Support  Summary of Progress/Problems:  Group discussion focused on what patient's see as their own obstacles to recovery.  Patient shares belief that being on her own will be difficult to deal with due to inexperience; "I've always had someone to depend on yet also blame the same person, now it is all on me." Barbara Key attempted to help one patient understand what was meant by another who shared lack of remorse.  Barbara Key shared with group activities she learned to deal with anger such as "ripping phone book apart, yelling while hitting mattress with pvc pipe.  Barbara Key was in and out of group room at multiple points but did so in non disruptive manner,    Barbara Key 04/19/2012, 11:39 AM
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
## 2012-04-19 NOTE — Progress Notes (Signed)
[facility] MD Progress Note  04/19/2012 3:30 PM  S/O: Patient seen and evaluated. Chart reviewed. Patient stated that her mood was "ok". Her affect was mood congruent, anxious and upset about relapse. Recent stress with son, family and employer. Pt was at [facility] Day Extended Tx [facility] in 2012. She denied any current thoughts of self injurious behavior, suicidal ideation or homicidal ideation. Nevertheless, had SI prior to admission and attempted to OD on heroin.  Episodic SI still noted, yet pt contracting for safety on unit at this time.  There were no auditory or visual hallucinations, paranoia, delusional thought processes, or mania noted at this time. Thought process was linear and goal directed. Speech was normal rate, tone and volume. Eye contact was fair. Judgment and insight are limited. Patient has been up and limitedly engaged on the unit.   Sleep:  Number of Hours: 6.5    Vital Signs:Blood pressure 100/69, pulse 79, temperature 97.5 F (36.4 C), temperature source Oral, resp. rate 20, height 5\' 7"  (1.702 m), weight 74.844 kg (165 lb).  Lab Results: No results found for this or any previous visit (from the past 48 hour(s)).  Physical Findings: CIWA:  CIWA-Ar Total: 3  COWS:  COWS Total Score: 2   A/P: Polysubstance Dependence; Benzo, Alcohol and Opioid W/D; GAD; Hypothyroidism; GERD; Asthma; Chronic Pain; r/o Sleep Apnea; SIMD; r/o PD NOS with Cluster B Traits; R hand pain, swelling and decreased ROM s/p infiltrate  Pt evaluated at ED over wkend for R hand concerns and sent back to [facility] with a brace. Ortho consult pending. Added Seroquel 25mg  po bid for anxiety.  Pt looking to go to [place] for residential Tx.  W/d s/s "better" and mood/anxiety s/s improving.  Dispo pending.  LOS 2-3 days.  Medication education completed.  Pros, cons, risks, potential side effects and benefits were discussed with pt.  Pt agreeable with the plan.  See orders.  Discussed with team.      Lupe Carney 04/19/2012, 3:30 PM

## 2012-04-19 NOTE — Progress Notes (Signed)
[facility] Group Notes:  (Counselor/Nursing/MHT/Case Management/Adjunct)  04/19/2012 5:09 PM  Type of Therapy:  Psychoeducational Skills Self care  Participation Level:  Active  Participation Quality:  Appropriate  Affect:  Appropriate  Cognitive:  Appropriate  Insight:  Good  Engagement in Group:  Good  Engagement in Therapy:  Good  Modes of Intervention:  Support  Summary of Progress/Problems:   Barbara Key 04/19/2012, 5:09 PM

## 2012-04-19 NOTE — Progress Notes (Signed)
Pt has been up for most groups today and has been interacting with peers and staff appropriately.  She rated her depression hopelessness and anxiety all 9 on her self-inventory this morning.  She reported her sleep was fair and appetite good and energy level low.  She denied any symptoms of withdrawal except for cravings and agitation.  She did admit to having some thought of suicide but has no plan and contracts to come to staff.  She denied any A/V hallucinations.  She does have a splint to her right wrist and plans to see ortho.  She did request in treatment team this morning that she wanted to see someone in ortho before she leaves for [place].  She was started on seroquel 25 mg twice a day with her first dose at 1700 today.

## 2012-04-19 NOTE — Progress Notes (Signed)
[facility] Follow up consult note  Barbara Key:[id number] DOB: 07/07/1971 DOA: 04/15/2012 PCP: Dorrene German, MD  Original consult was performed by Dr Mahala Menghini on 04/11/12  Assessment/Plan:  Radial nerve palsy May have occurred during an unresponsive episode where the radial nerve became compressed.  Outpt OT, referral to neurology to see if any imaging is necessary (can obtain this in the hospital prior to discharge), cock up splint to prevent contractures.   Recent lymphangitis/cellulitis complete 10 day course of Doxycyline as previously planned   Polysubstance dependence including opioid type drug, episodic abuse- per psych   Drug-induced mood disorder- per psych    Brief narrative: Pt's main complaint is of a tender area in the dorsum of her right hand which is tender after an IV infiltration. It was more swollen and tender but has improved. She secondarily complains of her her right hand being "dead" with inability to move at the wrist and numbness. She also feels that her arm is numb . Again, she relates these issues to be due to the IV incident. No other neurological symptoms noted by patient.     Objective: Filed Vitals:   04/19/12 0600 04/19/12 0833 04/19/12 1200 04/19/12 1201  BP: 152/78 152/78 97/67 100/69  Pulse: 84  69 79  Temp: 97.5 F (36.4 C)     TempSrc: Oral     Resp:      Height:      Weight:       No intake or output data in the 24 hours ending 04/19/12 2122  Exam:   General:  Alert and cooperative  Cardiovascular: RRR, no murmurs  Respiratory: CTA b/l  Abdomen: soft, BS+  Neuro: right wrist drop with inability to extend her thumb- numbness in right thumb, across dorsum of arm to the elbow. Strength at elbow is intact. CN 2-12 intact.   Data Reviewed: Basic Metabolic Panel:  Lab 04/14/12 8119  NA 136  K 3.6  CL 101  CO2 22  GLUCOSE 122*  BUN 19  CREATININE 0.81  CALCIUM 9.9  MG --  PHOS --   Liver Function  Tests:  Lab 04/14/12 1215  AST 16  ALT 23  ALKPHOS 112  BILITOT 0.5  PROT 7.8  ALBUMIN 4.2   No results found for this basename: LIPASE:5,AMYLASE:5 in the last 168 hours No results found for this basename: AMMONIA:5 in the last 168 hours CBC:  Lab 04/14/12 1215  WBC 7.9  NEUTROABS --  HGB 14.2  HCT 42.8  MCV 98.8  PLT 306   Cardiac Enzymes: No results found for this basename: CKTOTAL:5,CKMB:5,CKMBINDEX:5,TROPONINI:5 in the last 168 hours BNP (last 3 results) No results found for this basename: PROBNP:3 in the last 8760 hours CBG: No results found for this basename: GLUCAP:5 in the last 168 hours  Recent Results (from the past 240 hour(s))  CULTURE, BLOOD (ROUTINE X 2)     Status: Normal   Collection Time   04/11/12  3:21 PM      Component Value Range Status Comment   Specimen Description BLOOD RIGHT ARM   Final    Special Requests BOTTLES DRAWN AEROBIC AND ANAEROBIC 10CC   Final    Culture  Setup Time 04/11/2012 21:06   Final    Culture NO GROWTH 5 DAYS   Final    Report Status 04/17/2012 FINAL   Final   CULTURE, BLOOD (ROUTINE X 2)     Status: Normal   Collection Time   04/11/12  3:25 PM      Component Value Range Status Comment   Specimen Description BLOOD RIGHT HAND   Final    Special Requests BOTTLES DRAWN AEROBIC AND ANAEROBIC 10CC   Final    Culture  Setup Time 04/11/2012 21:06   Final    Culture NO GROWTH 5 DAYS   Final    Report Status 04/17/2012 FINAL   Final      Studies: Dg Forearm Right  04/11/2012  *RADIOLOGY REPORT*  Clinical Data: Forearm pain and swelling.  Recent fall.  RIGHT FOREARM - 2 VIEW  Comparison:  None.  Findings: There is no evidence of fracture or other focal bone lesions.  Soft tissues are unremarkable. No evidence of subcutaneous emphysema or radiopaque foreign body.  IMPRESSION: Negative.  Original Report Authenticated By: Danae Orleans, M.D.   Mr Foot Right W Wo Contrast  04/02/2012  *RADIOLOGY REPORT*  Clinical Data: Right foot  pain.  Osteomyelitis.  MRI OF THE RIGHT FOREFOOT WITHOUT AND WITH CONTRAST  Technique:  Multiplanar, multisequence MR imaging was performed both before and after administration of intravenous contrast.  Contrast: 15mL MULTIHANCE GADOBENATE DIMEGLUMINE 529 MG/ML IV SOLN  Comparison: None.  Findings: Postoperative changes are present at the first tarsometatarsal junction.  This produces susceptibility artifact that obscures the area around the first tarsometatarsal junction. Although suboptimally evaluated, there appears to be either an osteotomy or K-wire tract in the second metatarsal base.  There is no effacement of fatty marrow or marrow edema on inversion recovery images in the second metatarsal base to suggest osteomyelitis.  There is no osteolysis or osteomyelitis in the visualized forefoot. Dorsal subcutaneous edema is present in the foot which is nonspecific.  No abscess is identified. The flexor and extensor tendons appear within normal limits.  Phlegmon is present in the soft tissues plantar to the distal first and second metatarsals.  This may represent an area of trauma with soft tissue inflammatory change.  IMPRESSION: 1.  No evidence of osteomyelitis or soft tissue abscess. 2.  Suboptimal evaluation of the first and second tarsometatarsal junction due to hardware artifact. 3.  Nonspecific soft tissue swelling, more prominent along the dorsal aspect of the foot. This is most commonly associated with dependent edema or cellulitis in the appropriate clinical setting. 4.  Phlegmon present in the plantar aspect of the forefoot, plantar to the first and second metatarsals.  Potentially this relates to trauma or laceration.  Original Report Authenticated By: Andreas Newport, M.D.   Dg Hand Complete Right  04/11/2012  *RADIOLOGY REPORT*  Clinical Data: Swelling of right forearm.  Evaluate soft tissues to rule out subcutaneous gas.  Fall yesterday.  RIGHT HAND - COMPLETE 3+ VIEW  Comparison: None  Findings:  Suboptimal lateral view, secondary to overlap of digits. There may be mild dorsal soft tissue swelling.  No subcutaneous gas. No acute fracture or dislocation.  Subtle lucent focus within the proximal aspect proximal phalanx of fourth digit ulnarly.  No aggressive features.  IMPRESSION:  1.  No subcutaneous gas or other acute soft tissue abnormality identified. 2.  Nonaggressive appearing lesion within the proximal fourth phalanx.  Favor enchondroma or fibrous dysplasia.  Original Report Authenticated By: Consuello Bossier, M.D.   Dg Foot Complete Right  04/02/2012  *RADIOLOGY REPORT*  Clinical Data: Anterior right foot pain for the past month. Swelling.  Right foot surgery 4 months ago.  RIGHT FOOT COMPLETE - 3+ VIEW  Comparison: None.  Findings: Dorsal and medial soft tissue swelling,  most pronounced at the level of the articulation of the first metatarsal and medial cuneiform.  K-wire and screw and plate fixation of the first metatarsal and medial cuneiform is demonstrated with some lucency surrounding the distal aspect of the plate in an area of periosteal new bone arising from the first metatarsal.  There are several tiny metallic fragments in the adjacent soft tissues.  The proximal fixation screws are bridging the medial and middle cuneiform.  No soft tissue gas is seen.  There is also some bony fragmentation between the base of the first metatarsal and second metatarsal with a linear lucency crossing the base of the second metatarsal at that level.  On the lateral view, there is a linear lucency in the ventral aspect of the one of the metatarsals, possibly the second metatarsal, with ill-defined bony margins and adjacent bony fragmentation.  IMPRESSION:  1. Postsurgical changes, as described above. 2.  Findings suggesting a nondisplaced fracture and possible osteomyelitis involving the base of the second metatarsal. 3.  Periosteal new bone adjacent to the distal aspect of the fixation plate with lucency  adjacent to the distal portion of the plate.  This could be due to plate motion or osteomyelitis. 4.  Soft tissue swelling and several tiny metallic densities in the soft tissues at the level of the surgical changes.  Original Report Authenticated By: Darrol Angel, M.D.    Scheduled Meds:   . chlordiazePOXIDE  25 mg Oral Daily  . cloNIDine  0.1 mg Oral BH-qamhs   Followed by  . cloNIDine  0.1 mg Oral QAC breakfast  . doxycycline  100 mg Oral Q12H  . DULoxetine  20 mg Oral BID  . gabapentin  300 mg Oral TID  . levothyroxine  175 mcg Oral QAC breakfast  . multivitamin with minerals  1 tablet Oral Daily  . naltrexone  50 mg Oral Daily  . nicotine  21 mg Transdermal Daily  . QUEtiapine  25 mg Oral BID  . thiamine  100 mg Intramuscular Once  . thiamine  100 mg Oral Daily  . traZODone  50 mg Oral QHS   Continuous Infusions:   ________________________________________________________________________  Time spent: 45 min    [employer]  [facility] Pager [phone] If 8PM-8AM, please contact night-coverage at [email or web address], password [facility] 04/19/2012, 9:22 PM  LOS: 4 days

## 2012-04-20 DIAGNOSIS — G5630 Lesion of radial nerve, unspecified upper limb: Secondary | ICD-10-CM

## 2012-04-20 DIAGNOSIS — F1920 Other psychoactive substance dependence, uncomplicated: Secondary | ICD-10-CM

## 2012-04-20 MED ORDER — QUETIAPINE FUMARATE 25 MG PO TABS
25.0000 mg | ORAL_TABLET | Freq: Three times a day (TID) | ORAL | Status: DC
  Administered 2012-04-20 – 2012-04-22 (×6): 25 mg via ORAL
  Filled 2012-04-20 (×8): qty 1

## 2012-04-20 MED ORDER — METHOCARBAMOL 500 MG PO TABS
500.0000 mg | ORAL_TABLET | Freq: Three times a day (TID) | ORAL | Status: DC | PRN
  Administered 2012-04-20 – 2012-04-22 (×3): 500 mg via ORAL
  Filled 2012-04-20 (×3): qty 1

## 2012-04-20 MED ORDER — NAPROXEN 500 MG PO TABS
500.0000 mg | ORAL_TABLET | Freq: Two times a day (BID) | ORAL | Status: DC
  Administered 2012-04-20 – 2012-04-22 (×4): 500 mg via ORAL
  Filled 2012-04-20 (×6): qty 1
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
## 2012-04-20 NOTE — Progress Notes (Signed)
Pt attended discharge planning group and actively participated in group.  SW provided pt with today's workbook.  Pt presents with flat affect and depressed mood.  Pt rates depression at a 4 and anxiety at a 6 today.  Pt denies SI.  Pt is very concerned about her hand being numb and states her foot is now swollen from not having shoes.  Pt states that she also found out she has gall stones.  Pt  Is concerned about the medical treatment.  Pt completed a phone interview with [facility] yesterday and was accepted there for treatment.  Pt is scheduled to d/c and go there on Thursday.  They will provide transportation there at 12:00.  Pt has belongings she wants out of her car before going to [employer].  No further needs voiced by pt at this time.    Reyes Ivan, LCSWA 04/20/2012  9:29 AM

## 2012-04-20 NOTE — Progress Notes (Signed)
[facility] Group Notes:  (Counselor/Nursing/MHT/Case Management/Adjunct)  04/20/2012 11:51 AM  Type of Therapy:  Psychoeducational Skills  Participation Level:  Active  Participation Quality:  Inattentive and Resistant  Affect:  Blunted  Cognitive:  Appropriate and Oriented  Insight:  Limited  Engagement in Group:  Limited  Engagement in Therapy:  n/a  Modes of Intervention:  Activity, Education, Limit-setting, Problem-solving, Socialization and Support  Summary of Progress/Problems:  Montzerrat attended Psychoeducational group that focused on using quality time with support systems/individuals to engage in health coping skills. Daryle participated in activity guessing about self and peers. Lillyauna was engaging in side talk with disgruntled peer while group discussed who their support systems are, how they can spend positive quality time with them as a coping skill and a way to strengthen the relationship. Sreeja was given a homework assignment to find two ways to improve her support systems and twenty activities she can do spend quality time with her supports.    Wandra Scot 04/20/2012, 11:51 AM

## 2012-04-20 NOTE — Progress Notes (Signed)
[facility] Group Notes:  (Counselor/Nursing/MHT/Case Management/Adjunct)  04/20/2012 3:32 PM  Type of Therapy:  Psychoeducational Skills  Participation Level:  Active  Participation Quality:  Appropriate  Affect:  Appropriate  Cognitive:  Appropriate  Insight:  Good  Engagement in Group:  Good  Engagement in Therapy:  Good  Modes of Intervention:  Support  Summary of Progress/Problems:   Grady Lucci Lee 04/20/2012, 3:32 PM 

## 2012-04-20 NOTE — Consult Note (Signed)
Reason for Consult: right wrist drop Referring Physician: Koren Shiver  CC: "I cant move my right hand"  HPI: Barbara Key is an 41 y.o. female who says that she overdosed on heroin on 04/09/12 and ended up having a seizure. She admits to drinking alcohol and crack as well. She was admitted to [facility] and states that when she recovered to the point that she knew what was going on, she could not move her right hand and that it has "not gotten better".  She denies any injury that she can remember. She did say that she had an infiltrated IV in that right hand while at [facility]. She was treated with abx for that. She had been seen twice at [facility] for this and was asked to f/u orthopaedist but did not followup with one.  She is to be transferred to [place] for 30 day inpatient rehab later this week.  Past Medical History  Diagnosis Date  . Thyroid disease   . Polysubstance (excluding opioids) dependence     Past Surgical History  Procedure Date  . Foot surgery 3/13; 06/12    to repair a deformity: plate and screws to Rt then Lt foot  . Tubal ligation   . Endometrial ablation     Family History  Problem Relation Age of Onset  . Cancer Mother   . Alcohol abuse Father     Social History:  reports that she has been smoking Cigarettes.  She has a 26 pack-year smoking history. She has never used smokeless tobacco. She reports that she drinks alcohol. She reports that she uses illicit drugs ("Crack" cocaine and Heroin).  No Known Allergies  Medications:  Prior to Admission:  Prescriptions prior to admission  Medication Sig Dispense Refill  . doxycycline (VIBRAMYCIN) 100 MG capsule Take 100 mg by mouth 2 (two) times daily.      . DULoxetine (CYMBALTA) 20 MG capsule Take 1 capsule (20 mg total) by mouth 2 (two) times daily. For depression and anxiety.  60 capsule  1  . gabapentin (NEURONTIN) 100 MG capsule Take 200 mg by mouth 2 (two) times daily. For anxiety and  pain.240       . levothyroxine (SYNTHROID, LEVOTHROID) 175 MCG tablet Take 1 tablet (175 mcg total) by mouth daily. For thyroid disease.  30 tablet  0  . nicotine (NICODERM CQ - DOSED IN MG/24 HOURS) 21 mg/24hr patch Place 1 patch onto the skin daily.      . QUEtiapine (SEROQUEL) 12.5 mg TABS Take 12.5 mg by mouth at bedtime. For anxiety.      . traZODone (DESYREL) 100 MG tablet Take 1 tablet (100 mg total) by mouth at bedtime. For insomnia.  30 tablet  0   Scheduled:   . cloNIDine  0.1 mg Oral BH-qamhs   Followed by  . cloNIDine  0.1 mg Oral QAC breakfast  . doxycycline  100 mg Oral Q12H  . DULoxetine  20 mg Oral BID  . gabapentin  300 mg Oral TID  . levothyroxine  175 mcg Oral QAC breakfast  . multivitamin with minerals  1 tablet Oral Daily  . naltrexone  50 mg Oral Daily  . naproxen  500 mg Oral BID WC  . nicotine  21 mg Transdermal Daily  . QUEtiapine  25 mg Oral TID  . thiamine  100 mg Intramuscular Once  . thiamine  100 mg Oral Daily  . traZODone  50 mg Oral QHS  . DISCONTD: QUEtiapine  25 mg Oral BID    ROS: History obtained from chart review and the patient  General ROS: negative for - chills, fatigue, fever, night sweats, weight gain or weight loss Psychological ROS: negative for, hallucinations, memory difficulties, mood swings, history of suicidal ideation, mental health disorder Ophthalmic ROS: negative for - blurry vision, double vision, eye pain or loss of vision ENT ROS: negative for - epistaxis, nasal discharge, oral lesions, sore throat, tinnitus or vertigo Allergy and Immunology ROS: negative for - hives or itchy/watery eyes Hematological and Lymphatic ROS: negative for - bleeding problems, bruising or swollen lymph nodes Endocrine ROS: negative for - galactorrhea, hair pattern changes, polydipsia/polyuria or temperature intolerance Respiratory ROS: negative for - cough, hemoptysis, shortness of breath or wheezing Cardiovascular ROS: negative for - chest pain,  dyspnea on exertion, edema or irregular heartbeat Gastrointestinal ROS: negative for - abdominal pain, diarrhea, hematemesis, nausea/vomiting or stool incontinence Genito-Urinary ROS: negative for - dysuria, hematuria, incontinence or urinary frequency/urgency Musculoskeletal ROS: negative for + swelling or muscular weakness right hand Neurological ROS: as noted in HPI Dermatological ROS: negative for rash and skin lesion changes   Physical Examination: Blood pressure 106/76, pulse 69, temperature 97.8 F (36.6 C), temperature source Oral, resp. rate 16, height 5\' 7"  (1.702 m), weight 74.844 kg (165 lb).  Neurologic Examination Mental Status: Alert, oriented, thought content appropriate.  Speech fluent without evidence of aphasia.  Able to follow 3 step commands without difficulty. Cranial Nerves: II: visual fields grossly normal, pupils equal, round, reactive to light III,IV, VI: ptosis not present, extra-ocular motions intact bilaterally V,VII: smile symmetric, facial light touch sensation normal bilaterally VIII: hearing normal bilaterally IX,X: gag reflex present XI: trapezius strength/neck flexion strength normal bilaterally XII: tongue strength normal  Motor: Right lower extremity and left upper extremity with 5/5 strength.  Proximally gives 5/5 strength of the RUE as well.  Unable to extend at the wrist.  Is able to flex and makes a fist when her wrist is flexed.  Although no movement noted with formal testing when patient distracted, clearly some movement noted at the wrist.   She is holding up her right wrist with her hand dropping towards floor stating that she can not use it. She has excellent passive ROM. Hand is warm, pink with palpable radial pulse. She does have evidence of previous infiltrate of hand which is shows evidence of slight swelling and fading ecchymosis.  Tone and bulk:normal tone throughout; no atrophy noted Sensory: Pinprick and light touch intact left arm,  bilateral lower extremities. Decreased pinprick and light touch in the right thumb and at the snuff box.  Medially in the hand sensation is intact.   Deep Tendon Reflexes: 2+ and symmetric throughout Plantars: Right: downgoing   Left: downgoing Cerebellar: normal gait and station    No results found for this or any previous visit (from the past 48 hour(s)).  Recent Results (from the past 240 hour(s))  CULTURE, BLOOD (ROUTINE X 2)     Status: Normal   Collection Time   04/11/12  3:21 PM      Component Value Range Status Comment   Specimen Description BLOOD RIGHT ARM   Final    Special Requests BOTTLES DRAWN AEROBIC AND ANAEROBIC 10CC   Final    Culture  Setup Time 04/11/2012 21:06   Final    Culture NO GROWTH 5 DAYS   Final    Report Status 04/17/2012 FINAL   Final   CULTURE, BLOOD (ROUTINE X 2)  Status: Normal   Collection Time   04/11/12  3:25 PM      Component Value Range Status Comment   Specimen Description BLOOD RIGHT HAND   Final    Special Requests BOTTLES DRAWN AEROBIC AND ANAEROBIC 10CC   Final    Culture  Setup Time 04/11/2012 21:06   Final    Culture NO GROWTH 5 DAYS   Final    Report Status 04/17/2012 FINAL   Final    HIV, RPR both negative 04/18/2012 report  Right forearm xray 04/11/2012 Negative.   Right hand complete 04/11/2012 No subcutaneous gas or other acute soft tissue abnormality  identified. Nonaggressive appearing lesion within the proximal fourth  phalanx. Favor enchondroma or fibrous dysplasia.    Assessment: 41 yo female with right wrist drop and  numbness in her thumb.  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  Unclear if this happened as a consequence of injury associated with her seizure or if it is related to her IV infiltration.  In any event it does seem that she is having some improvement if she was clearly unable to move it at all initially.  Further improvement is suspected over the next 4-6 weeks.    Recommendations: 1) May  continue to use wrist splint as it does not appear that it is too tight around her wrist/arm.  2) OT consult.  They should be able to give the patient a list of exercises to be performed daily.  She should be able to do these on her own while at rehab and should aid in her recovery. 3) If patient has not returned to baseline in 4-6 weeks should have an NCV/EMG at that time for further evaluation.      Job Founds, MBA, MHA [facility] Pager [phone]  04/20/2012, 4:25 PM   Patient seen and examined.  Clinical course and management discussed.  Necessary edits performed.  I agree with the above.  Thana Farr, MD [facility] [phone]  04/20/2012  7:03 PM

## 2012-04-20 NOTE — Progress Notes (Signed)
[facility] Group Notes:  (Counselor/Nursing/MHT/Case Management/Adjunct)  04/20/2012 5:53 PM  Type of Therapy:  Group Therapy from 1:15 to 2:30  Participation Level:  Minimal  Participation Quality:  Attentive and Drowsy  Affect:  Flat  Cognitive:  Oriented  Insight:  Limited  Engagement in Group:  Limited  Engagement in Therapy:  Limited  Modes of Intervention:  Education, Socialization and Support  Summary of Progress/Problems: Patient attended group presentation by Interior and spatial designer of  [employer] [place] ([employer]). Patient was obviously drowsy yet remained attentive and appropriate during session.  For the remainder of time group discussed the definition of addiction by ASAM ;  Chelise was attentive and and remained quiet.   Clide Dales 04/20/2012 5:57 PM

## 2012-04-20 NOTE — Progress Notes (Signed)
[facility] MD Progress Note  04/20/2012 12:14 PM  S/O: Patient seen and evaluated. Chart reviewed. Patient stated that her mood was "little better". Her affect was mood congruent and still constricted. She denied any current thoughts of self injurious behavior, suicidal ideation or homicidal ideation. Nevertheless, had SI prior to admission and attempted to OD on heroin. There were no auditory or visual hallucinations, paranoia, delusional thought processes, or mania noted at this time. Thought process was linear and goal directed. Speech was normal rate, tone and volume. Eye contact was fair. Judgment and insight are limited. Patient has been up and limitedly engaged on the unit. Stable with addition of Seroquel, no SEs reported.  Sleep:  Number of Hours: 6.5    Vital Signs:Blood pressure 106/76, pulse 69, temperature 97.8 F (36.6 C), temperature source Oral, resp. rate 16, height 5\' 7"  (1.702 m), weight 74.844 kg (165 lb).  Lab Results: No results found for this or any previous visit (from the past 48 hour(s)).  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 Read and reviewed Im consult, neurology being contacted by team as suggested. 2. Increased Seroquel dosing from bid to tid for improved mood stability. 3. Pt accepted to [facility], discharge Thursday.  Medication education completed.  Pros, cons, risks, potential side effects and benefits were discussed with pt.  Pt agreeable with the plan.  See orders.  Discussed with team.      Barbara Key 04/20/2012, 12:14 PM

## 2012-04-20 NOTE — Progress Notes (Signed)
04/20/2012      Time: 1500      Group Topic/Focus: The focus of this group is on discussing various styles of communication and communicating assertively using 'I' (feeling) statements.  Participation Level: Minimal  Participation Quality: Attentive  Affect: Blunted  Cognitive: Oriented   Additional Comments: Patient engaging in side conversations with a peer, but would occasionally chime in.   Miaa Latterell 04/20/2012 3:44 PM

## 2012-04-20 NOTE — Progress Notes (Signed)
Pt has been up for most groups today.  She reported her sleep fair appetite good and energy low.  She rated her depression a 7 and hopelessness a 9 and her anxiety a 3 on her self-inventory today.  She denies any symptoms of withdrawal but does admit to some agitation.  She denies any S/H ideations.  She first reported no bm but later came up to nurses station and stated,"I have had a small bowel movement but I feel I will need to take more MOM tonight"  Encouraged her to tell her nurse coming on and that this nurse would give that information.  She voiced understanding.  She plans to d/c on Thursday to [facility] in [place].  Pt has 2 consults today for her right wrist.  The neurologist is thinking about ordering PT/OT to come over and show pt some exercises for her hand.

## 2012-04-20 NOTE — Progress Notes (Signed)
D: Pt in bed resting with eyes closed. Respirations even and unlabored. Pt appears to be in no signs of distress at this time. A: Q15min checks remains for this pt. R: Pt remains safe at this time.   

## 2012-04-20 NOTE — Progress Notes (Signed)
I have reviewed Barbara Key's chart, seen and examined her at in conference room. Appreciate neurology. Not much else to offer from a medical standpoint. Barbara Key should complete course of abx as prescribed earlier. Thanks for consult. We will sign off. Please feel free to call with questions.  Simbsio Allsion Nogales,MD pager#[phone].

## 2012-04-21 NOTE — Progress Notes (Signed)
[facility] Group Notes:  (Counselor/Nursing/MHT/Case Management/Adjunct)  04/21/2012 4:46PM  Type of Therapy:  Group Therapy from 1:15 to 2:30  Participation Level:  Good  Participation Quality:  Attentive, Drowsy and Sharing  Affect:  Flat  Cognitive:  Oriented  Insight:  Limited  Engagement in Group:  Limited  Engagement in Therapy:  Limited  Modes of Intervention:  Clarification, Socialization and Support  Summary of Progress/Problems: The focus of this group session was to process how we deal with difficult emotions and share with others the patterns that play out when we are reacting to the emotion verses the situation.  Alejah shared that one of the most difficult emotions she deals with are anxiety and feeling overwhelmed due to circumstances beyond her control.  Lennis shared her anxiety about going to [facility] tomorrow and late night thinking that this may not be the best move although she was able to process her options are limited.  Another thought that she was able to identify as non helpful was feelings of "helplessness." Others in group were able to confront her with truth "just because you are without a man does not mean you are helpless."    Clide Dales 04/21/2012 4:54 PM

## 2012-04-21 NOTE — Progress Notes (Signed)
Pt attended discharge planning group and actively participated in group.  SW provided pt with today's workbook.  Pt presents with irritated affect and mood.  Pt rates depression at a 5 and anxiety at a 7 today.  Pt denies SI.  Pt states that she was seen by a neurologist yesterday and was told it could be permanent damage to her hand.  Pt states that she was told she needs to follow up after d/c.  Pt states that after receiving this bad news last night she felt like using again.  Pt states that she though about d/c and using instead of going to treatment.  Pt states that she knows she needs to go to treatment directly from here now.  Pt asked if she could use a computer to apply for unemployment.  SW explained that there wasn't a computer accessible to pts.  Pt began mumbling under her breath and was irritated with this information.  Pt is scheduled to follow up with [facility] and will be picked up tomorrow for further treatment.  No further needs voiced by pt at this time. Safety planning and suicide prevention discussed.  Pt participated in discussion and acknowledged an understanding of the information provided.       Reyes Ivan, LCSWA 04/21/2012  9:22 AM

## 2012-04-21 NOTE — ED Provider Notes (Signed)
History/physical exam/procedure(s) were performed by non-physician practitioner and as supervising physician I was immediately available for consultation/collaboration. I have reviewed all notes and am in agreement with care and plan.   Hilario Quarry, MD 04/21/12 [phone]

## 2012-04-21 NOTE — Progress Notes (Signed)
[facility] MD Progress Note  04/21/2012 4:11 PM  S: Prior to me coming into the hospital, I had an unprotected sex. I am worried that I might have contracted some STD. I would like to be checked for STDs prior to being discharged to the treatment Center tomorrow. [facility]. I saw the neurologist yesterday for my right hand yesterday. She said that I have Radial Nerve palsy. She encouraged me to continue to wear the brace".  Diagnosis:   Axis I: Polysubstance dependence including opioid type drug, episodic abuse Axis II: Deferred Axis III:  Past Medical History  Diagnosis Date  . Thyroid disease   . Polysubstance (excluding opioids) dependence    Axis IV: other psychosocial or environmental problems Axis V: 61-70 mild symptoms  ADL's:  Intact  Sleep: Good  Appetite:  Good  Suicidal Ideation:  Plan:  NO Intent:  No Means:  No Homicidal Ideation:  Plan:  No Intent:  no Means:  no  AEB (as evidenced by): Per patient's reports.  Mental Status Examination/Evaluation: Objective:  Appearance: Casual  Eye Contact::  Good  Speech:  Clear and Coherent  Volume:  Normal  Mood:  Euthymic  Affect:  Appropriate  Thought Process:  Coherent  Orientation:  Full  Thought Content:  Rumination  Suicidal Thoughts:  No  Homicidal Thoughts:  No  Memory:  Immediate;   Good Recent;   Good Remote;   Good  Judgement:  Fair  Insight:  Fair  Psychomotor Activity:  Normal  Concentration:  Good  Recall:  Good  Akathisia:  No  Handed:  Right  AIMS (if indicated):     Assets:  Communication Skills Desire for Improvement  Sleep:  Number of Hours: 5.75    Vital Signs:Blood pressure 90/61, pulse 79, temperature 98.1 F (36.7 C), temperature source Oral, resp. rate 16, height 5\' 7"  (1.702 m), weight 74.844 kg (165 lb). Current Medications: Current Facility-Administered Medications  Medication Dose Route Frequency Provider Last Rate Last Dose  .  acetaminophen (TYLENOL) tablet 650 mg  650 mg Oral Q6H PRN Larena Sox, MD      . alum & mag hydroxide-simeth (MAALOX/MYLANTA) 200-200-20 MG/5ML suspension 30 mL  30 mL Oral Q4H PRN Larena Sox, MD   30 mL at 04/18/12 2100  . cloNIDine (CATAPRES) tablet 0.1 mg  0.1 mg Oral QAC breakfast Larena Sox, MD   0.1 mg at 04/21/12 1610  . doxycycline (VIBRA-TABS) tablet 100 mg  100 mg Oral Q12H Larena Sox, MD   100 mg at 04/21/12 0807  . DULoxetine (CYMBALTA) DR capsule 20 mg  20 mg Oral BID Larena Sox, MD   20 mg at 04/21/12 0807  . gabapentin (NEURONTIN) capsule 300 mg  300 mg Oral TID Sanjuana Kava, NP   300 mg at 04/21/12 1147  . hydrOXYzine (ATARAX/VISTARIL) tablet 25 mg  25 mg Oral Q4H PRN Mickie D. Adams, PA   25 mg at 04/18/12 2000  . levothyroxine (SYNTHROID, LEVOTHROID) tablet 175 mcg  175 mcg Oral QAC breakfast Larena Sox, MD   175 mcg at 04/21/12 9604  . magnesium hydroxide (MILK OF MAGNESIA) suspension 30 mL  30 mL Oral Daily PRN Larena Sox, MD   30 mL at 04/20/12 2201  . methocarbamol (ROBAXIN) tablet 500 mg  500 mg Oral Q8H PRN Alyson Kuroski-Mazzei, DO   500 mg at 04/21/12 0809  . multivitamin with minerals tablet 1  tablet  1 tablet Oral Daily Larena Sox, MD   1 tablet at 04/21/12 0807  . naltrexone (DEPADE) tablet 50 mg  50 mg Oral Daily Mickie D. Adams, PA   50 mg at 04/21/12 0807  . naproxen (NAPROSYN) tablet 500 mg  500 mg Oral BID WC Alyson Kuroski-Mazzei, DO   500 mg at 04/21/12 0807  . nicotine (NICODERM CQ - dosed in mg/24 hours) patch 21 mg  21 mg Transdermal Daily Alyson Kuroski-Mazzei, DO   21 mg at 04/21/12 0831  . QUEtiapine (SEROQUEL) tablet 25 mg  25 mg Oral TID Alyson Kuroski-Mazzei, DO   25 mg at 04/21/12 1147  . thiamine (B-1) injection 100 mg  100 mg Intramuscular Once Larena Sox, MD      . thiamine (VITAMIN B-1) tablet 100 mg  100 mg Oral Daily Larena Sox, MD   100 mg at 04/21/12 0807  . traZODone (DESYREL) tablet  50 mg  50 mg Oral QHS Mickie D. Adams, PA   50 mg at 04/20/12 2159    Lab Results: No results found for this or any previous visit (from the past 48 hour(s)).  Physical Findings: AIMS: Facial and Oral Movements Muscles of Facial Expression: None, normal Lips and Perioral Area: None, normal Jaw: None, normal Tongue: None, normal,Extremity Movements Upper (arms, wrists, hands, fingers): None, normal Lower (legs, knees, ankles, toes): None, normal, Trunk Movements Neck, shoulders, hips: None, normal, Overall Severity Severity of abnormal movements (highest score from questions above): None, normal Incapacitation due to abnormal movements: None, normal Patient's awareness of abnormal movements (rate only patient's report): No Awareness, Dental Status Current problems with teeth and/or dentures?: No Does patient usually wear dentures?: No  CIWA:  CIWA-Ar Total: 2  COWS:  COWS Total Score: 2   Treatment Plan Summary: Daily contact with patient to assess and evaluate symptoms and progress in treatment Medication management  Plan: Obtain Chlamydia probe. HIV and RRR tests done at ED on 04/11/12. Continue current treatment plan.  Armandina Stammer I 04/21/2012, 4:11 PM

## 2012-04-21 NOTE — Progress Notes (Signed)
D:  Pt about on the unit this am.  She said she did not sleep well last pm.  She thought it was related to the consult/news she had yesterday about he hand.  She said her appetite is good and her energy level is normal.  She rates her  Depression as a 7/10 and hopelessness as a 8/10.  She complains of her hand, foot and head hurting this am.  Says per self inventory that she is experiencing some feelings of agitation as far as her withdrawal.  A:  Support and encouragement given.  Given prn Robaxin for pain.  R: Pt. receptive to staff.  Safety maintained with q 15 minute checks .  Will continue to assess.

## 2012-04-21 NOTE — Progress Notes (Signed)
Patient ID: Barbara Key, female   DOB: [birth year], 41 y.o.   MRN: [id number] D: Pt. Affect brighter,smiles, but noted some anxiety. Pt. Says "doing okay, I got a lot on my mind." Pt. Reports unsure about [facility] . "I don't know if I'm going, cause somebody was suppose to bring me some money so I can get me some cigarettes, I can't go another 30 days without cigarettes. I need some personal items."  Brace to right arm intact, fingers pink pt. Able to move fingers freely, capillary refill <2. Staff will monitor q64min for safety. A: Pt. Remains safe on the unit. Pt. Encouraged to consider that going to [facility] is an opportunity to change things in her life for the better a chance to get clean, live a drug free life. Writer also noted to pt. That each facility usually have personal hygiene products. R: Pt. Comes back to Clinical research associate and acknowledges " I was talking crazy, I talk to a friend of mine a real friend who says I need to go" "I'll be going to [facility] applauded client decision. Pt. Remains safe on the unit.

## 2012-04-22 LAB — CHLAMYDIA PROBE AMPLIFICATION, URINE: Chlamydia, Swab/Urine, PCR: NEGATIVE

## 2012-04-22 MED ORDER — QUETIAPINE FUMARATE 25 MG PO TABS: 25.0000 mg | ORAL_TABLET | Freq: Three times a day (TID) | ORAL | Status: DC

## 2012-04-22 MED ORDER — DOXYCYCLINE HYCLATE 100 MG PO TABS: 100.0000 mg | ORAL_TABLET | Freq: Two times a day (BID) | ORAL | Status: AC

## 2012-04-22 MED ORDER — GABAPENTIN 300 MG PO CAPS: 300.0000 mg | ORAL_CAPSULE | Freq: Three times a day (TID) | ORAL | Status: DC

## 2012-04-22 MED ORDER — NALTREXONE HCL 50 MG PO TABS: 50.0000 mg | ORAL_TABLET | Freq: Every day | ORAL | Status: DC

## 2012-04-22 MED ORDER — TRAZODONE HCL 50 MG PO TABS: 50.0000 mg | ORAL_TABLET | Freq: Every day | ORAL | Status: DC

## 2012-04-22 MED ORDER — DULOXETINE HCL 20 MG PO CPEP: 20.0000 mg | ORAL_CAPSULE | Freq: Two times a day (BID) | ORAL | Status: DC

## 2012-04-22 MED ORDER — LEVOTHYROXINE SODIUM 175 MCG PO TABS: 175.0000 ug | ORAL_TABLET | Freq: Every day | ORAL | Status: DC

## 2012-04-22 NOTE — Discharge Summary (Signed)
Physician Discharge Summary Note  Patient:  Barbara Key is an 41 y.o., female MRN:  [id number] DOB:  1971-05-18 Patient phone:  [phone] (home)  Patient address:   [address] [place] [address],   Date of Admission:  04/15/2012 Date of Discharge: 04/22/12  Reason for Admission: Suicidal ideations/detoxification treatment.  Discharge Diagnoses: Active Problems:  Polysubstance dependence including opioid type drug, episodic abuse  Drug-induced mood disorder  Radial nerve palsy   Axis Diagnosis:   AXIS I:  Substance Induced Mood Disorder and Polysubstance dependence including opioid type drug, episodic abuse AXIS II:  Deferred AXIS III:   Past Medical History  Diagnosis Date  . Thyroid disease   . Polysubstance (excluding opioids) dependence    AXIS IV:  Polysubstance abuse/dependency AXIS V:  65  Level of Care:  [facility]  Hospital Course:  This is a 41 year old Caucasian female, admitted to [facility] from the [facility] ED with complaints of suicidal ideations. Patient was recently discharged from the [facility] at about a week a ago on 04/07/12 after detoxification treatment. Patient was scheduled for continuation of substance abuse treatment at the [facility]. This time, patient reports, "I felt suicidal last Friday, and I overdosed on heroin. A tax driver found me lying on the side of the road on the streets and took me to [facility]. I spent one night at the hospital and was discharged. Yesterday, the suicidal thoughts came back, that was why I decided to go the [facility] ED. I have been using drugs since the age of 13. The longest that I have been sober was 8 months in 2012, and that was after my drug rehabilitation treatment at the [facility] towards the end of 2011. I use drugs to help my pain go away. I lost my whole family because of my drug use. My father disowned me, he threw me out of his house. I am practically  living in my car right now. I recently lost my job as well while I was in this hospital 2 weeks ago. My drug use caused me my marriage. I was separated from my husband 2 years ago. He has taken my 2 children from me, a son and daughter. He accused me of not taking care of them because my son had a seizure activities last Friday while I was high on drugs. The last time I saw my son was last Friday. I have a court date at the end end of this month for crashing my car on a tree under the influence of drugs".  Upon admission in this hospital, Barbara Key was started on clonidine protocol for her opiate detoxification. She was also enrolled in group counseling sessions and activities to learn coping skills. She also attended AA/NA meetings being offered and held in this unit. She has previous and or identifiable medical conditions that required to be treated and or monitor while a patient in this hospital. She did receive medication management for those medical issues and was monitored closely for any potential problems that may arise as a result of and or during detoxification treatment. Patient tolerated her detoxification treatment without any significant adverse effects and or reactions.  Barbara Key was admitted with complaints of Rt arm and hand pains, and difficulty lifting and using her Rt hand. She reported that she woke up at the [facility] ED after a brief stay and noticed that she has problems with  that very hand. She added that she did have an IV infiltration to this hand while at this hospital. To assure appropriate care, a neurology, orthopedic and primary care consults were provided for patient. Per recommendation of the consultants, patient is currently wearing a Rt. Hand brace as a diagnosis of Rt. Radial nerve palsy was given. Patient also received antibiotic therapy for any potential infection that may be associated with this Rt arm and hand problems. OT saw patient and  recommended some exercise regimen for her Rt. Hand prior to discharge.  Patient attended treatment team meeting this am and met with the team. Her symptoms, substance abuse issues, response to to treatment and discharge plans discussed. Patient endorsed that she is doing well and stable for discharge to pursue the next phase of her substance abuse treatment.  It was agreed upon between patient and the team that she will be discharged to the residential treatment facility [facility]. Patient will [facility] facility straight to the [facility] today. Upon discharge, patient adamantly denies suicidal, homicidal ideations, auditory, visual hallucinations, delusional thinking and or withdrawal symptoms.She was provided with 2 weeks worth samples of her discharge medications, including the remaining number of her antibiltic medication to complete at the center.  Patient left [facility] with all personal belongings in no apparent distress, Transportation per [facility]..     Consults:  neurology, orthopedic surgery and primary care consults.  Significant Diagnostic Studies:  labs: CMP, CBC with diff, TSH, Toxicology  Discharge Vitals:   Blood pressure 96/65, pulse 93, temperature 97.8 F (36.6 C), temperature source Oral, resp. rate 16, height 5\' 7"  (1.702 m), weight 74.844 kg (165 lb).  Mental Status Exam: See Mental Status Examination and Suicide Risk Assessment completed by Attending Physician prior to discharge.  Discharge destination:  Other:  [facility]  Is patient on multiple antipsychotic therapies at discharge:  No   Has Patient had three or more failed trials of antipsychotic monotherapy by history:  No  Recommended Plan for Multiple Antipsychotic Therapies: NA   Medication List  As of 04/22/2012 10:38 AM   STOP taking these medications         doxycycline 100 MG capsule      nicotine 21 mg/24hr patch         TAKE these medications      Indication      doxycycline 100 MG tablet   Commonly known as: VIBRA-TABS   Take 1 tablet (100 mg total) by mouth every 12 (twelve) hours: For infection       DULoxetine 20 MG capsule   Commonly known as: CYMBALTA   Take 1 capsule (20 mg total) by mouth 2 (two) times daily. For depression       gabapentin 300 MG capsule   Commonly known as: NEURONTIN   Take 1 capsule (300 mg total) by mouth 3 (three) times daily. For anxiety       levothyroxine 175 MCG tablet   Commonly known as: SYNTHROID, LEVOTHROID   Take 1 tablet (175 mcg total) by mouth daily before breakfast. For thyroid hormone replacement.       naltrexone 50 MG tablet   Commonly known as: DEPADE   Take 1 tablet (50 mg total) by mouth daily. For for alcohol cravings       QUEtiapine 25 MG tablet   Commonly known as: SEROQUEL   Take 1 tablet (25 mg total) by mouth 3 (three) times daily.  For anxiety/mood stablization       traZODone 50 MG tablet   Commonly known as: DESYREL   Take 1 tablet (50 mg total) by mouth at bedtime. For sleep            Follow-up Information    Follow up with [facility] on 04/22/2012. (Will be picked up at 12:00 pm)    Contact information:   [address] [place], [place] [address] [phone]         Follow-up recommendations:  Activity:  as tolerated Other:  Keep all scheduled follow-up appointments as recommended.   Comments:  Take all your medications as prescribed by your mental healthcare provider. Report any adverse effects and or reactions from your medicines to your outpatient provider promptly. Patient is instructed and cautioned to not engage in alcohol and or illegal drug use while on prescription medicines. In the event of worsening symptoms, patient is instructed to call the crisis hotline, 911 and or go to the nearest ED for appropriate evaluation and treatment of symptoms. Follow-up with your primary care provider for your other medical issues, concerns and or health care  needs.     SignedArmandina Stammer I 04/22/2012, 10:38 AM

## 2012-04-22 NOTE — Evaluation (Signed)
Occupational Therapy Evaluation Patient Details Name: Barbara Key MRN: [id number] DOB: 1971/06/20 Today's Date: 04/22/2012 Time: 4098-1191 OT Time Calculation (min): 34 min  OT Assessment / Plan / Recommendation Clinical Impression  This 41 year old female has a R nerve palsy which is currently being managed with a wrist cock up splint.  Pt plans to go to an inpatient tx center upon D/C later today.  If palsy does not return, she will follow up with MD for EMG and potentially OP OT for outrigger splint.  Provided HEP and educated pt on use and stretching precautions.      OT Assessment  All further OT needs can be met in the next venue of care    Follow Up Recommendations  Outpatient OT (if needed after residential tx program)    Barriers to Discharge      Equipment Recommendations  None recommended by OT    Recommendations for Other Services    Frequency       Precautions / Restrictions Precautions Required Braces or Orthoses: Other Brace/Splint Other Brace/Splint: wearing wrist cock up splint R (padded, metal stay) Restrictions Weight Bearing Restrictions: No   Pertinent Vitals/Pain No pain    ADL  Transfers/Ambulation Related to ADLs: independent with mobility ADL Comments: Pt is able to bathe and dress herself.  She doesn't have fasteners except for hoodie, which is hard to zip, but she manages with extra time.  She has difficulty putting hair in ponytail.  Suggested trying to one side so she can see hands in the mirror.    OT Diagnosis: Generalized weakness (radial nerve palsy)  OT Problem List: Decreased strength;Decreased range of motion;Impaired UE functional use OT Treatment Interventions:     OT Goals    Visit Information  Last OT Received On: 04/22/12 Assistance Needed: +1    Subjective Data  Subjective: I've been using it.  I just want it to come back Patient Stated Goal: Get RUE back to normal   Prior Functioning  Vision/Perception  Home  Living Additional Comments: going to tx program in [place] after [facility] Dominant Hand: Right      Cognition  Overall Cognitive Status: Appears within functional limits for tasks assessed/performed Behavior During Session: [facility] for tasks performed    Extremity/Trunk Assessment Right Upper Extremity Assessment RUE ROM/Strength/Tone: Deficits RUE ROM/Strength/Tone Deficits: R radial nerve palsy:  unable to extend wrist or hand.  All other movements WFLs. Pt is able to perform 2 and 3 point pinches, although she does report some numbness.  She is able to feel pressure.  Gave HEP of in hand manipulation, stretching fingers on table or rolling ball palm to fingertips and continuing to use daily, using RUE as dominant hand except for anything spillable or breakable.  Pt has not been feeding herself at all with RUE, but she has been incorporating hand in bil tasks.  Worked with cards for manipulation as well as turning a Public house manager.  Educated on properly stretching fingers and wrist into full extension as she was hyperextending MCPs before education.   Left Upper Extremity Assessment LUE ROM/Strength/Tone: Within functional levels   Mobility     Exercise    Balance    End of Session OT - End of Session Activity Tolerance: Patient tolerated treatment well Nurse Communication: Other (comment) (NP for status/HEP)  GO Functional Assessment Tool Used: clinical judgment Functional Limitation: Self care Self Care Current Status ([id number]): At least 1 percent but less than 20 percent impaired, limited or restricted  Self Care Goal Status [phone]): At least 1 percent but less than 20 percent impaired, limited or restricted Self Care Discharge Status [phone]): At least 1 percent but less than 20 percent impaired, limited or restricted   Willard Madrigal 04/22/2012, 12:03 PM Barbara Key, OTR/L [phone] 04/22/2012

## 2012-04-22 NOTE — Care Management Note (Addendum)
Interdisciplinary Treatment Plan Update (Adult)  Date: 04/22/2012   Time Reviewed: 9:59 AM   Progress in Treatment: Attending groups: Yes Participating in groups:  Yes Taking medication as prescribed: Yes Tolerating medication:  Yes Family/Significant other contact made:  Yes Patient understands diagnosis:  Yes Discussing patient identified problems/goals with staff:  Yes Medical problems stabilized or resolved:  Yes Denies suicidal/homicidal ideation: Yes Issues/concerns per patient self-inventory:  None identified Other: N/A  New problem(s) identified: None Identified   Additional comments: N/A  Estimated length of stay: Discharge Today  Discharge Plan: Discharge to [facility]  New goal(s): N/A  Review of initial/current patient goals per problem list:    1.  Goal(s): Address substance use  Met:  Yes  As evidenced by: completing detox protocol and refer to appropriate treatment  2.  Goal (s): Reduce depressive symptoms  Met:  Yes  As evidenced by: Reducing depression from a 10 to a 3 as reported by pt.       Attendees: Patient:  Barbara Key 04/22/2012 9:54 AM    Family:     Physician:  Lupe Carney, DO 04/22/2012 9:52 AM  Nursing:    Case Manager:  Barrie Folk, RN MS EdS 04/22/2012 9:52 AM  Counselor:  Ronda Fairly, LCSWA 04/22/2012 9:52 AM  Other: Izola Price RN 04/22/2012 9:53 AM   Other:  Nena Polio RN 04/22/2012 9:54 AM    Other:  Roswell Miners RN 04/22/2012 9:55 AM    Other:  Omelia Blackwater RN 04/22/2012 9:55 AM    Scribe for Treatment Team:   Barrie Folk 04/22/2012 9:52 AM

## 2012-04-22 NOTE — Progress Notes (Signed)
Case Management Discharge Note: Patient states that she is eager for continued treatment at [facility]. Representative from [facility] will pick her up at noon today. Her belongings are in her care which is parked in the parking lot at [facility]. No further case management needs voiced. Joice Lofts RN MS EdS 04/22/2012  12:12 PM

## 2012-04-22 NOTE — Progress Notes (Signed)
D:  Patient discharged to [facility] treatment facility.  Up and active in the milieu this morning.  Reviewed all discharge instructions, medications, and follow up care.  Two week supply of medications given to patient from the hospital pharmacy.  Affect brighter and patient states she feels more hopeful.  She admits that she still has cravings, but is looking forward to moving on.   A:  Patient was escorted off the unit ambulatory and out to the front lobby.  Staff from [facility] was there to pick her up.  All belongings retrieved from room and from locker.   R:  Pleasant and cooperative with staff today.  Verbalizes understanding of all discharge instructions.

## 2012-04-22 NOTE — [facility] Suicide Risk Assessment (Signed)
Suicide Risk Assessment  Discharge Assessment       Demographic factors:  Divorced or widowed;Caucasian;Unemployed  Current Mental Status Per Nursing Assessment: On Admission:  Suicidal ideation indicated by patient;Suicide plan At Discharge: Pt denied any SI/HI/thoughts of self harm or acute psychiatric issues in treatment team with clinical, nursing and medical team present.  Current Mental Status Per Physician: Patient seen and evaluated. Chart reviewed. Patient stated that her mood was "much better. Her affect was mood congruent and hopeful now that she is transitioning to [facility]. She denied any current thoughts of self injurious behavior, suicidal ideation or homicidal ideation.  Nevertheless, had SI prior to admission and attempted to OD on heroin. There were no auditory or visual hallucinations, paranoia, delusional thought processes, or mania noted at this time. Thought process was linear and goal directed. Speech was normal rate, tone and volume. Eye contact was fair. Judgment and insight are limited. Patient has been up and engaged on the unit. No SEs from her meds were reported.  Stable on current regimen.  Loss Factors: Financial problems / change in socioeconomic status  Historical Factors: Prior suicide attempts; s/p recent OD on heroin; multiple relapses  Risk Reduction Factors: Living with another person, especially a relative  Discharge Diagnoses: Polysubstance Dependence; GAD; Hypothyroidism; GERD; Asthma; Chronic Pain; Radial Nerve Palsy; r/o Sleep Apnea; SIMD; r/o PD NOS with Cluster B Traits  Past Medical History  Diagnosis Date  . Thyroid disease   . Polysubstance (excluding opioids) dependence     Cognitive Features That Contribute To Risk:  limited insight; impulsivity.  Suicide Risk: Pt viewed as a chronic increased risk of harm to self in light of her past hx and risk factors.  No acute safety concerns noted on the unit.  Pt contracting for safety and is stable  for discharge to [facility] for further residential Tx.  Plan Of Care/Follow-up recommendations: Pt seen and evaluated in treatment team. Chart reviewed.  Pt stable for and requesting discharge to [facility]. Pt contracting for safety and does not currently meet  involuntary commitment criteria for continued hospitalization against her will.  Mental health treatment, medication management and continued sobriety will mitigate against the increased risk of harm to self and/or others.  Discussed the importance of recovery further with pt, as well as, tools to move forward in a healthy & safe manner.  Pt agreeable with the plan.  Discussed with the team. Pt being discharge directly to [facility], they are picking her up. Recommend follow up with AA/NA.  Diet: Regular.  Activity: As tolerated. OT consult completed today before discharge, see note.    Lupe Carney 04/22/2012, 11:41 AM

## 2012-04-23 NOTE — Progress Notes (Signed)
Patient Discharge Instructions:  After Visit Summary (AVS):   Faxed to:  04/22/2012 Psychiatric Admission Assessment Note:   Faxed to:  04/22/2012 Suicide Risk Assessment - Discharge Assessment:   Faxed to:  04/22/2012 Faxed/Sent to the Next Level Care provider:  04/22/2012  Faxed to the [facility] @ [phone]  Wandra Scot, 04/23/2012, 2:45 PM

## 2012-09-02 ENCOUNTER — Encounter (HOSPITAL_COMMUNITY): Payer: Self-pay | Admitting: *Deleted

## 2012-09-02 ENCOUNTER — Inpatient Hospital Stay (HOSPITAL_COMMUNITY)
Admission: EM | Admit: 2012-09-02 | Discharge: 2012-09-13 | DRG: 442 | Disposition: A | Discharge status: Home / Self Care | Payer: Managed Care, Other (non HMO) | Attending: Internal Medicine | Admitting: Internal Medicine

## 2012-09-02 DIAGNOSIS — G5630 Lesion of radial nerve, unspecified upper limb: Secondary | ICD-10-CM

## 2012-09-02 DIAGNOSIS — F1920 Other psychoactive substance dependence, uncomplicated: Secondary | ICD-10-CM | POA: Diagnosis present

## 2012-09-02 DIAGNOSIS — F1910 Other psychoactive substance abuse, uncomplicated: Secondary | ICD-10-CM

## 2012-09-02 DIAGNOSIS — Z9119 Patient's noncompliance with other medical treatment and regimen: Secondary | ICD-10-CM

## 2012-09-02 DIAGNOSIS — F1120 Opioid dependence, uncomplicated: Secondary | ICD-10-CM | POA: Diagnosis present

## 2012-09-02 DIAGNOSIS — K5900 Constipation, unspecified: Secondary | ICD-10-CM | POA: Diagnosis present

## 2012-09-02 DIAGNOSIS — F17200 Nicotine dependence, unspecified, uncomplicated: Secondary | ICD-10-CM | POA: Diagnosis present

## 2012-09-02 DIAGNOSIS — R45851 Suicidal ideations: Secondary | ICD-10-CM

## 2012-09-02 DIAGNOSIS — F1994 Other psychoactive substance use, unspecified with psychoactive substance-induced mood disorder: Secondary | ICD-10-CM

## 2012-09-02 DIAGNOSIS — F1490 Cocaine use, unspecified, uncomplicated: Secondary | ICD-10-CM

## 2012-09-02 DIAGNOSIS — F1010 Alcohol abuse, uncomplicated: Secondary | ICD-10-CM | POA: Diagnosis present

## 2012-09-02 DIAGNOSIS — R079 Chest pain, unspecified: Secondary | ICD-10-CM

## 2012-09-02 DIAGNOSIS — B179 Acute viral hepatitis, unspecified: Secondary | ICD-10-CM

## 2012-09-02 DIAGNOSIS — B1710 Acute hepatitis C without hepatic coma: Principal | ICD-10-CM | POA: Diagnosis present

## 2012-09-02 DIAGNOSIS — Z91199 Patient's noncompliance with other medical treatment and regimen due to unspecified reason: Secondary | ICD-10-CM

## 2012-09-02 DIAGNOSIS — M545 Low back pain, unspecified: Secondary | ICD-10-CM | POA: Diagnosis present

## 2012-09-02 DIAGNOSIS — M79671 Pain in right foot: Secondary | ICD-10-CM

## 2012-09-02 DIAGNOSIS — N390 Urinary tract infection, site not specified: Secondary | ICD-10-CM | POA: Diagnosis present

## 2012-09-02 DIAGNOSIS — E039 Hypothyroidism, unspecified: Secondary | ICD-10-CM

## 2012-09-02 DIAGNOSIS — K759 Inflammatory liver disease, unspecified: Secondary | ICD-10-CM

## 2012-09-02 HISTORY — DX: Lesion of radial nerve, unspecified upper limb: G56.30

## 2012-09-02 HISTORY — DX: Other psychoactive substance use, unspecified with psychoactive substance-induced mood disorder: F19.94

## 2012-09-02 HISTORY — DX: Mental disorder, not otherwise specified: F99

## 2012-09-02 HISTORY — DX: Hypothyroidism, unspecified: E03.9

## 2012-09-02 LAB — TROPONIN I
Troponin I: <0.3 ng/mL
Troponin I: <0.3 ng/mL (ref <0.30)

## 2012-09-02 LAB — PREGNANCY, URINE: Preg Test, Ur: NEGATIVE

## 2012-09-02 LAB — HEPATIC FUNCTION PANEL
ALT: 1372 U/L — ABNORMAL HIGH (ref 0–35)
AST: 1093 U/L — ABNORMAL HIGH (ref 0–37)
Albumin: 4.1 g/dL (ref 3.5–5.2)
Alkaline Phosphatase: 164 U/L — ABNORMAL HIGH (ref 39–117)
Bilirubin, Direct: 0.2 mg/dL (ref 0.0–0.3)
Indirect Bilirubin: 0.4 mg/dL (ref 0.3–0.9)
Total Bilirubin: 0.6 mg/dL (ref 0.3–1.2)
Total Protein: 7.7 g/dL (ref 6.0–8.3)

## 2012-09-02 LAB — BASIC METABOLIC PANEL WITH GFR
BUN: 10 mg/dL (ref 6–23)
CO2: 21 meq/L (ref 19–32)
Calcium: 8.7 mg/dL (ref 8.4–10.5)
Chloride: 105 meq/L (ref 96–112)
Creatinine, Ser: 1.01 mg/dL (ref 0.50–1.10)
GFR calc Af Amer: 80 mL/min — ABNORMAL LOW
GFR calc non Af Amer: 69 mL/min — ABNORMAL LOW
Glucose, Bld: 90 mg/dL (ref 70–99)
Potassium: 3.2 meq/L — ABNORMAL LOW (ref 3.5–5.1)
Sodium: 141 meq/L (ref 135–145)

## 2012-09-02 LAB — CBC WITH DIFFERENTIAL/PLATELET
Basophils Absolute: 0.1 K/uL (ref 0.0–0.1)
Basophils Relative: 2 % — ABNORMAL HIGH (ref 0–1)
Eosinophils Absolute: 0.2 K/uL (ref 0.0–0.7)
Eosinophils Relative: 3 % (ref 0–5)
HCT: 42.2 % (ref 36.0–46.0)
Hemoglobin: 14.1 g/dL (ref 12.0–15.0)
Lymphocytes Relative: 30 % (ref 12–46)
Lymphs Abs: 1.9 K/uL (ref 0.7–4.0)
MCH: 31.4 pg (ref 26.0–34.0)
MCHC: 33.4 g/dL (ref 30.0–36.0)
MCV: 94 fL (ref 78.0–100.0)
Monocytes Absolute: 0.4 K/uL (ref 0.1–1.0)
Monocytes Relative: 6 % (ref 3–12)
Neutro Abs: 3.9 K/uL (ref 1.7–7.7)
Neutrophils Relative %: 60 % (ref 43–77)
Platelets: 209 K/uL (ref 150–400)
RBC: 4.49 MIL/uL (ref 3.87–5.11)
RDW: 16.4 % — ABNORMAL HIGH (ref 11.5–15.5)
WBC: 6.4 K/uL (ref 4.0–10.5)

## 2012-09-02 LAB — RAPID URINE DRUG SCREEN, HOSP PERFORMED
Amphetamines: NOT DETECTED
Barbiturates: NOT DETECTED
Benzodiazepines: NOT DETECTED
Cocaine: POSITIVE — AB
Opiates: NOT DETECTED
Tetrahydrocannabinol: POSITIVE — AB

## 2012-09-02 LAB — ETHANOL: Alcohol, Ethyl (B): <11 mg/dL (ref 0–11)

## 2012-09-02 MED ORDER — THIAMINE HCL 100 MG/ML IJ SOLN: 100.0000 mg | Freq: Every day | INTRAMUSCULAR | Status: DC

## 2012-09-02 MED ORDER — LORAZEPAM 1 MG PO TABS
0.0000 mg | ORAL_TABLET | Freq: Four times a day (QID) | ORAL | Status: AC
  Administered 2012-09-02: 1 mg via ORAL
  Administered 2012-09-03: 2 mg via ORAL
  Administered 2012-09-04: 1 mg via ORAL
  Filled 2012-09-02: qty 1

## 2012-09-02 MED ORDER — POTASSIUM CHLORIDE CRYS ER 20 MEQ PO TBCR
EXTENDED_RELEASE_TABLET | ORAL | Status: AC
  Filled 2012-09-02: qty 2

## 2012-09-02 MED ORDER — SODIUM CHLORIDE 0.9 % IV BOLUS (SEPSIS)
1000.0000 mL | Freq: Once | INTRAVENOUS | Status: AC
  Administered 2012-09-02: 1000 mL via INTRAVENOUS

## 2012-09-02 MED ORDER — VITAMIN B-1 100 MG PO TABS
100.0000 mg | ORAL_TABLET | Freq: Every day | ORAL | Status: DC
  Administered 2012-09-02 – 2012-09-05 (×4): 100 mg via ORAL
  Filled 2012-09-02 (×4): qty 1

## 2012-09-02 MED ORDER — ADULT MULTIVITAMIN W/MINERALS CH
1.0000 | ORAL_TABLET | Freq: Every day | ORAL | Status: DC
  Administered 2012-09-02 – 2012-09-05 (×4): 1 via ORAL
  Filled 2012-09-02 (×4): qty 1

## 2012-09-02 MED ORDER — LORAZEPAM 1 MG PO TABS
1.0000 mg | ORAL_TABLET | Freq: Four times a day (QID) | ORAL | Status: DC | PRN
  Administered 2012-09-03 – 2012-09-05 (×6): 1 mg via ORAL
  Filled 2012-09-02 (×7): qty 1
  Filled 2012-09-02: qty 2
  Filled 2012-09-02: qty 1

## 2012-09-02 MED ORDER — FOLIC ACID 1 MG PO TABS
1.0000 mg | ORAL_TABLET | Freq: Every day | ORAL | Status: DC
  Administered 2012-09-02 – 2012-09-05 (×4): 1 mg via ORAL
  Filled 2012-09-02 (×4): qty 1

## 2012-09-02 MED ORDER — KETOROLAC TROMETHAMINE 30 MG/ML IJ SOLN
30.0000 mg | Freq: Once | INTRAMUSCULAR | Status: AC
  Administered 2012-09-02: 30 mg via INTRAVENOUS
  Filled 2012-09-02: qty 1

## 2012-09-02 MED ORDER — LORAZEPAM 2 MG/ML IJ SOLN: 1.0000 mg | Freq: Four times a day (QID) | INTRAMUSCULAR | Status: DC | PRN

## 2012-09-02 MED ORDER — LORAZEPAM 1 MG PO TABS
0.0000 mg | ORAL_TABLET | Freq: Two times a day (BID) | ORAL | Status: DC
Start: 2012-09-04 — End: 2012-09-05
  Administered 2012-09-04 – 2012-09-05 (×2): 1 mg via ORAL
  Filled 2012-09-02: qty 1

## 2012-09-02 MED ORDER — LORAZEPAM 2 MG/ML IJ SOLN
1.0000 mg | Freq: Once | INTRAMUSCULAR | Status: AC
  Administered 2012-09-02: 1 mg via INTRAVENOUS
  Filled 2012-09-02: qty 1

## 2012-09-02 MED ORDER — POTASSIUM CHLORIDE CRYS ER 20 MEQ PO TBCR
40.0000 meq | EXTENDED_RELEASE_TABLET | Freq: Once | ORAL | Status: AC
  Administered 2012-09-02: 40 meq via ORAL

## 2012-09-02 NOTE — ED Notes (Signed)
Pt has been wanded by security. 

## 2012-09-02 NOTE — ED Notes (Addendum)
Per EMS:  Pt experiencing chest pain after smoking crack cocaine, pt st's she may have done a little too much tonight (pt uses crack on a daily basis).  Pt not complaining of any SOB, no n/v, chest pain is cramping in nature and radiates to L arm.  No dizziness, no lightheadedness.  20g in L AC.  Pt in NSR on monitor.  EMS gave 1 NTG and 324 of ASA.

## 2012-09-02 NOTE — ED Provider Notes (Addendum)
History     CSN: [id number]  Arrival date & time 09/02/12  0434   First MD Initiated Contact with Patient 09/02/12 [phone]      Chief complaint: Chest pain  (Consider location/radiation/quality/duration/timing/severity/associated sxs/prior treatment) HPI Comments: 41 year old female with a history of frequent substance abuse including intravenous heroin, crack cocaine, marijuana, prescription drugs. She presents with a complaint of chest pain after using an excessive amount of crack cocaine this evening. She started using drugs at 9:00 this evening, she took her last amount of drugs at 2:30 and has been having pain since that time. It is described as left-sided, achy, radiation to the left arm, not associated with shortness of breath. She does feel very depressed and states that she has been in detox multiple times in the past including 2 months ago but she left early. She lives with her father and her son, she states that she wants to get clean for her son. She denies any history of heart disease, hypertension, diabetes. Nothing seems to make this pain better or worse.  The history is provided by the patient.    Past Medical History  Diagnosis Date  . Thyroid disease   . Polysubstance (excluding opioids) dependence     Past Surgical History  Procedure Date  . Foot surgery 3/13; 06/12    to repair a deformity: plate and screws to Rt then Lt foot  . Tubal ligation   . Endometrial ablation     Family History  Problem Relation Age of Onset  . Cancer Mother   . Alcohol abuse Father     History  Substance Use Topics  . Smoking status: Current Every Day Smoker -- 1.0 packs/day for 26 years    Types: Cigarettes  . Smokeless tobacco: Never Used  . Alcohol Use: Yes     Comment: one pint Vodka per day    OB History    Grav Para Term Preterm Abortions TAB SAB Ect Mult Living                  Review of Systems  All other systems reviewed and are negative.    Allergies   Review of patient's allergies indicates no known allergies.  Home Medications   No current outpatient prescriptions on file.  BP 118/83  Pulse 76  Temp 97.9 F (36.6 C) (Oral)  Resp 14  SpO2 99%  Physical Exam  Nursing note and vitals reviewed. Constitutional: She appears well-developed and well-nourished.       Tearful  HENT:  Head: Normocephalic and atraumatic.  Mouth/Throat: No oropharyngeal exudate.       Mucous membranes are mildly dehydrated  Eyes: Conjunctivae normal and EOM are normal. Pupils are equal, round, and reactive to light. Right eye exhibits no discharge. Left eye exhibits no discharge. No scleral icterus.  Neck: Normal range of motion. Neck supple. No JVD present. No thyromegaly present.  Cardiovascular: Normal rate, regular rhythm, normal heart sounds and intact distal pulses.  Exam reveals no gallop and no friction rub.   No murmur heard. Pulmonary/Chest: Effort normal and breath sounds normal. No respiratory distress. She has no wheezes. She has no rales. She exhibits no tenderness.  Abdominal: Soft. Bowel sounds are normal. She exhibits no distension and no mass. There is no tenderness.  Musculoskeletal: Normal range of motion. She exhibits no edema and no tenderness.  Lymphadenopathy:    She has no cervical adenopathy.  Neurological: She is alert. Coordination normal.  Skin: Skin is warm  and dry. No rash noted. No erythema.  Psychiatric:       Tearful, depressed    ED Course  Procedures (including critical care time)  Labs Reviewed  CBC WITH DIFFERENTIAL - Abnormal; Notable for the following:    RDW 16.4 (*)     Basophils Relative 2 (*)     All other components within normal limits  BASIC METABOLIC PANEL - Abnormal; Notable for the following:    Potassium 3.2 (*)     GFR calc non Af Amer 69 (*)     GFR calc Af Amer 80 (*)     All other components within normal limits  TROPONIN I  TROPONIN I  ETHANOL  HEPATIC FUNCTION PANEL  URINE RAPID  DRUG SCREEN (HOSP PERFORMED)  PREGNANCY, URINE   No results found.   1. Crack cocaine use   2. Polysubstance abuse   3. Chest pain       MDM  The patient appears to be perfusing, vital signs show normal oxygenation, blood pressure and pulse. Her EKG shows normal sinus rhythm with a prolonged QT and poor R-wave progression but no signs of acute ischemia. Compared to a prior EKG she has QT prolongation.   ED ECG REPORT  I personally interpreted this EKG   Date: 09/02/2012   Rate: 80  Rhythm: normal sinus rhythm  QRS Axis: normal  Intervals: QT prolonged  ST/T Wave abnormalities: normal  Conduction Disutrbances:none  Narrative Interpretation:   Old EKG Reviewed: Prolonged QT compared to prior   The patient has become chest pain-free after Ativan and Toradol. She has been given IV fluids and is currently chest pain-free, has a normal troponin as well as normal blood counts. She does have mild hypokalemia which has been replaced with oral potassium. I will pursue placement for her with her substance abuse including heroin, cocaine, and for her depression.  Clearance labs essentially normal, pending  Change of shift - care signed out to oncoming ED Physician  Vida Roller, MD 09/02/12 [phone]  Vida Roller, MD 09/02/12 [phone]

## 2012-09-02 NOTE — Progress Notes (Signed)
7:50 AM Pt awake and alert on rounds.  She is seeking detox from opiates, cocaine.  UDS was positive for cocaine, opiates.  ETOH, LFT's, TNI still pending.  Awaiting ACT evaluation.

## 2012-09-02 NOTE — ED Notes (Signed)
Care transferred, report received Everardo Pacific, Charity fundraiser.

## 2012-09-02 NOTE — ED Notes (Signed)
ACT Team at bedside.  

## 2012-09-02 NOTE — ED Notes (Signed)
Belongings locked in [place] # 2. Per shift report, patient wanded by security and belongings as well.
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
## 2012-09-02 NOTE — ED Notes (Signed)
Pt at desk with incoming phone call.

## 2012-09-02 NOTE — ED Notes (Addendum)
ACT Team reports patient with feelings of sadness and hopelessness. Pt was unable to contract for safety. Charge nurse- Kristi made aware. Sitter- Joni Reining at bedside.

## 2012-09-02 NOTE — BH Assessment (Signed)
[facility] Assessment Progress Note      Consulted with Aggie N NP re referral. She declined pt for admission to [facility] because she does not meet criteria as she is positive on her urine drug screen for marijuana and cocaine which there is no medical detox required.
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
## 2012-09-03 MED ORDER — ONDANSETRON HCL 4 MG/2ML IJ SOLN: 4.0000 mg | Freq: Four times a day (QID) | INTRAMUSCULAR | Status: DC | PRN

## 2012-09-03 MED ORDER — ONDANSETRON 4 MG PO TBDP
4.0000 mg | ORAL_TABLET | ORAL | Status: DC | PRN
  Administered 2012-09-03 – 2012-09-04 (×2): 4 mg via ORAL
  Filled 2012-09-03 (×3): qty 1

## 2012-09-03 MED ORDER — NICOTINE 21 MG/24HR TD PT24
21.0000 mg | MEDICATED_PATCH | Freq: Every day | TRANSDERMAL | Status: DC
  Administered 2012-09-03 – 2012-09-05 (×3): 21 mg via TRANSDERMAL
  Filled 2012-09-03 (×3): qty 1

## 2012-09-03 MED ORDER — IBUPROFEN 800 MG PO TABS
800.0000 mg | ORAL_TABLET | Freq: Three times a day (TID) | ORAL | Status: DC | PRN
  Administered 2012-09-03 – 2012-09-05 (×4): 800 mg via ORAL
  Filled 2012-09-03 (×4): qty 4
  Filled 2012-09-03: qty 1

## 2012-09-03 NOTE — BH Assessment (Signed)
[facility] Assessment Progress Note   This clinician called Jane at [facility] at 00:30 and she said that they had some beds.  Referral was sent to her for review.

## 2012-09-03 NOTE — ED Notes (Signed)
The pt is awake and she reports that her legs are cramping more than earlier and the cramps never really went away.  Her arms  Are cramping now.  Ativan given.  Sitter at the bedside

## 2012-09-03 NOTE — ED Notes (Signed)
The pt ate minimal food at dinner

## 2012-09-03 NOTE — ED Notes (Signed)
advil given for pain in her rt ankle.  She had to be awakened for med

## 2012-09-03 NOTE — ED Notes (Signed)
Pt asleep; sitter at bedside.  

## 2012-09-03 NOTE — ED Notes (Signed)
Sitter at the bedside.  She still has pain in her rt foot sleeping intermittently

## 2012-09-03 NOTE — ED Notes (Signed)
Pt up to the br requesting something for nausea.  edp notified

## 2012-09-03 NOTE — ED Notes (Signed)
Med given for nausea .  Pt sleeping as i came in to give med.  Will give pain med  In 5 minutes

## 2012-09-03 NOTE — ED Notes (Signed)
The pt reports that her  Leg spasms are better but her rt foot is painful from surgery .  The wound has healed and the scar looks well healed.  She is due advil at 18.  Will inform pt

## 2012-09-03 NOTE — ED Notes (Signed)
The pt is asleep with sitter at the bedside

## 2012-09-03 NOTE — ED Notes (Signed)
First meeting with patient. Patient sitting up in bed eating breakfast with NAD. Patient denies any needs at this time and sitter is at bedside.

## 2012-09-04 ENCOUNTER — Encounter (HOSPITAL_COMMUNITY): Payer: Self-pay

## 2012-09-04 LAB — HEPATIC FUNCTION PANEL
Albumin: 3.8 g/dL (ref 3.5–5.2)
Alkaline Phosphatase: 238 U/L — ABNORMAL HIGH (ref 39–117)
Bilirubin, Direct: 0.4 mg/dL — ABNORMAL HIGH (ref 0.0–0.3)
Total Bilirubin: 1 mg/dL (ref 0.3–1.2)

## 2012-09-04 MED ORDER — KETOROLAC TROMETHAMINE 10 MG PO TABS
10.0000 mg | ORAL_TABLET | Freq: Once | ORAL | Status: AC
  Administered 2012-09-04: 10 mg via ORAL
  Filled 2012-09-04: qty 1

## 2012-09-04 NOTE — ED Notes (Signed)
Sitter at the bedside.  Pt remains asleep

## 2012-09-04 NOTE — ED Notes (Signed)
Sitter at the bedside.  Pt still asleep.  No more ativan given at this time.  Last ativan given at [facility]

## 2012-09-04 NOTE — Progress Notes (Signed)
12:19 PM Seen on rounds, where she spoke of continued anxiety.  She has waited a long time for placement; asked ACT to facilitate her transfer to a psychiatric facility.

## 2012-09-04 NOTE — ED Notes (Signed)
Pt  A.O.x 4. Laying down in bed resting. Respirations even and regular. NAD. Denies pain. reports anxiety 7/10.
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
## 2012-09-04 NOTE — ED Notes (Signed)
Regular lunch order placed.  

## 2012-09-04 NOTE — [facility] Counselor (Signed)
Staff contacted by Janice Coffin.  Pt. Declined by Aggie due to elevated liver enzymes, pancreatic levels, increased potassium.  Staff passed information on to Psyc. Nurse, Lowanda Foster and she discussed with Dr. Ignacia Palma.  Labs will be requested and ER Physician will decide course of treatment.  Pt. Can be reviewed at [facility] for admission upon satisfactory medical clearance as reviewed by [facility] Psychiatrist/Nurse.

## 2012-09-05 ENCOUNTER — Inpatient Hospital Stay (HOSPITAL_COMMUNITY): Payer: Self-pay

## 2012-09-05 DIAGNOSIS — F1994 Other psychoactive substance use, unspecified with psychoactive substance-induced mood disorder: Secondary | ICD-10-CM

## 2012-09-05 DIAGNOSIS — K7200 Acute and subacute hepatic failure without coma: Secondary | ICD-10-CM

## 2012-09-05 DIAGNOSIS — B179 Acute viral hepatitis, unspecified: Secondary | ICD-10-CM | POA: Diagnosis present

## 2012-09-05 DIAGNOSIS — F1120 Opioid dependence, uncomplicated: Secondary | ICD-10-CM

## 2012-09-05 DIAGNOSIS — E039 Hypothyroidism, unspecified: Secondary | ICD-10-CM

## 2012-09-05 LAB — COMPREHENSIVE METABOLIC PANEL
Albumin: 4.2 g/dL (ref 3.5–5.2)
BUN: 17 mg/dL (ref 6–23)
Calcium: 9.9 mg/dL (ref 8.4–10.5)
GFR calc Af Amer: 58 mL/min — ABNORMAL LOW (ref >90)
Glucose, Bld: 88 mg/dL (ref 70–99)
Potassium: 4.3 mEq/L (ref 3.5–5.1)
Sodium: 137 mEq/L (ref 135–145)
Total Protein: 7.7 g/dL (ref 6.0–8.3)

## 2012-09-05 LAB — CBC
HCT: 44.6 % (ref 36.0–46.0)
Hemoglobin: 15.1 g/dL — ABNORMAL HIGH (ref 12.0–15.0)
MCH: 31.8 pg (ref 26.0–34.0)
MCHC: 33.9 g/dL (ref 30.0–36.0)
MCV: 93.9 fL (ref 78.0–100.0)
RBC: 4.75 MIL/uL (ref 3.87–5.11)

## 2012-09-05 LAB — PROTIME-INR: Prothrombin Time: 13 seconds (ref 11.6–15.2)

## 2012-09-05 MED ORDER — TRAZODONE HCL 50 MG PO TABS
50.0000 mg | ORAL_TABLET | Freq: Every day | ORAL | Status: DC
  Administered 2012-09-06 – 2012-09-11 (×6): 50 mg via ORAL
  Filled 2012-09-05 (×9): qty 1

## 2012-09-05 MED ORDER — ADULT MULTIVITAMIN W/MINERALS CH
1.0000 | ORAL_TABLET | Freq: Every day | ORAL | Status: DC
  Administered 2012-09-06 – 2012-09-13 (×8): 1 via ORAL
  Filled 2012-09-05 (×8): qty 1

## 2012-09-05 MED ORDER — THIAMINE HCL 100 MG/ML IJ SOLN: 100.0000 mg | Freq: Every day | INTRAMUSCULAR | Status: DC

## 2012-09-05 MED ORDER — ONDANSETRON HCL 4 MG PO TABS
4.0000 mg | ORAL_TABLET | Freq: Four times a day (QID) | ORAL | Status: DC | PRN
  Administered 2012-09-10: 4 mg via ORAL
  Filled 2012-09-05: qty 1

## 2012-09-05 MED ORDER — LORAZEPAM 1 MG PO TABS
0.0000 mg | ORAL_TABLET | Freq: Four times a day (QID) | ORAL | Status: DC
  Administered 2012-09-05 – 2012-09-06 (×3): 1 mg via ORAL
  Administered 2012-09-07: 2 mg via ORAL
  Filled 2012-09-05: qty 1
  Filled 2012-09-05: qty 2
  Filled 2012-09-05: qty 1

## 2012-09-05 MED ORDER — ALBUTEROL SULFATE (5 MG/ML) 0.5% IN NEBU: 2.5000 mg | INHALATION_SOLUTION | RESPIRATORY_TRACT | Status: DC | PRN

## 2012-09-05 MED ORDER — LORAZEPAM 1 MG PO TABS
0.0000 mg | ORAL_TABLET | Freq: Two times a day (BID) | ORAL | Status: DC
  Filled 2012-09-05: qty 1

## 2012-09-05 MED ORDER — LORAZEPAM 2 MG/ML IJ SOLN
1.0000 mg | Freq: Four times a day (QID) | INTRAMUSCULAR | Status: DC | PRN
  Administered 2012-09-06: 1 mg via INTRAVENOUS
  Filled 2012-09-05: qty 1

## 2012-09-05 MED ORDER — CLONIDINE HCL 0.1 MG PO TABS
0.1000 mg | ORAL_TABLET | Freq: Three times a day (TID) | ORAL | Status: DC
  Administered 2012-09-05 – 2012-09-13 (×19): 0.1 mg via ORAL
  Filled 2012-09-05 (×26): qty 1

## 2012-09-05 MED ORDER — LORAZEPAM 1 MG PO TABS
1.0000 mg | ORAL_TABLET | Freq: Four times a day (QID) | ORAL | Status: DC | PRN
  Administered 2012-09-05 – 2012-09-07 (×2): 1 mg via ORAL
  Filled 2012-09-05 (×4): qty 1

## 2012-09-05 MED ORDER — SODIUM CHLORIDE 0.9 % IV SOLN
INTRAVENOUS | Status: DC
  Administered 2012-09-05 – 2012-09-06 (×2): via INTRAVENOUS

## 2012-09-05 MED ORDER — NICOTINE 14 MG/24HR TD PT24
14.0000 mg | MEDICATED_PATCH | Freq: Every day | TRANSDERMAL | Status: DC
  Administered 2012-09-06 – 2012-09-08 (×3): 14 mg via TRANSDERMAL
  Filled 2012-09-05 (×3): qty 1

## 2012-09-05 MED ORDER — ONDANSETRON HCL 4 MG/2ML IJ SOLN: 4.0000 mg | Freq: Four times a day (QID) | INTRAMUSCULAR | Status: DC | PRN

## 2012-09-05 MED ORDER — VITAMIN B-1 100 MG PO TABS
100.0000 mg | ORAL_TABLET | Freq: Every day | ORAL | Status: DC
  Administered 2012-09-06 – 2012-09-13 (×8): 100 mg via ORAL
  Filled 2012-09-05 (×8): qty 1

## 2012-09-05 MED ORDER — FOLIC ACID 1 MG PO TABS
1.0000 mg | ORAL_TABLET | Freq: Every day | ORAL | Status: DC
  Administered 2012-09-06 – 2012-09-13 (×8): 1 mg via ORAL
  Filled 2012-09-05 (×8): qty 1

## 2012-09-05 MED ORDER — ENOXAPARIN SODIUM 30 MG/0.3ML ~~LOC~~ SOLN
30.0000 mg | SUBCUTANEOUS | Status: DC
  Administered 2012-09-06: 30 mg via SUBCUTANEOUS
  Filled 2012-09-05 (×3): qty 0.3

## 2012-09-05 MED ORDER — IBUPROFEN 600 MG PO TABS
600.0000 mg | ORAL_TABLET | Freq: Four times a day (QID) | ORAL | Status: DC | PRN
  Administered 2012-09-05 – 2012-09-06 (×2): 600 mg via ORAL
  Filled 2012-09-05: qty 1

## 2012-09-05 MED ORDER — KETOROLAC TROMETHAMINE 10 MG PO TABS
10.0000 mg | ORAL_TABLET | Freq: Once | ORAL | Status: AC
  Administered 2012-09-05: 10 mg via ORAL
  Filled 2012-09-05 (×2): qty 1

## 2012-09-05 NOTE — H&P (Addendum)
PATIENT DETAILS Name: Barbara Key Age: 41 y.o. Sex: female Date of Birth: Oct 25, 1970 Admit Date: 09/02/2012 ZOX:[id number],UJWJX A, MD   CHIEF COMPLAINT:  Asked by ED M.D.-to evaluate for acute hepatitis Patient initially presented to the ED on 12/5 for back pain/chest pain, she is an active IV heroine and crack cocaine user and expressed suicidal thinking.  HPI: Patient is a 41 year old Caucasian female with a past medical history of hypothyroidism-noncompliant to Synthroid for the past 5-6 months, 41 history of polysubstance abuse, drug induced mood disorder who presented to the ED with chest pain. She is an active IVDA with heroin and crack cocaine, and during her evaluation in the ED expressed suicidal ideations. She was observed in the ED andthought to need inpatient detoxification program. Routine labs were drawn and it showed significant transaminitis, these were followed and they started worsening and I was subsequently asked to admit this patient for further evaluation and treatment. During my evaluation , patient appears very tearful and complains of generalized aches and pains. She denies any fever. Her last use of illicit drugs was a day or so prior to presentation to the ED . Apart from doing IV heroine and crack cocaine, she also has a fairly significant amount of almost daily alcohol intake which she is very vague about, describes it around one pint of liquor almost every day or so. She also claims to take Percocet and Vicodin intermittently, upon repeatedly asking she claims that her last use of these medications were then 2 weeks ago. Patient claims that she has 2 kids, one of them lives with the father and the other lives with her ex-boyfriend. She was living with a drug dealer who now is in prison. She has been sexually active with numerous persons. During my evaluation she denied any abdominal pain. She does have occasional nausea and occasional vomiting but none today. She denies  any chest pain or shortness of breath. She seemed very tearful but otherwise comfortable he did   ALLERGIES:  No Known Allergies  PAST MEDICAL HISTORY: Past Medical History  Diagnosis Date  . Thyroid disease   . Polysubstance (excluding opioids) dependence     PAST SURGICAL HISTORY: Past Surgical History  Procedure Date  . Foot surgery 3/13; 06/12    to repair a deformity: plate and screws to Rt then Lt foot  . Tubal ligation   . Endometrial ablation     MEDICATIONS AT HOME: Prior to Admission medications   Not on File    FAMILY HISTORY: Family History  Problem Relation Age of Onset  . Cancer Mother   . Alcohol abuse Father     SOCIAL HISTORY:  reports that she has been smoking Cigarettes.  She has a 26 pack-year smoking history. She has never used smokeless tobacco. She reports that she drinks alcohol. She reports that she uses illicit drugs ("Crack" cocaine, Heroin, Cocaine, and Marijuana).  REVIEW OF SYSTEMS:  Constitutional:   No  weight loss, night sweats,  Fevers, chills, fatigue.  HEENT:    No headaches, Difficulty swallowing,Tooth/dental problems,Sore throat,  No sneezing, itching, ear ache, nasal congestion, post nasal drip,   Cardio-vascular: No chest pain,  Orthopnea, PND, swelling in lower extremities, anasarca,dizziness, palpitations  GI:  No heartburn, indigestion, abdominal pain,  diarrhea, change in bowel habits, loss of appetite  Resp: No shortness of breath with exertion or at rest.  No excess mucus, no productive cough, No non-productive cough,  No coughing up of blood.No change in color of  mucus.No wheezing.No chest wall deformity  Skin:  no rash or lesions.  GU:  no dysuria, change in color of urine, no urgency or frequency.  No flank pain.  Musculoskeletal: No joint pain or swelling.  No decreased range of motion.  No back pain.  Psych: No change in mood or affect. No depression or anxiety.  No memory loss.   PHYSICAL  EXAM: Blood pressure 106/73, pulse 90, temperature 97.1 F (36.2 C), temperature source Oral, resp. rate 16, SpO2 97.00%.  General appearance :Awake, alert very tearful, not in any distress. Speech Clear. Not toxic Looking HEENT: Atraumatic and Normocephalic, pupils equally reactive to light and accomodation Neck: supple, no JVD. No cervical lymphadenopathy.  Chest:Good air entry bilaterally, no added sounds  CVS: S1 S2 regular.  Abdomen: Bowel sounds present, Non tender and not distended with no gaurding, rigidity or rebound. Extremities: B/L Lower Ext shows no edema, both legs are warm to touch. Neurology: Awake alert, and oriented X 3, CN II-XII intact, Non focal Skin:No Rash Wounds:N/A  LABS ON ADMISSION:   Basename 09/05/12 1222 09/04/12 1413  NA 137 --  K 4.3 3.8  CL 99 --  CO2 27 --  GLUCOSE 88 --  BUN 17 --  CREATININE 1.32* --  CALCIUM 9.9 --  MG -- --  PHOS -- --    Basename 09/05/12 1222 09/04/12 1413  AST 1367* 1215*  ALT 1713* 1525*  ALKPHOS 266* 238*  BILITOT 0.9 1.0  PROT 7.7 7.4  ALBUMIN 4.2 3.8   No results found for this basename: LIPASE:2,AMYLASE:2 in the last 72 hours  Basename 09/05/12 1437  WBC 6.7  NEUTROABS --  HGB 15.1*  HCT 44.6  MCV 93.9  PLT 216   No results found for this basename: CKTOTAL:3,CKMB:3,CKMBINDEX:3,TROPONINI:3 in the last 72 hours No results found for this basename: DDIMER:2 in the last 72 hours No components found with this basename: POCBNP:3   RADIOLOGIC STUDIES ON ADMISSION: No results found.  ASSESSMENT AND PLAN: Present on Admission:  . Acute hepatitis - Given her IVDA-high suspicion for acute viral hepatitis - with worsening transaminitis - Will send out acute hepatitis serology along with hep B DNA and hep C RNA. -We'll check HIV antibody and also a viral load. -Check RPR -Monitor LFTs daily for now as they are trending up. Her coagulation profile is stable. -Patient has denied taking any recent ingestion  of  Tylenol or even Vicodin/Percocet-however will check a Tylenol level.  - If her transaminases continue to rise or she develops any sign of fulminant hepatic failure we will then consult gastroenterology  . Polysubstance dependence including opioid type drug, episodic abuse with history of drug-induced mood disorder - We'll obtain a psychiatric consultation  - Would continue the one-to-one sitter - She's been here since 12/5 and from what I can tell she has not received any narcotics. We will try and avoid narcotics as much as possible in this patient. - Will place on clonidine To help with narcotic withdrawal.  . Hypothyroid - Patient has not taken any Synthroid for past 6 months, we'll check a TSH before resuming Synthroid  . Suspected alcohol abuse - She should be on almost out of the window for any withdrawal symptoms that she has been here since 12/5 - Continue with CIWA protocol for the next few days  . Tobacco abuse - Transdermal nicotine  Further plan will depend as patient's clinical course evolves and further radiologic and laboratory data become available. Patient will be  monitored closely.  DVT Prophylaxis: - Prophylactic Lovenox   Code Status: - Full code  Total time spent for admission equals 45 minutes.  [facility] Pager [phone]  If 7PM-7AM, please contact night-coverage [email or web address] Password [facility] 09/05/2012, 3:34 PM

## 2012-09-05 NOTE — ED Notes (Signed)
Pt woken for morning vitals and assessment. Denies physical pain. Reports anxiety 6/10. Denies N/V/D/C. Respirations even and regular. NAD. Sitter at bedside.

## 2012-09-05 NOTE — ED Provider Notes (Addendum)
Patient's transaminases were elevated yesterday. Will recheck today. Patient likely to be admitted  Toy Baker, MD 09/05/12 1212\\  Patient with transaminases that are continually trending upward. She was examined and does have right upper quadrant pain. She'll be admitted to the internal medicine service   Toy Baker, MD 09/05/12 1351  2:18 PM Spoke with [facility] hospitalist, dr. Roseanna Rainbow, he will perform a consult on the patient and initiated orders for her workup for her elevated liver functions. He will see her again tomorrow for recheck. Patient would not be admitted at this time  Toy Baker, MD 09/05/12 1419

## 2012-09-06 ENCOUNTER — Inpatient Hospital Stay (HOSPITAL_COMMUNITY): Payer: Self-pay

## 2012-09-06 DIAGNOSIS — F1910 Other psychoactive substance abuse, uncomplicated: Secondary | ICD-10-CM

## 2012-09-06 DIAGNOSIS — R45851 Suicidal ideations: Secondary | ICD-10-CM

## 2012-09-06 LAB — URINALYSIS, ROUTINE W REFLEX MICROSCOPIC
Glucose, UA: NEGATIVE mg/dL
Ketones, ur: NEGATIVE mg/dL
Nitrite: NEGATIVE
Specific Gravity, Urine: 1.026 (ref 1.005–1.030)
Specific Gravity, Urine: 1.019 (ref 1.005–1.030)
Urobilinogen, UA: 1 mg/dL (ref 0.0–1.0)
pH: 6 (ref 5.0–8.0)
pH: 6 (ref 5.0–8.0)

## 2012-09-06 LAB — HEPATITIS PANEL, ACUTE
HCV Ab: REACTIVE — AB
Hep A IgM: NEGATIVE
Hepatitis B Surface Ag: NEGATIVE

## 2012-09-06 LAB — CBC
MCV: 94.9 fL (ref 78.0–100.0)
Platelets: 213 10*3/uL (ref 150–400)
RBC: 4.74 MIL/uL (ref 3.87–5.11)
WBC: 5.1 10*3/uL (ref 4.0–10.5)

## 2012-09-06 LAB — URINE MICROSCOPIC-ADD ON

## 2012-09-06 LAB — PROTIME-INR
INR: 0.98 (ref 0.00–1.49)
Prothrombin Time: 12.9 seconds (ref 11.6–15.2)

## 2012-09-06 LAB — HEPATIC FUNCTION PANEL
AST: 1198 U/L — ABNORMAL HIGH (ref 0–37)
Albumin: 3.6 g/dL (ref 3.5–5.2)
Total Protein: 7 g/dL (ref 6.0–8.3)

## 2012-09-06 LAB — BASIC METABOLIC PANEL
GFR calc Af Amer: 71 mL/min — ABNORMAL LOW (ref >90)
GFR calc non Af Amer: 61 mL/min — ABNORMAL LOW (ref >90)
Glucose, Bld: 93 mg/dL (ref 70–99)
Potassium: 3.8 mEq/L (ref 3.5–5.1)
Sodium: 138 mEq/L (ref 135–145)

## 2012-09-06 MED ORDER — LIDOCAINE 5 % EX PTCH
2.0000 | MEDICATED_PATCH | CUTANEOUS | Status: DC
  Administered 2012-09-06 – 2012-09-12 (×7): 2 via TRANSDERMAL
  Filled 2012-09-06 (×8): qty 2

## 2012-09-06 MED ORDER — IBUPROFEN 600 MG PO TABS
600.0000 mg | ORAL_TABLET | Freq: Four times a day (QID) | ORAL | Status: DC | PRN
  Administered 2012-09-06 – 2012-09-07 (×2): 600 mg via ORAL
  Filled 2012-09-06 (×3): qty 1

## 2012-09-06 MED ORDER — SENNOSIDES-DOCUSATE SODIUM 8.6-50 MG PO TABS
1.0000 | ORAL_TABLET | Freq: Two times a day (BID) | ORAL | Status: DC
  Administered 2012-09-06 – 2012-09-13 (×15): 1 via ORAL
  Filled 2012-09-06 (×14): qty 1

## 2012-09-06 MED ORDER — DEXTROSE 5 % IV SOLN: 1.0000 g | INTRAVENOUS | Status: DC

## 2012-09-06 MED ORDER — DEXTROSE 5 % IV SOLN
1.0000 g | INTRAVENOUS | Status: DC
  Filled 2012-09-06: qty 10

## 2012-09-06 MED ORDER — GABAPENTIN 100 MG PO CAPS
100.0000 mg | ORAL_CAPSULE | Freq: Three times a day (TID) | ORAL | Status: DC
  Administered 2012-09-06 (×2): 100 mg via ORAL
  Filled 2012-09-06 (×3): qty 1

## 2012-09-06 MED ORDER — TRAMADOL HCL 50 MG PO TABS
50.0000 mg | ORAL_TABLET | Freq: Once | ORAL | Status: AC
  Administered 2012-09-06: 50 mg via ORAL
  Filled 2012-09-06: qty 1

## 2012-09-06 MED ORDER — KETOROLAC TROMETHAMINE 30 MG/ML IJ SOLN
30.0000 mg | Freq: Four times a day (QID) | INTRAMUSCULAR | Status: AC | PRN
  Administered 2012-09-06 – 2012-09-10 (×13): 30 mg via INTRAVENOUS
  Filled 2012-09-06 (×13): qty 1

## 2012-09-06 MED ORDER — GABAPENTIN 100 MG PO CAPS
200.0000 mg | ORAL_CAPSULE | Freq: Three times a day (TID) | ORAL | Status: DC
  Administered 2012-09-06 – 2012-09-07 (×2): 200 mg via ORAL
  Filled 2012-09-06 (×4): qty 2

## 2012-09-06 MED ORDER — DEXTROSE 5 % IV SOLN
1.0000 g | Freq: Two times a day (BID) | INTRAVENOUS | Status: DC
  Administered 2012-09-06 – 2012-09-08 (×5): 1 g via INTRAVENOUS
  Filled 2012-09-06 (×6): qty 1
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
## 2012-09-06 NOTE — Progress Notes (Signed)
[facility] PROGRESS NOTE  Barbara Key:[id number] DOB: 04/23/1971 DOA: 09/02/2012 PCP: Dorrene German, MD  Assessment/Plan: Present on Admission:  . Acute hepatitis  - Given her IVDA-high suspicion for acute viral hepatitis - with transaminitis finally cresting. - Hep C Ab positive on acute hep panel.   hep C RNA pending. - HIV negative, RPR non-reactive.  - Monitor LFTs daily. Her coagulation profile is stable.  - Patient has denied taking any recent ingestion of Tylenol or even Vicodin/Percocet-however will check a Tylenol level.  - If her transaminases continue to rise or she develops any sign of fulminant hepatic failure we will then consult gastroenterology  - Abdominal ultrasound completed.  Results below.  Possible hepatic steatosis  . Polysubstance dependence including opioid type drug, episodic abuse with history of drug-induced mood disorder with suicidal ideation - We'll obtain a psychiatric consultation (Dr. Ferol Luz to see her 12/9) - Would continue the one-to-one sitter  - She's been here since 12/5 and from what I can tell she has not received any narcotics. We will try and avoid narcotics as much as possible in this patient.  - Will place on clonidine To help with narcotic withdrawal.   . Hypothyroid  - Patient has not taken any Synthroid for past 6 months, we'll check a TSH before resuming Synthroid   . alcohol abuse  - She should be on almost out of the window for any withdrawal symptoms that she has been here since 12/5  - Continue with CIWA protocol for the next few days   . Tobacco abuse  - Transdermal nicotine   Left sided back pain - Patient crying, requesting pain medication for her back. (she reports ibuprofen, toradol, heating pad, ice pack don't help) - Reports dysuria - Will check u/a for infection - u/a borderline for infection.  Will culture urine and start cefepime (at pharm and ID recommendation given transaminitis) - No visual  evidence of bruising or lesion.   Constipation - start bid colace.   Code Status: full Family Communication: Disposition Plan:  To be determined, Likely inpatient psych.   Consultants:  Psychiatry  Procedures:    Antibiotics:    HPI/Subjective: Patient is a 41 year old Caucasian female with a past medical history of hypothyroidism-noncompliant to Synthroid for the past 5-6 months, history of polysubstance abuse, drug induced mood disorder who presented to the ED with chest pain. She is an active IVDA with heroin and crack cocaine, and during her evaluation in the ED expressed suicidal ideations. She was observed in the ED and thought to need inpatient detoxification program. Routine labs were drawn and it showed significant transaminitis, these were followed and they started worsening and I was subsequently asked to admit this patient for further evaluation and treatment. During my evaluation , patient appears very tearful and complains of generalized aches and pains. She denies any fever. Her last use of illicit drugs was a day or so prior to presentation to the ED . Apart from doing IV heroine and crack cocaine, she also has a fairly significant amount of almost daily alcohol intake which she is very vague about, describes it around one pint of liquor almost every day or so. She also claims to take Percocet and Vicodin intermittently, upon repeatedly asking she claims that her last use of these medications were then 2 weeks ago. Patient claims that she has 2 kids, one of them lives with the father and the other lives with her ex-boyfriend. She was living with a  drug dealer who now is in prison. She has been sexually active with numerous persons.   12/9  Complaining of left sided back pain.   Objective: Filed Vitals:   09/05/12 1144 09/05/12 1631 09/05/12 2129 09/06/12 0554  BP: 106/73 125/87 104/74 119/89  Pulse: 90 85 76 71  Temp: 97.1 F (36.2 C) 98.2 F (36.8 C) 97.6 F  (36.4 C) 97.5 F (36.4 C)  TempSrc: Oral Oral Oral Oral  Resp: 16 16 18 18   SpO2: 97% 96% 94% 97%   No intake or output data in the 24 hours ending 09/06/12 0854 There were no vitals filed for this visit.  Exam:   General:  A&O, Tearful  Cardiovascular: RRR, no M/R/G  Respiratory: Some coarse bs, clear with cough.  No w/c/r  Abdomen: soft, nt, nd, +BS  Extremities: Able to move all four, no lower extremity edema  Skin:  To bruising, rashes, or lesions  Data Reviewed: Basic Metabolic Panel:  Lab 09/06/12 1610 09/05/12 1222 09/04/12 1413 09/02/12 0519  NA 138 137 -- 141  K 3.8 4.3 3.8 3.2*  CL 103 99 -- 105  CO2 26 27 -- 21  GLUCOSE 93 88 -- 90  BUN 14 17 -- 10  CREATININE 1.12* 1.32* -- 1.01  CALCIUM 8.8 9.9 -- 8.7  MG -- -- -- --  PHOS -- -- -- --   Liver Function Tests:  Lab 09/06/12 0505 09/05/12 1222 09/04/12 1413 09/02/12 0647  AST 1198* 1367* 1215* 1093*  ALT 1615* 1713* 1525* 1372*  ALKPHOS 242* 266* 238* 164*  BILITOT 0.7 0.9 1.0 0.6  PROT 7.0 7.7 7.4 7.7  ALBUMIN 3.6 4.2 3.8 4.1   CBC:  Lab 09/06/12 0505 09/05/12 1437 09/02/12 0519  WBC 5.1 6.7 6.4  NEUTROABS -- -- 3.9  HGB 14.7 15.1* 14.1  HCT 45.0 44.6 42.2  MCV 94.9 93.9 94.0  PLT 213 216 209   Cardiac Enzymes:  Lab 09/02/12 1201 09/02/12 0529  CKTOTAL -- --  CKMB -- --  CKMBINDEX -- --  TROPONINI <0.30 <0.30     Studies: US Abdomen Complete  09/06/2012  *RADIOLOGY REPORT*  Clinical Data:  Abnormal LFTs, hepatitis  COMPLETE ABDOMINAL ULTRASOUND  Comparison:  None.  Findings:  Gallbladder:  No gallstones, gallbladder wall thickening, or pericholecystic fluid.  Negative sonographic Murphy's sign.  Common bile duct:  Measures 5 mm.  Liver:  No focal lesion identified.  Mildly increased parenchymal echogenicity, suggesting hepatic steatosis.  IVC:  Appears normal.  Pancreas:  Visualized portions are grossly unremarkable.  Spleen:  Measures 5.1 cm.  Right Kidney:  Measures 13.5 cm.  No  mass or hydronephrosis.  Left Kidney:  Measures 12.3 cm.  No mass or hydronephrosis.  Abdominal aorta:  No aneurysm identified.  IMPRESSION: Possible hepatic steatosis.  Otherwise negative abdominal ultrasound.   Original Report Authenticated By: Charline Bills, M.D.     Scheduled Meds:    . cloNIDine  0.1 mg Oral TID  . enoxaparin (LOVENOX) injection  30 mg Subcutaneous Q24H  . folic acid  1 mg Oral Daily  . [COMPLETED] ketorolac  10 mg Oral Once  . LORazepam  0-4 mg Oral Q6H   Followed by  . LORazepam  0-4 mg Oral Q12H  . multivitamin with minerals  1 tablet Oral Daily  . nicotine  14 mg Transdermal Daily  . senna-docusate  1 tablet Oral BID  . thiamine  100 mg Oral Daily  . traZODone  50 mg Oral QHS  . [  DISCONTINUED] folic acid  1 mg Oral Daily  . [DISCONTINUED] LORazepam  0-4 mg Oral Q12H  . [DISCONTINUED] multivitamin with minerals  1 tablet Oral Daily  . [DISCONTINUED] nicotine  21 mg Transdermal Daily  . [DISCONTINUED] thiamine  100 mg Intravenous Daily  . [DISCONTINUED] thiamine  100 mg Intravenous Daily  . [DISCONTINUED] thiamine  100 mg Oral Daily   Continuous Infusions:    . sodium chloride 50 mL/hr at 09/05/12 1756    Principal Problem:  *Acute hepatitis Active Problems:  Polysubstance dependence including opioid type drug, episodic abuse  Hypothyroid  Suicidal ideation    Time spent: 30 min.    Conley Canal  [facility] Pager [phone]. If 8PM-8AM, please contact night-coverage at [email or web address], password [facility] 09/06/2012, 8:54 AM  LOS: 4 days

## 2012-09-06 NOTE — Progress Notes (Signed)
Clinical Social Work Department CLINICAL SOCIAL WORK PSYCHIATRY SERVICE LINE ASSESSMENT 09/06/2012  Patient:  Barbara Key  Account:  [id number]  Admit Date:  09/02/2012  Clinical Social Worker:  Unk Lightning, LCSW  Date/Time:  09/06/2012 09:30 AM Referred by:  Physician  Date referred:  09/06/2012 Reason for Referral  Substance Abuse  Behavioral Health Issues   Presenting Symptoms/Problems (In the person's/family's own words):   "I just want to go home."    Psych consulted for substance abuse and for SI.   Abuse/Neglect/Trauma History (check all that apply)  Denies history   Abuse/Neglect/Trauma Comments:   Psychiatric History (check all that apply)  Outpatient treatment   Psychiatric medications:  Patient reports she stopped taking medications because she lost her insurance and could no longer afford antidepressants.   Current Mental Health Hospitalizations/Previous Mental Health History:   Patient reports she was diagnosed with depression and anxiety problems. Patient reports that she was going to a clinic in [facility] but stopped going because she could not afford her medications.   Current provider:   N/A   Place and Date:   N/A   Current Medications:   albuterol, ibuprofen, ketorolac, LORazepam, LORazepam, ondansetron (ZOFRAN) IV, ondansetron, [DISCONTINUED] ibuprofen, [DISCONTINUED] ibuprofen, [DISCONTINUED] LORazepam, [DISCONTINUED] LORazepam, [DISCONTINUED] ondansetron                        . cloNIDine  0.1 mg Oral TID  . enoxaparin (LOVENOX) injection  30 mg Subcutaneous Q24H  . folic acid  1 mg Oral Daily  . LORazepam  0-4 mg Oral Q6H   Followed by     . LORazepam  0-4 mg Oral Q12H  . multivitamin with minerals  1 tablet Oral Daily  . nicotine  14 mg Transdermal Daily  . senna-docusate  1 tablet Oral BID  . thiamine  100 mg Oral Daily  . traZODone  50 mg Oral QHS  . [DISCONTINUED] folic acid  1 mg Oral Daily  . [DISCONTINUED] LORazepam  0-4 mg Oral  Q12H  . [DISCONTINUED] multivitamin with minerals  1 tablet Oral Daily  . [DISCONTINUED] nicotine  21 mg Transdermal Daily  . [DISCONTINUED] thiamine  100 mg Intravenous Daily  . [DISCONTINUED] thiamine  100 mg Intravenous Daily  . [DISCONTINUED] thiamine  100 mg Oral Daily   Previous Impatient Admission/Date/Reason:   Patient has gone to residential treatment for substance abuse. Patient denies any psych hospitalizations.   Emotional Health / Current Symptoms    Suicide/Self Harm  Suicide attempt in past (date/description)   Suicide attempt in the past:   Patient reports that she attempted to overdose on heroin a few months ago. Patient reports that she was taken to the hospital and released back home.   Other harmful behavior:   Patient denies any current SI or HI. Patient denies any SI during this hospitalization but in chart it is noted on 12/5 that patient could not contract for safety and was feeling hopeless.   Psychotic/Dissociative Symptoms  None reported   Other Psychotic/Dissociative Symptoms:    Attention/Behavioral Symptoms  Withdrawn   Other Attention / Behavioral Symptoms:    Cognitive Impairment  Within Normal Limits   Other Cognitive Impairment:    Mood and Adjustment  Flat    Stress, Anxiety, Trauma, Any Recent Loss/Stressor  Relationship  Other - See comment   Anxiety (frequency):   Phobia (specify):   Compulsive behavior (specify):   Obsessive behavior (specify):   Other:  Patient reports strained relationship with ex-husband, dtr and son. Patient has been through residential treatment in the past for substance use but has been unable to remain sober.   Substance Abuse/Use  Current substance use   SBIRT completed (please refer for detailed history):  Y  Self-reported substance use:   Patient reports that she started using prescription drugs in 2003. Patient started using heroin around 1 year ago. Patient states she has been using cocaine  for about 20 years. Patient uses alcohol as well but not on a consistent basis. Patient last used cocaine on day of admission.   Urinary Drug Screen Completed:  Y Alcohol level:   Positive for THC and cocaine    Environmental/Housing/Living Arrangement  With Biological Parent(s)   Who is in the home:   Dad   Emergency contact:  Dad   Financial  IPRS   Patient's Strengths and Goals (patient's own words):   Patient reports she wants to be sober. Patient has stable housing and reports that she does not have the urge to use when staying with dad.   Clinical Social Worker's Interpretive Summary:   CSW received referral to assess for any SI or HI and to evaluate substance use. CSW reviewed chart and met with patient at bedside. Sitter present.    CSW introduced myself and explained role. Patient is recently divorced with a 8 year old son and 53 year old dtr. Ex-husband has custody of dtr. Patient reports that she recently moved back in with dad and has been assisting him. Son was living with her and dad but recently moved out due to not getting along with patient's dad.    Patient reports that she was diagnosed with depression and anxiety. After divorce, patient lost her insurance and was unable to take her medications. Patient is not connected with any MH services in the area but was agreeable to referral. Patient denies any SI or HI. Patient reports that she attempted suicide by using heroin to OD. Patient reports that she was taken to [facility] and was discharged. At that time, patient dc to a drug dealer's house and reported that she continued to use. Recently, patient moved in with dad. Patient reports this is a positive environment and she desires to stay clean in order to be able to care for her dad. Patient denies any visual or auditory hallucinations. Patient reports feelings of sadness and feels anxious.    Patient discussed substance use with CSW. Patient reports she drinks beer  occasionally but reports this is not her primary drug. Patient has used cocaine for about 20 years "off and on" but started taking her son's prescription drugs when he hurt himself in 2003. Patient reports she became addicted and started using heroin as well to get a "better high." Patient has stayed at several residential treatment facilities including [facility] and was recently dc from [facility] [place]. Patient reports that she was unable to remain sober after treatment and refuses residential treatment at this time. Patient reports that she needs to stay at home with her dad. CSW and patient discussed the benefits of residential treatment but patient continues to refuse. Patient is interested in intensive outpatient services but reports that she does not have a car. Patient currently uses the bus system to navigate around town. CSW explained that she could find treatment on bus route.    Patient was minimally engaged throughout assessment with sporadic eye contact. Patient appears to minimize harmful effects of  substance use and continues to report that she wants to dc. Patient spoke with CSW and agreeable to stay in hospital until medically dc. Patient refusing residential treatment but agreeable to follow up on outpatient basis so that she can continue to live with her father. Patient able to reflect on history and identify triggers to use. Patient feels she can obtain sobriety by living with father but has been unsuccessful in the past. Patient denies SI or HI. Patient agreeable to CSW follow up.    CSW will staff case with psych MD. CSW has made several phone calls in order to set up services at dc.   Disposition:  Outpatient referral made/needed

## 2012-09-06 NOTE — Progress Notes (Addendum)
ANTIBIOTIC CONSULT NOTE - INITIAL  Pharmacy Consult for Cefepime Indication: UTI  No Known Allergies  Patient Measurements:   Body Weight: 74.8 kg  Vital Signs: Temp: 97.5 F (36.4 C) (12/09 0554) Temp src: Oral (12/09 0554) BP: 119/89 mmHg (12/09 0554) Pulse Rate: 71  (12/09 0554) Intake/Output from previous day:   Intake/Output from this shift:    Labs:  Basename 09/06/12 0505 09/05/12 1437 09/05/12 1222  WBC 5.1 6.7 --  HGB 14.7 15.1* --  PLT 213 216 --  LABCREA -- -- --  CREATININE 1.12* -- 1.32*   The CrCl is unknown because both a height and weight (above a minimum accepted value) are required for this calculation. No results found for this basename: VANCOTROUGH:2,VANCOPEAK:2,VANCORANDOM:2,GENTTROUGH:2,GENTPEAK:2,GENTRANDOM:2,TOBRATROUGH:2,TOBRAPEAK:2,TOBRARND:2,AMIKACINPEAK:2,AMIKACINTROU:2,AMIKACIN:2, in the last 72 hours   Microbiology: No results found for this or any previous visit (from the past 720 hour(s)).  Medical History: Past Medical History  Diagnosis Date  . Thyroid disease   . Polysubstance (excluding opioids) dependence     Medications:  No prescriptions prior to admission   Assessment: 41 yo presents to ED with chest pain.  She was admitted for evaluation of transaminitis.  Her history is significant for IVDA, polysubstance abuse, Alcohol abuse, and hypothyroidism noncompliant with synthroid.  She is also complaining of left sided back pain.  Ceftriaxone was changed to cefepime due to elevated LFTs  Goal of Therapy:  Eradication of infection  Plan:  Cefepime 1 gm IV q12 hours. F/u clinical course.  Manvi Guilliams Poteet 09/06/2012,12:00 PM

## 2012-09-06 NOTE — Progress Notes (Signed)
Patient seen and examined today. Complaining of back pain. Admission H&P and and assessment and plan outlined above reviewed. Will try low dose neurontin to see response to her pain. Agree avoiding narcotics. Continue NSAIDs and lidocaine patch. appreciate psych recommendations.  Will follow LFTs ( trending down slowly). Hep c ab positive. Follow HCV RNA.

## 2012-09-07 LAB — HEPATIC FUNCTION PANEL
ALT: 1673 U/L — ABNORMAL HIGH (ref 0–35)
Bilirubin, Direct: 0.3 mg/dL (ref 0.0–0.3)
Indirect Bilirubin: 0.5 mg/dL (ref 0.3–0.9)
Total Protein: 7.2 g/dL (ref 6.0–8.3)

## 2012-09-07 LAB — URINE CULTURE: Culture: NO GROWTH

## 2012-09-07 LAB — HIV ANTIBODY (ROUTINE TESTING W REFLEX): HIV: NONREACTIVE

## 2012-09-07 LAB — T4, FREE: Free T4: 0.24 ng/dL — ABNORMAL LOW (ref 0.80–1.80)

## 2012-09-07 LAB — PROTIME-INR
INR: 1.05 (ref 0.00–1.49)
Prothrombin Time: 13.6 seconds (ref 11.6–15.2)

## 2012-09-07 LAB — T3, FREE: T3, Free: <0.3 pg/mL — ABNORMAL LOW (ref 2.3–4.2)

## 2012-09-07 MED ORDER — BUSPIRONE HCL 15 MG PO TABS
15.0000 mg | ORAL_TABLET | Freq: Three times a day (TID) | ORAL | Status: DC
  Administered 2012-09-07 – 2012-09-13 (×17): 15 mg via ORAL
  Filled 2012-09-07 (×20): qty 1

## 2012-09-07 MED ORDER — METHADONE HCL 10 MG PO TABS
5.0000 mg | ORAL_TABLET | Freq: Three times a day (TID) | ORAL | Status: DC
  Administered 2012-09-07 – 2012-09-09 (×6): 5 mg via ORAL
  Filled 2012-09-07 (×6): qty 1

## 2012-09-07 MED ORDER — DULOXETINE HCL 30 MG PO CPEP
30.0000 mg | ORAL_CAPSULE | Freq: Every day | ORAL | Status: DC
  Administered 2012-09-07 – 2012-09-13 (×7): 30 mg via ORAL
  Filled 2012-09-07 (×8): qty 1

## 2012-09-07 MED ORDER — GABAPENTIN 400 MG PO CAPS
400.0000 mg | ORAL_CAPSULE | Freq: Three times a day (TID) | ORAL | Status: DC
  Administered 2012-09-07 – 2012-09-13 (×17): 400 mg via ORAL
  Filled 2012-09-07 (×20): qty 1

## 2012-09-07 MED ORDER — ENOXAPARIN SODIUM 40 MG/0.4ML ~~LOC~~ SOLN
40.0000 mg | SUBCUTANEOUS | Status: DC
  Administered 2012-09-07 – 2012-09-12 (×6): 40 mg via SUBCUTANEOUS
  Filled 2012-09-07 (×7): qty 0.4

## 2012-09-07 MED ORDER — LEVOTHYROXINE SODIUM 100 MCG IV SOLR
50.0000 ug | Freq: Every day | INTRAVENOUS | Status: DC
  Administered 2012-09-07 – 2012-09-09 (×3): 50 ug via INTRAVENOUS
  Filled 2012-09-07 (×4): qty 2.5

## 2012-09-07 MED ORDER — RISPERIDONE 2 MG PO TABS
2.0000 mg | ORAL_TABLET | Freq: Every day | ORAL | Status: DC
  Administered 2012-09-07 – 2012-09-12 (×6): 2 mg via ORAL
  Filled 2012-09-07 (×8): qty 1

## 2012-09-07 NOTE — Progress Notes (Signed)
Was asked to see patient by RN.  Patient states she is in extreme pain and can't get any relief.  She states that none of the pain medicine they are giving her is working.  Patient states that she has a plan for outpatient treatment and does not want to use drugs again.  Dr. Gonzella Lex called to ask about pain management and to inquire about need for Palliative medicine consult to manage her pain.  MD took down # for palliative medicine and plans to stop back by and see the patient again today.
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
## 2012-09-07 NOTE — Progress Notes (Signed)
Clinical Social Work Progress Note PSYCHIATRY SERVICE LINE 09/07/2012  Patient:  LASHEENA FRIEZE  Account:  [id number]  Admit Date:  09/02/2012  Clinical Social Worker:  Unk Lightning, LCSW  Date/Time:  09/07/2012 09:30 AM  Review of Patient  Overall Medical Condition:   "I'm in so much pain. I need more medication."    RN reports patient is not ready to dc due to medical reasons.   Participation Level:  Minimal  Participation Quality  Other - See comment   Other Participation Quality:   Patient crying and reporting back and side pain.   Affect  Depressed   Cognitive  Alert   Reaction to Medications/Concerns:   Patient feels she needs more pain medication. Patient wants to sign a contract regarding not misusing any pain medication.   Modes of Intervention  Support   Summary of Progress/Plan at Discharge   CSW reviewed chart which psych MD recommended detox. CSW spoke with bedside RN who reports that patient has been tearful and complaining of pain this morning.    CSW met with patient at bedside. Patient remembered CSW from previous day. Patient was crying and reports that she is in pain and needs more medication. Patient requesting to speak with director regarding signing a pain medicine contract. CSW spoke with patient regarding why she was crying and if she was depressed. Patient is remorseful of using drugs and reports that she feels she is being punished since she was using. Patient states she wishes she would have never used any substances. Patient denies any SI or HI but reports that she is just feeling depressed today. Patient feels depression with resolve when pain subsides.    CSW and patient discussed psych MD's recommendations for detox. Patient reports that she does not need to detox because she has already detoxed during hospital stay. Patient still refusing inpatient treatment. Patient inquired about outpatient services. CSW spoke with [facility]  for substance abuse Charmian Muff) who reports openings in the 8 week program. CSW received scholarship forms from [facility] and gave them to patient to complete.    CSW will continue to follow to assist with needs.

## 2012-09-07 NOTE — Care Management Note (Signed)
  Page 2 of 2   09/13/2012     10:53:12 AM   CARE MANAGEMENT NOTE 09/13/2012  Patient:  Barbara Key, Barbara Key   Account Number:  [id number]  Date Initiated:  09/07/2012  Documentation initiated by:  Ronny Flurry  Subjective/Objective Assessment:     Action/Plan:   active IVDA with heroin and crack cocaine, and during her evaluation in the ED expressed suicidal ideations   Anticipated DC Date:  09/13/2012   Anticipated DC Plan:  HOME/SELF CARE      DC Planning Services  [facility]      Choice offered to / List presented to:             Status of service:  Completed, signed off Medicare Important Message given?   (If response is "NO", the following Medicare IM given date fields will be blank) Date Medicare IM given:   Date Additional Medicare IM given:    Discharge Disposition:  HOME/SELF CARE  Per UR Regulation:    If discussed at Long Length of Stay Meetings, dates discussed:    Comments:  09-13-12 Patient for discharge today . Gave patient MATCH approval letter explained progress and $3 / presrciption . Paitent voiced understanding .  Ronny Flurry RN BSN [phone]   09-08-12 Spoke with Lottie Rater at [facility] .  She will call the patient directly with an appointment date and time . Patietn will be self pay . Ms Monia Pouch will also discuss payment payment with patient .  Ronny Flurry RN BSN [phone]   09-07-12 Referral received to arrange outpatient Hep C treatment at the [facility]. [facility] phone number  [phone].  Faxed  completed referral form and H and P and todays progress note to Lottie Rater at [facility] , fax [phone]  Awaiting call back with appointment  PCP DR Fleet Contras [phone]  Emergency Contact : Dr Glendon Axe ( Father ) [phone]  Ronny Flurry RN BSN [phone]

## 2012-09-07 NOTE — Progress Notes (Signed)
Patient seen and examined this morning. Complain of right low back pain in the left lower quadrant pain. No signs of obstruction or infectious etiology on clinical exam. Patient is afebrile, heart rate and blood pressure stable. Labs are suggestive of UTI. Does not need any imaging at this time. There is a possibility for  opiate withdrawal. She has been placed on clonidine without much help. I will start her on low-dose methadone. Continue other nonnarcotic medications for pain and depression. Appreciate psychiatry followup. Patient encouraged to ambulate -Monitor LFTs closely. Appreciate ID recommendations .

## 2012-09-07 NOTE — Progress Notes (Signed)
[facility] PROGRESS NOTE  Barbara Key EAV:[id number] DOB: May 26, 1971 DOA: 09/02/2012 PCP: Dorrene German, MD  Assessment/Plan: Present on Admission:  . Acute hepatitis  - etiology:   IVDA - transaminases plateaued. - Hep C Ab positive, hep C RNA positive.  Per [place] no acute treatment in the hospital.  Will ask case management to arrange outpatient Hep C treatment at the [facility]. - HIV negative, RPR non-reactive.  - Monitor LFTs daily. Her coagulation profile is stable.  Needs to remain inpatient until her transaminases show a trend down. - If her transaminases continue to rise or she develops any sign of fulminant hepatic failure we will then consult gastroenterology  - Abdominal ultrasound completed.  Results below.  Possible hepatic steatosis  . Polysubstance dependence including opioid type drug, episodic abuse with history of drug-induced mood disorder with suicidal ideation - We'll obtain a psychiatric consultation ( Thank you Dr. Ferol Luz 12/9) - Would continue the one-to-one sitter  - We will try and avoid narcotics as much as possible in this patient.  - placed on clonidine To help with narcotic withdrawal.  - Patient continually requesting opiate pain medication.  Has asked to speak to the director about signing a pain contract.  . Hypothyroid  - Thyroid ablated due to Graves Disease.. Patient has not taken any Synthroid for past 6 months - TSH 145.  Check free t3, free t4.  Started on IV synthroid.  Will d/c on po synthroid.  Marland Kitchen alcohol abuse  - out of the window for any withdrawal symptoms that she has been here since 12/5  - was on CIWA protocol  . Tobacco abuse  - Transdermal nicotine   Left sided back pain -12/10 patient reports right sided back pain rather than left sided back pain. - Patient crying, requesting pain medication for her back. (she reports ibuprofen, toradol, neurontin,  heating pad, ice pack don't help) -  No visual evidence of bruising or lesion - if patient develops fever or elevated WBC will consider imaging of her back to rule out abscess from IVDA.   UTI - Reports dysuria - u/a borderline for infection.  Urine culture pending. - Cefepime started 12/9   Constipation - bid colace.   Code Status: full Family Communication: Disposition Plan:  To be determined - patient refuses inpatient drug rehab.   Consultants:  Psychiatry  Procedures:    Antibiotics:  cefepime  HPI/Subjective: Patient is a 41 year old Caucasian female with a past medical history of hypothyroidism-noncompliant to Synthroid for the past 5-6 months, history of polysubstance abuse, drug induced mood disorder who presented to the ED with chest pain. She is an active IVDA with heroin and crack cocaine, and during her evaluation in the ED expressed suicidal ideations. She was observed in the ED and thought to need inpatient detoxification program. Routine labs were drawn and it showed significant transaminitis, these were followed and they started worsening and I was subsequently asked to admit this patient for further evaluation and treatment. During my evaluation , patient appears very tearful and complains of generalized aches and pains. She denies any fever. Her last use of illicit drugs was a day or so prior to presentation to the ED . Apart from doing IV heroine and crack cocaine, she also has a fairly significant amount of almost daily alcohol intake which she is very vague about, describes it around one pint of liquor almost every day or so. She also claims to take Percocet and Vicodin  intermittently, upon repeatedly asking she claims that her last use of these medications were then 2 weeks ago. Patient claims that she has 2 kids, one of them lives with the father and the other lives with her ex-boyfriend. She was living with a drug dealer who now is in prison. She has been sexually active with numerous persons.    12/9  Complaining of left sided back pain.   Objective: Filed Vitals:   09/06/12 2000 09/06/12 2113 09/07/12 0200 09/07/12 0555  BP: 105/76 105/76 105/76 100/68  Pulse: 66 66 66 73  Temp:  97.6 F (36.4 C)  98.2 F (36.8 C)  TempSrc:  Oral  Oral  Resp:  18  19  SpO2:  94%  96%    Intake/Output Summary (Last 24 hours) at 09/07/12 1209 Last data filed at 09/07/12 0939  Gross per 24 hour  Intake 1365.67 ml  Output      1 ml  Net 1364.67 ml   There were no vitals filed for this visit.  Exam:   General:  A&O, not tearful today.  Flat affect.  Cardiovascular: RRR, no M/R/G  Respiratory:  No w/c/r no accessory muscle use.  Abdomen: soft, nt, nd, +BS, no masses  Extremities: Able to move all four, no lower extremity edema  Skin:  To bruising, rashes, or lesions  Data Reviewed: Basic Metabolic Panel:  Lab 09/06/12 1610 09/05/12 1222 09/04/12 1413 09/02/12 0519  NA 138 137 -- 141  K 3.8 4.3 3.8 3.2*  CL 103 99 -- 105  CO2 26 27 -- 21  GLUCOSE 93 88 -- 90  BUN 14 17 -- 10  CREATININE 1.12* 1.32* -- 1.01  CALCIUM 8.8 9.9 -- 8.7  MG -- -- -- --  PHOS -- -- -- --   Liver Function Tests:  Lab 09/07/12 0849 09/06/12 0505 09/05/12 1222 09/04/12 1413 09/02/12 0647  AST 1257* 1198* 1367* 1215* 1093*  ALT 1673* 1615* 1713* 1525* 1372*  ALKPHOS 228* 242* 266* 238* 164*  BILITOT 0.8 0.7 0.9 1.0 0.6  PROT 7.2 7.0 7.7 7.4 7.7  ALBUMIN 3.6 3.6 4.2 3.8 4.1   CBC:  Lab 09/06/12 0505 09/05/12 1437 09/02/12 0519  WBC 5.1 6.7 6.4  NEUTROABS -- -- 3.9  HGB 14.7 15.1* 14.1  HCT 45.0 44.6 42.2  MCV 94.9 93.9 94.0  PLT 213 216 209   Cardiac Enzymes:  Lab 09/02/12 1201 09/02/12 0529  CKTOTAL -- --  CKMB -- --  CKMBINDEX -- --  TROPONINI <0.30 <0.30     Studies: US Abdomen Complete  09/06/2012  *RADIOLOGY REPORT*  Clinical Data:  Abnormal LFTs, hepatitis  COMPLETE ABDOMINAL ULTRASOUND  Comparison:  None.  Findings:  Gallbladder:  No gallstones, gallbladder wall  thickening, or pericholecystic fluid.  Negative sonographic Murphy's sign.  Common bile duct:  Measures 5 mm.  Liver:  No focal lesion identified.  Mildly increased parenchymal echogenicity, suggesting hepatic steatosis.  IVC:  Appears normal.  Pancreas:  Visualized portions are grossly unremarkable.  Spleen:  Measures 5.1 cm.  Right Kidney:  Measures 13.5 cm.  No mass or hydronephrosis.  Left Kidney:  Measures 12.3 cm.  No mass or hydronephrosis.  Abdominal aorta:  No aneurysm identified.  IMPRESSION: Possible hepatic steatosis.  Otherwise negative abdominal ultrasound.   Original Report Authenticated By: Charline Bills, M.D.     Scheduled Meds:    . ceFEPime (MAXIPIME) IV  1 g Intravenous Q12H  . cloNIDine  0.1 mg Oral TID  . DULoxetine  30 mg Oral Daily  . enoxaparin (LOVENOX) injection  30 mg Subcutaneous Q24H  . folic acid  1 mg Oral Daily  . gabapentin  400 mg Oral TID  . levothyroxine  50 mcg Intravenous QAC breakfast  . lidocaine  2 patch Transdermal Q24H  . LORazepam  0-4 mg Oral Q6H   Followed by  . LORazepam  0-4 mg Oral Q12H  . multivitamin with minerals  1 tablet Oral Daily  . nicotine  14 mg Transdermal Daily  . risperiDONE  2 mg Oral QHS  . senna-docusate  1 tablet Oral BID  . thiamine  100 mg Oral Daily  . [COMPLETED] traMADol  50 mg Oral Once  . traZODone  50 mg Oral QHS  . [DISCONTINUED] cefTRIAXone (ROCEPHIN)  IV  1 g Intravenous Q24H  . [DISCONTINUED] gabapentin  100 mg Oral TID  . [DISCONTINUED] gabapentin  200 mg Oral TID   Continuous Infusions:    . [DISCONTINUED] sodium chloride 50 mL/hr at 09/06/12 1021    Principal Problem:  *Acute hepatitis Active Problems:  Polysubstance dependence including opioid type drug, episodic abuse  Hypothyroid  Suicidal ideation    Time spent: 30 min.    Conley Canal  [facility] Pager [phone]. If 8PM-8AM, please contact night-coverage at [email or web address], password [facility] 09/07/2012, 12:09 PM  LOS:  5 days

## 2012-09-07 NOTE — Progress Notes (Signed)
Pt would like to speak with assistant director about pain management. Rn will notify AD when she arrives on unit.

## 2012-09-08 DIAGNOSIS — F1410 Cocaine abuse, uncomplicated: Secondary | ICD-10-CM

## 2012-09-08 LAB — HEPATIC FUNCTION PANEL
ALT: 1566 U/L — ABNORMAL HIGH (ref 0–35)
AST: 1192 U/L — ABNORMAL HIGH (ref 0–37)
Albumin: 3.4 g/dL — ABNORMAL LOW (ref 3.5–5.2)
Total Bilirubin: 0.6 mg/dL (ref 0.3–1.2)

## 2012-09-08 MED ORDER — WHITE PETROLATUM GEL
Status: AC
  Administered 2012-09-08: 17:00:00
  Filled 2012-09-08: qty 5

## 2012-09-08 MED ORDER — LACTULOSE 10 GM/15ML PO SOLN
10.0000 g | Freq: Every day | ORAL | Status: DC | PRN
  Administered 2012-09-08 – 2012-09-09 (×2): 10 g via ORAL
  Filled 2012-09-08 (×2): qty 15

## 2012-09-08 MED ORDER — NICOTINE 21 MG/24HR TD PT24
21.0000 mg | MEDICATED_PATCH | Freq: Every day | TRANSDERMAL | Status: DC
  Administered 2012-09-09 – 2012-09-12 (×4): 21 mg via TRANSDERMAL
  Filled 2012-09-08 (×6): qty 1

## 2012-09-08 MED ORDER — POLYETHYLENE GLYCOL 3350 17 G PO PACK
17.0000 g | PACK | Freq: Every day | ORAL | Status: DC
  Administered 2012-09-08 – 2012-09-09 (×2): 17 g via ORAL
  Filled 2012-09-08 (×2): qty 1

## 2012-09-08 NOTE — Progress Notes (Signed)
Clinical Social Work Progress Note PSYCHIATRY SERVICE LINE 09/08/2012  Patient:  Barbara Key  Account:  [id number]  Admit Date:  09/02/2012  Clinical Social Worker:  Unk Lightning, LCSW  Date/Time:  09/08/2012 10:00 AM  Review of Patient  Overall Medical Condition:   "I feel much better today." Patient sitting in bed with heating pad. Patient reports pain is reduced from yesterday.   Participation Level:  Active  Participation Quality  Appropriate   Other Participation Quality:   Affect  Appropriate   Cognitive  Appropriate   Reaction to Medications/Concerns:   None reported   Modes of Intervention  Support   Summary of Progress/Plan at Discharge   CSW met with patient at bedside. No family present. Sitter present. CSW staffed case with psych MD who requests patient receive information for Hepatitis C.    Patient sitting in bed and agreeable to session. Patient sitting up in bed and watching tv. Patient trying to order food for lunch. Patient inquires about being able to dc sitter in room and have a phone back. CSW informed RN about patient's concern with sitter. Patient reports no SI or HI. Patient reports she is not feeling depressed today but more concerned about medical wellbeing. CSW provided patient with Hepatitis C information that charge RN printed for patient. Patient reports she will review information and speak with MD if any further concerns arise.    Patient had completed scholarship application for intensive outpatient therapy at [facility] and spoke with representative at [facility] to set up services. CSW faxed information to [facility] for patient and gave her original copies to keep. Patient reports she was informed to call [facility] at dc and intake assessment for services will be scheduled.    Patient was engaged throughout assessment with appropriate eye contact and behavior. Patient participated in her plan of care and is motivated to have services at dc. Patient is actively  seeking help and has already spoken to father about plans for intensive outpatient treatment.

## 2012-09-08 NOTE — Progress Notes (Signed)
[facility] PROGRESS NOTE  Barbara VALLAS WUJ:[id number] DOB: [birth year] DOA: 09/02/2012 PCP: Dorrene German, MD  Assessment/Plan: 1. Acute hepatis acute hep c vs autoimmune disease- does have a history of IV drug abuse- ?whether or not Hep c is acute, will get GI consult and ANA, S pep and smooth muscle ab, ultrasound ok- outpatient hep c treatment at [facility] 2. polysubstance abuse- appreciate psych- has sitter- no active SI 3. Hypothyroid- synthroid resumed as patient has been off for 6 months 4. Chronic pain- methadone started and patient has had some relief 5. Alcohol abuse- CIWA 6. Tobacco abuse- nicotine patch 7. UTI- culture negative- d/c rocehpin 8. Constipation- colase  Code Status: full Family Communication: patient at bedside Disposition Plan: outpatient rehab once work up done   Consultants:  GI  Procedures:    Antibiotics:    HPI/Subjective: Feeling better Still has vague back pain   Objective: Filed Vitals:   09/07/12 0555 09/07/12 1427 09/07/12 2135 09/08/12 0511  BP: 100/68 107/71 93/66 97/57   Pulse: 73 74 64 64  Temp: 98.2 F (36.8 C) 97.3 F (36.3 C) 97.8 F (36.6 C) 97.9 F (36.6 C)  TempSrc: Oral Oral Oral Oral  Resp: 19 18 18 16   SpO2: 96% 95% 96% 97%    Intake/Output Summary (Last 24 hours) at 09/08/12 1331 Last data filed at 09/08/12 0855  Gross per 24 hour  Intake    510 ml  Output      5 ml  Net    505 ml   There were no vitals filed for this visit.  Exam:   General:  A+Ox3, NAD  Cardiovascular: rrr  Respiratory: clear, no wheezing  Abdomen: +BS, soft, NT, could not palpate liver  Data Reviewed: Basic Metabolic Panel:  Lab 09/06/12 9562 09/05/12 1222 09/04/12 1413 09/02/12 0519  NA 138 137 -- 141  K 3.8 4.3 3.8 3.2*  CL 103 99 -- 105  CO2 26 27 -- 21  GLUCOSE 93 88 -- 90  BUN 14 17 -- 10  CREATININE 1.12* 1.32* -- 1.01  CALCIUM 8.8 9.9 -- 8.7  MG -- -- -- --  PHOS -- -- -- --    Liver Function Tests:  Lab 09/08/12 0540 09/07/12 0849 09/06/12 0505 09/05/12 1222 09/04/12 1413  AST 1192* 1257* 1198* 1367* 1215*  ALT 1566* 1673* 1615* 1713* 1525*  ALKPHOS 219* 228* 242* 266* 238*  BILITOT 0.6 0.8 0.7 0.9 1.0  PROT 6.6 7.2 7.0 7.7 7.4  ALBUMIN 3.4* 3.6 3.6 4.2 3.8   No results found for this basename: LIPASE:5,AMYLASE:5 in the last 168 hours No results found for this basename: AMMONIA:5 in the last 168 hours CBC:  Lab 09/06/12 0505 09/05/12 1437 09/02/12 0519  WBC 5.1 6.7 6.4  NEUTROABS -- -- 3.9  HGB 14.7 15.1* 14.1  HCT 45.0 44.6 42.2  MCV 94.9 93.9 94.0  PLT 213 216 209   Cardiac Enzymes:  Lab 09/02/12 1201 09/02/12 0529  CKTOTAL -- --  CKMB -- --  CKMBINDEX -- --  TROPONINI <0.30 <0.30   BNP (last 3 results) No results found for this basename: PROBNP:3 in the last 8760 hours CBG: No results found for this basename: GLUCAP:5 in the last 168 hours  Recent Results (from the past 240 hour(s))  URINE CULTURE     Status: Normal   Collection Time   09/06/12  6:02 AM      Component Value Range Status Comment   Specimen Description URINE, RANDOM  Final    Special Requests ADDED 0631   Final    Culture  Setup Time 09/06/2012 06:32   Final    Colony Count 4,000 COLONIES/ML   Final    Culture INSIGNIFICANT GROWTH   Final    Report Status 09/07/2012 FINAL   Final   URINE CULTURE     Status: Normal   Collection Time   09/06/12 10:49 AM      Component Value Range Status Comment   Specimen Description URINE, RANDOM   Final    Special Requests NONE   Final    Culture  Setup Time 09/06/2012 11:30   Final    Colony Count NO GROWTH   Final    Culture NO GROWTH   Final    Report Status 09/07/2012 FINAL   Final      Studies: No results found.  Scheduled Meds:   . busPIRone  15 mg Oral TID  . ceFEPime (MAXIPIME) IV  1 g Intravenous Q12H  . cloNIDine  0.1 mg Oral TID  . DULoxetine  30 mg Oral Daily  . enoxaparin (LOVENOX) injection  40 mg  Subcutaneous Q24H  . folic acid  1 mg Oral Daily  . gabapentin  400 mg Oral TID  . levothyroxine  50 mcg Intravenous QAC breakfast  . lidocaine  2 patch Transdermal Q24H  . methadone  5 mg Oral Q8H  . multivitamin with minerals  1 tablet Oral Daily  . nicotine  14 mg Transdermal Daily  . risperiDONE  2 mg Oral QHS  . senna-docusate  1 tablet Oral BID  . thiamine  100 mg Oral Daily  . traZODone  50 mg Oral QHS  . [DISCONTINUED] enoxaparin (LOVENOX) injection  30 mg Subcutaneous Q24H   Continuous Infusions:   Principal Problem:  *Acute hepatitis Active Problems:  Polysubstance dependence including opioid type drug, episodic abuse  Hypothyroid  Suicidal ideation    Time spent: 35 min    Christean Silvestri  [facility] Pager [phone]. If 8PM-8AM, please contact night-coverage at [email or web address], password [facility] 09/08/2012, 1:31 PM  LOS: 6 days

## 2012-09-09 DIAGNOSIS — R079 Chest pain, unspecified: Secondary | ICD-10-CM

## 2012-09-09 DIAGNOSIS — K759 Inflammatory liver disease, unspecified: Secondary | ICD-10-CM

## 2012-09-09 DIAGNOSIS — F4310 Post-traumatic stress disorder, unspecified: Secondary | ICD-10-CM

## 2012-09-09 DIAGNOSIS — F1110 Opioid abuse, uncomplicated: Secondary | ICD-10-CM

## 2012-09-09 LAB — CBC
HCT: 38.3 % (ref 36.0–46.0)
MCV: 97.5 fL (ref 78.0–100.0)
RBC: 3.93 MIL/uL (ref 3.87–5.11)
WBC: 4.1 10*3/uL (ref 4.0–10.5)

## 2012-09-09 LAB — COMPREHENSIVE METABOLIC PANEL
BUN: 10 mg/dL (ref 6–23)
CO2: 28 mEq/L (ref 19–32)
Chloride: 102 mEq/L (ref 96–112)
Creatinine, Ser: 0.98 mg/dL (ref 0.50–1.10)
GFR calc non Af Amer: 72 mL/min — ABNORMAL LOW (ref >90)
Glucose, Bld: 112 mg/dL — ABNORMAL HIGH (ref 70–99)
Total Bilirubin: 0.6 mg/dL (ref 0.3–1.2)

## 2012-09-09 LAB — HEPATITIS B DNA, ULTRAQUANTITATIVE, PCR

## 2012-09-09 MED ORDER — OXYCODONE HCL 5 MG PO TABS
5.0000 mg | ORAL_TABLET | ORAL | Status: DC | PRN
  Administered 2012-09-09 – 2012-09-12 (×12): 5 mg via ORAL
  Filled 2012-09-09 (×12): qty 1

## 2012-09-09 MED ORDER — LEVOTHYROXINE SODIUM 100 MCG PO TABS
100.0000 ug | ORAL_TABLET | Freq: Every day | ORAL | Status: DC
  Administered 2012-09-10 – 2012-09-13 (×4): 100 ug via ORAL
  Filled 2012-09-09 (×6): qty 1

## 2012-09-09 MED ORDER — POLYETHYLENE GLYCOL 3350 17 G PO PACK: 17.0000 g | PACK | Freq: Every day | ORAL | Status: DC

## 2012-09-09 MED ORDER — POLYETHYLENE GLYCOL 3350 17 G PO PACK
17.0000 g | PACK | Freq: Three times a day (TID) | ORAL | Status: DC
  Administered 2012-09-09 – 2012-09-12 (×8): 17 g via ORAL
  Filled 2012-09-09 (×15): qty 1

## 2012-09-09 NOTE — Progress Notes (Signed)
[facility] PROGRESS NOTE  Barbara Key WGN:[id number] DOB: [birth year] DOA: 09/02/2012 PCP: Dorrene German, MD  Assessment/Plan: 1. Acute hepatis acute hep c vs autoimmune disease- does have a history of IV drug abuse- ?whether or not Hep c is acute, will get GI consult; ANA negative, S pep and smooth muscle ab pending, ultrasound ok- outpatient hep c treatment at [facility] 2. polysubstance abuse- appreciate psych- has sitter that i will d/c- no active SI 3. Hypothyroid- synthroid resumed as patient has been off for 6 months 4. Chronic pain- methadone changed to oxy IR 5. Alcohol abuse- CIWA 6. Tobacco abuse- nicotine patch 7. UTI- culture negative- d/c rocehpin 8. Constipation- colase, add lactlose and enema    Code Status: full Family Communication: patient at bedside Disposition Plan: outpatient rehab once work up done- hope for Friday   Consultants:  GI  Procedures:    Antibiotics:    HPI/Subjective: Still no BM No fever no chills Up walking   Objective: Filed Vitals:   09/08/12 2214 09/09/12 0511 09/09/12 0730 09/09/12 0853  BP: 112/79 108/69 116/89   Pulse: 69 69 64 69  Temp: 97.7 F (36.5 C) 97.5 F (36.4 C) 97.5 F (36.4 C)   TempSrc: Oral  Oral   Resp: 19 18 18    Weight:      SpO2: 96% 95% 99%     Intake/Output Summary (Last 24 hours) at 09/09/12 0924 Last data filed at 09/08/12 2100  Gross per 24 hour  Intake    100 ml  Output      1 ml  Net     99 ml   Filed Weights   09/08/12 1433  Weight: 74.8 kg (164 lb 14.5 oz)    Exam:   General:  A+Ox3, NAD  Cardiovascular: rrr  Respiratory: clear, no wheezing  Abdomen: +BS, soft, NT, could not palpate liver  Data Reviewed: Basic Metabolic Panel:  Lab 09/09/12 7846 09/06/12 0505 09/05/12 1222 09/04/12 1413  NA 139 138 137 --  K 4.2 3.8 4.3 3.8  CL 102 103 99 --  CO2 28 26 27  --  GLUCOSE 112* 93 88 --  BUN 10 14 17  --  CREATININE 0.98 1.12* 1.32*  --  CALCIUM 9.1 8.8 9.9 --  MG -- -- -- --  PHOS -- -- -- --   Liver Function Tests:  Lab 09/09/12 0530 09/08/12 0540 09/07/12 0849 09/06/12 0505 09/05/12 1222  AST 1253* 1192* 1257* 1198* 1367*  ALT 1629* 1566* 1673* 1615* 1713*  ALKPHOS 243* 219* 228* 242* 266*  BILITOT 0.6 0.6 0.8 0.7 0.9  PROT 6.5 6.6 7.2 7.0 7.7  ALBUMIN 3.3* 3.4* 3.6 3.6 4.2   No results found for this basename: LIPASE:5,AMYLASE:5 in the last 168 hours No results found for this basename: AMMONIA:5 in the last 168 hours CBC:  Lab 09/09/12 0530 09/06/12 0505 09/05/12 1437  WBC 4.1 5.1 6.7  NEUTROABS -- -- --  HGB 12.4 14.7 15.1*  HCT 38.3 45.0 44.6  MCV 97.5 94.9 93.9  PLT 186 213 216   Cardiac Enzymes:  Lab 09/02/12 1201  CKTOTAL --  CKMB --  CKMBINDEX --  TROPONINI <0.30   BNP (last 3 results) No results found for this basename: PROBNP:3 in the last 8760 hours CBG: No results found for this basename: GLUCAP:5 in the last 168 hours  Recent Results (from the past 240 hour(s))  URINE CULTURE     Status: Normal   Collection Time   09/06/12  6:02 AM      Component Value Range Status Comment   Specimen Description URINE, RANDOM   Final    Special Requests ADDED 0631   Final    Culture  Setup Time 09/06/2012 06:32   Final    Colony Count 4,000 COLONIES/ML   Final    Culture INSIGNIFICANT GROWTH   Final    Report Status 09/07/2012 FINAL   Final   URINE CULTURE     Status: Normal   Collection Time   09/06/12 10:49 AM      Component Value Range Status Comment   Specimen Description URINE, RANDOM   Final    Special Requests NONE   Final    Culture  Setup Time 09/06/2012 11:30   Final    Colony Count NO GROWTH   Final    Culture NO GROWTH   Final    Report Status 09/07/2012 FINAL   Final      Studies: No results found.  Scheduled Meds:    . busPIRone  15 mg Oral TID  . cloNIDine  0.1 mg Oral TID  . DULoxetine  30 mg Oral Daily  . enoxaparin (LOVENOX) injection  40 mg Subcutaneous Q24H   . folic acid  1 mg Oral Daily  . gabapentin  400 mg Oral TID  . levothyroxine  50 mcg Intravenous QAC breakfast  . lidocaine  2 patch Transdermal Q24H  . methadone  5 mg Oral Q8H  . multivitamin with minerals  1 tablet Oral Daily  . nicotine  21 mg Transdermal Daily  . polyethylene glycol  17 g Oral Daily  . risperiDONE  2 mg Oral QHS  . senna-docusate  1 tablet Oral BID  . thiamine  100 mg Oral Daily  . traZODone  50 mg Oral QHS  . [COMPLETED] white petrolatum      . [DISCONTINUED] ceFEPime (MAXIPIME) IV  1 g Intravenous Q12H  . [DISCONTINUED] nicotine  14 mg Transdermal Daily   Continuous Infusions:   Principal Problem:  *Acute hepatitis Active Problems:  Polysubstance dependence including opioid type drug, episodic abuse  Hypothyroid  Suicidal ideation    Time spent: 35 min    Barbara Key  [facility] Pager [phone]. If 8PM-8AM, please contact night-coverage at [email or web address], password [facility] 09/09/2012, 9:24 AM  LOS: 7 days

## 2012-09-09 NOTE — Progress Notes (Signed)
Clinical Social Work  CSW staffed case with psych MD and psych signing off. Patient has all information for outpatient services at dc and reports understanding for intensive outpatient therapy. Please contact me if further needs arise.  [place], [place] [phone]

## 2012-09-09 NOTE — Consult Note (Signed)
[facility] CONSULT Reason for consult: Elevation of liver test Referring Physician: Dr. Laury Deep is an 41 y.o. female.  HPI: Patient is an IV drug user and has been using crack cocaine. She was having chest pain the multitude of other symptoms apart admission to the hospital. Were asked to see her regarding her elevated liver test. Her transaminases has been up in the thousands. Her hepatitis profile was positive for hepatitis C negative for other causes. Qualitative hepatitis CRNA as positive. ANA was negative. Her other GI complaints rectal bleeding. Her mother did have colon cancer at 57 patient is a previous colonoscopies in [place] every 3 years. She has been constipated and says she has seen some bleeding. She also has had chronic pain and uses alcohol.  Past Medical History  Diagnosis Date  . Thyroid disease   . Polysubstance (excluding opioids) dependence     Past Surgical History  Procedure Date  . Foot surgery 3/13; 06/12    to repair a deformity: plate and screws to Rt then Lt foot  . Tubal ligation   . Endometrial ablation     Family History  Problem Relation Age of Onset  . Cancer Mother   . Alcohol abuse Father     Social History:  reports that she has been smoking Cigarettes.  She has a 26 pack-year smoking history. She has never used smokeless tobacco. She reports that she drinks alcohol. She reports that she uses illicit drugs ("Crack" cocaine, Heroin, Cocaine, and Marijuana).  Allergies: No Known Allergies  Medications;    . busPIRone  15 mg Oral TID  . cloNIDine  0.1 mg Oral TID  . DULoxetine  30 mg Oral Daily  . enoxaparin (LOVENOX) injection  40 mg Subcutaneous Q24H  . folic acid  1 mg Oral Daily  . gabapentin  400 mg Oral TID  . levothyroxine  100 mcg Oral QAC breakfast  . lidocaine  2 patch Transdermal Q24H  . multivitamin with minerals  1 tablet Oral Daily  . nicotine  21 mg Transdermal Daily  . polyethylene glycol  17 g  Oral TID  . risperiDONE  2 mg Oral QHS  . senna-docusate  1 tablet Oral BID  . thiamine  100 mg Oral Daily  . traZODone  50 mg Oral QHS   PRN Meds albuterol, ibuprofen, ketorolac, lactulose, ondansetron (ZOFRAN) IV, ondansetron, oxyCODONE Results for orders placed during the hospital encounter of 09/02/12 (from the past 48 hour(s))  HEPATIC FUNCTION PANEL     Status: Abnormal   Collection Time   09/08/12  5:40 AM      Component Value Range Comment   Total Protein 6.6  6.0 - 8.3 g/dL    Albumin 3.4 (*) 3.5 - 5.2 g/dL    AST 1610 (*) 0 - 37 U/L    ALT 1566 (*) 0 - 35 U/L    Alkaline Phosphatase 219 (*) 39 - 117 U/L    Total Bilirubin 0.6  0.3 - 1.2 mg/dL    Bilirubin, Direct 0.2  0.0 - 0.3 mg/dL    Indirect Bilirubin 0.4  0.3 - 0.9 mg/dL   ANA     Status: Normal   Collection Time   09/08/12  1:12 PM      Component Value Range Comment   ANA NEGATIVE  NEGATIVE   ANTI-SMOOTH MUSCLE ANTIBODY, IGG     Status: Abnormal   Collection Time   09/08/12  1:12 PM  Component Value Range Comment   F-Actin IgG 39 (*) <20 U   CBC     Status: Abnormal   Collection Time   09/09/12  5:30 AM      Component Value Range Comment   WBC 4.1  4.0 - 10.5 K/uL    RBC 3.93  3.87 - 5.11 MIL/uL    Hemoglobin 12.4  12.0 - 15.0 g/dL    HCT 16.1  09.6 - 04.5 %    MCV 97.5  78.0 - 100.0 fL    MCH 31.6  26.0 - 34.0 pg    MCHC 32.4  30.0 - 36.0 g/dL    RDW 40.9 (*) 81.1 - 15.5 %    Platelets 186  150 - 400 K/uL   COMPREHENSIVE METABOLIC PANEL     Status: Abnormal   Collection Time   09/09/12  5:30 AM      Component Value Range Comment   Sodium 139  135 - 145 mEq/L    Potassium 4.2  3.5 - 5.1 mEq/L    Chloride 102  96 - 112 mEq/L    CO2 28  19 - 32 mEq/L    Glucose, Bld 112 (*) 70 - 99 mg/dL    BUN 10  6 - 23 mg/dL    Creatinine, Ser 9.14  0.50 - 1.10 mg/dL    Calcium 9.1  8.4 - 78.2 mg/dL    Total Protein 6.5  6.0 - 8.3 g/dL    Albumin 3.3 (*) 3.5 - 5.2 g/dL    AST 9562 (*) 0 - 37 U/L    ALT  1629 (*) 0 - 35 U/L    Alkaline Phosphatase 243 (*) 39 - 117 U/L    Total Bilirubin 0.6  0.3 - 1.2 mg/dL    GFR calc non Af Amer 72 (*) >90 mL/min    GFR calc Af Amer 83 (*) >90 mL/min     No results found.             Blood pressure 116/79, pulse 68, temperature 98 F (36.7 C), temperature source Oral, resp. rate 18, height 5\' 7"  (1.702 m), weight 74.8 kg (164 lb 14.5 oz), SpO2 96.00%.  Physical exam:   Gen.-nonicteric white female no acute distress Heart-regular rate and rhythm without murmurs or gallops Lungs-clear Abdomen-good bowel sounds slightly distended generally soft and nontender  Assessment: 1. Elevated liver test probably due to acute hepatitis C. We will need to obtain quantitative RNA genotype 2. Constipation rectal bleeding. 3. Family history of colon cancer mother at age 73 4. IV drug use, drug and alcohol abuse   Plan: We will increase the MiraLAX in the hopes that we can improve her constipation. We will check hepatitis C. quantitative RNA. We'll plan on follow her along with you.   Barbara Key JR,Barbara Key L 09/09/2012, 6:33 PM

## 2012-09-10 DIAGNOSIS — K5900 Constipation, unspecified: Secondary | ICD-10-CM

## 2012-09-10 LAB — IRON AND TIBC
Iron: 208 ug/dL — ABNORMAL HIGH (ref 42–135)
TIBC: 351 ug/dL (ref 250–470)

## 2012-09-10 LAB — COMPREHENSIVE METABOLIC PANEL
Alkaline Phosphatase: 246 U/L — ABNORMAL HIGH (ref 39–117)
BUN: 7 mg/dL (ref 6–23)
Calcium: 9.2 mg/dL (ref 8.4–10.5)
GFR calc Af Amer: >90 mL/min (ref >90)
Glucose, Bld: 117 mg/dL — ABNORMAL HIGH (ref 70–99)
Potassium: 4 mEq/L (ref 3.5–5.1)
Total Protein: 6.8 g/dL (ref 6.0–8.3)

## 2012-09-10 LAB — PROTEIN ELECTROPHORESIS, SERUM
Albumin ELP: 57.8 % (ref 55.8–66.1)
Alpha-2-Globulin: 9.3 % (ref 7.1–11.8)
Beta Globulin: 6.8 % (ref 4.7–7.2)
Total Protein ELP: 6.8 g/dL (ref 6.0–8.3)

## 2012-09-10 MED ORDER — LACTULOSE 10 GM/15ML PO SOLN
30.0000 g | Freq: Every day | ORAL | Status: DC | PRN
  Administered 2012-09-12: 30 g via ORAL
  Filled 2012-09-10: qty 45

## 2012-09-10 MED ORDER — PEG 3350-KCL-NA BICARB-NACL 420 G PO SOLR
4000.0000 mL | Freq: Once | ORAL | Status: AC
  Administered 2012-09-10: 4000 mL via ORAL
  Filled 2012-09-10: qty 4000

## 2012-09-10 NOTE — Progress Notes (Signed)
Subjective: Interval History: called that patient had fallen in bathroom. Patient states that she actually was getting up from toilet and felt weak and slide to floor. Denies injury. Denies pain besides cramping pain in abdomen.  Objective: Vital signs in last 24 hours: Temp:  [97.5 F (36.4 C)-98.1 F (36.7 C)] 98.1 F (36.7 C) (12/13 2134) Pulse Rate:  [79-81] 81  (12/13 2134) Resp:  [18] 18  (12/13 2134) BP: (106-139)/(76-101) 110/81 mmHg (12/13 2134) SpO2:  [94 %-99 %] 94 % (12/13 2134)  Intake/Output from previous day: 12/12 0701 - 12/13 0700 In: 960 [P.O.:960] Out: 1 [Urine:1] Intake/Output this shift:    BP 110/81  Pulse 81  Temp 98.1 F (36.7 C) (Oral)  Resp 18  Ht 5\' 7"  (1.702 m)  Wt 74.8 kg (164 lb 14.5 oz)  BMI 25.83 kg/m2  SpO2 94% General appearance: alert and no distress Skin: no bruising, abrasions or lumps appreciated   Scheduled Meds:   . busPIRone  15 mg Oral TID  . cloNIDine  0.1 mg Oral TID  . DULoxetine  30 mg Oral Daily  . enoxaparin (LOVENOX) injection  40 mg Subcutaneous Q24H  . folic acid  1 mg Oral Daily  . gabapentin  400 mg Oral TID  . levothyroxine  100 mcg Oral QAC breakfast  . lidocaine  2 patch Transdermal Q24H  . multivitamin with minerals  1 tablet Oral Daily  . nicotine  21 mg Transdermal Daily  . polyethylene glycol  17 g Oral TID  . risperiDONE  2 mg Oral QHS  . senna-docusate  1 tablet Oral BID  . thiamine  100 mg Oral Daily  . traZODone  50 mg Oral QHS   Continuous Infusions:  PRN Meds:albuterol, ibuprofen, ketorolac, lactulose, ondansetron (ZOFRAN) IV, ondansetron, oxyCODONE  Assessment/Plan: Fall: no injury apparent. Vital signs stable. Fall precautions initiated.   LOS: 8 days   Cruzito Standre A.

## 2012-09-10 NOTE — Progress Notes (Signed)
Chaplain visited patient upon her request to see a Chaplain. Patient was awake and responsive. Patient was depress and in distress. Patient expressed concerns about her drug addiction and the path it was leading her to. Patient also expressed concerns and worries about her family relationships and her spiritual life with regards to faith. Chaplain provided ministry of presence and empathic listening. Chaplain shared words of hope and encouragement with patient. Chaplain prayed with patient and will continue to follow-up as needed. Patient expressed her joy for Chaplain's visit and spiritual care.

## 2012-09-10 NOTE — Progress Notes (Signed)
[facility] PROGRESS NOTE  Barbara Key ZOX:[id number] DOB: 04-Mar-1971 DOA: 09/02/2012 PCP: Dorrene German, MD  Assessment/Plan: 1. Acute hepatis acute hep c vs autoimmune disease- does have a history of IV drug abuse- ?whether or not Hep c is acute, appreciate GI consult; ANA negative, S pep and smooth muscle ab pending, ultrasound ok- outpatient hep c treatment at [facility] 2. polysubstance abuse- appreciate psych- has sitter that i will d/c- no active SI 3. Hypothyroid- synthroid resumed as patient has been off for 6 months 4. Chronic pain- methadone changed to oxy IR 5. Alcohol abuse- CIWA 6. Tobacco abuse- nicotine patch 7. UTI- culture negative- d/c rocehpin 8. Constipation- colase, add lactlose and enema    Code Status: full Family Communication: patient at bedside Disposition Plan: outpatient rehab once work up done- hope for d/c after BM   Consultants:  GI  Procedures:    Antibiotics:    HPI/Subjective: Still no BM No fever no chills Up walking   Objective: Filed Vitals:   09/09/12 0900 09/09/12 1358 09/09/12 2202 09/10/12 0515  BP:  116/79 125/91 109/85  Pulse:  68 85 79  Temp:  98 F (36.7 C) 97.8 F (36.6 C) 97.5 F (36.4 C)  TempSrc:   Oral   Resp:  18 18 18   Height: 5\' 7"  (1.702 m)     Weight:      SpO2:  96% 96% 97%    Intake/Output Summary (Last 24 hours) at 09/10/12 0928 Last data filed at 09/09/12 1900  Gross per 24 hour  Intake    600 ml  Output      0 ml  Net    600 ml   Filed Weights   09/08/12 1433  Weight: 74.8 kg (164 lb 14.5 oz)    Exam:   General:  A+Ox3, NAD  Cardiovascular: rrr  Respiratory: clear, no wheezing  Abdomen: +BS, soft, NT, could not palpate liver  Data Reviewed: Basic Metabolic Panel:  Lab 09/10/12 8119 09/09/12 0530 09/06/12 0505 09/05/12 1222 09/04/12 1413  NA 135 139 138 137 --  K 4.0 4.2 3.8 4.3 3.8  CL 96 102 103 99 --  CO2 29 28 26 27  --  GLUCOSE 117*  112* 93 88 --  BUN 7 10 14 17  --  CREATININE 0.90 0.98 1.12* 1.32* --  CALCIUM 9.2 9.1 8.8 9.9 --  MG -- -- -- -- --  PHOS -- -- -- -- --   Liver Function Tests:  Lab 09/10/12 0555 09/09/12 0530 09/08/12 0540 09/07/12 0849 09/06/12 0505  AST 1156* 1253* 1192* 1257* 1198*  ALT 1569* 1629* 1566* 1673* 1615*  ALKPHOS 246* 243* 219* 228* 242*  BILITOT 0.8 0.6 0.6 0.8 0.7  PROT 6.8 6.5 6.6 7.2 7.0  ALBUMIN 3.5 3.3* 3.4* 3.6 3.6   No results found for this basename: LIPASE:5,AMYLASE:5 in the last 168 hours No results found for this basename: AMMONIA:5 in the last 168 hours CBC:  Lab 09/09/12 0530 09/06/12 0505 09/05/12 1437  WBC 4.1 5.1 6.7  NEUTROABS -- -- --  HGB 12.4 14.7 15.1*  HCT 38.3 45.0 44.6  MCV 97.5 94.9 93.9  PLT 186 213 216   Cardiac Enzymes: No results found for this basename: CKTOTAL:5,CKMB:5,CKMBINDEX:5,TROPONINI:5 in the last 168 hours BNP (last 3 results) No results found for this basename: PROBNP:3 in the last 8760 hours CBG: No results found for this basename: GLUCAP:5 in the last 168 hours  Recent Results (from the past 240 hour(s))  URINE CULTURE     Status: Normal   Collection Time   09/06/12  6:02 AM      Component Value Range Status Comment   Specimen Description URINE, RANDOM   Final    Special Requests ADDED 0631   Final    Culture  Setup Time 09/06/2012 06:32   Final    Colony Count 4,000 COLONIES/ML   Final    Culture INSIGNIFICANT GROWTH   Final    Report Status 09/07/2012 FINAL   Final   URINE CULTURE     Status: Normal   Collection Time   09/06/12 10:49 AM      Component Value Range Status Comment   Specimen Description URINE, RANDOM   Final    Special Requests NONE   Final    Culture  Setup Time 09/06/2012 11:30   Final    Colony Count NO GROWTH   Final    Culture NO GROWTH   Final    Report Status 09/07/2012 FINAL   Final      Studies: No results found.  Scheduled Meds:    . busPIRone  15 mg Oral TID  . cloNIDine  0.1 mg Oral  TID  . DULoxetine  30 mg Oral Daily  . enoxaparin (LOVENOX) injection  40 mg Subcutaneous Q24H  . folic acid  1 mg Oral Daily  . gabapentin  400 mg Oral TID  . levothyroxine  100 mcg Oral QAC breakfast  . lidocaine  2 patch Transdermal Q24H  . multivitamin with minerals  1 tablet Oral Daily  . nicotine  21 mg Transdermal Daily  . polyethylene glycol  17 g Oral TID  . risperiDONE  2 mg Oral QHS  . senna-docusate  1 tablet Oral BID  . thiamine  100 mg Oral Daily  . traZODone  50 mg Oral QHS   Continuous Infusions:   Principal Problem:  *Acute hepatitis Active Problems:  Polysubstance dependence including opioid type drug, episodic abuse  Hypothyroid  Suicidal ideation    Time spent: 35 min    Brandt Chaney  [facility] Pager [phone]. If 8PM-8AM, please contact night-coverage at [email or web address], password [facility] 09/10/2012, 9:28 AM  LOS: 8 days

## 2012-09-10 NOTE — Progress Notes (Signed)
[facility] PROGRESS NOTE Subjective Patient continues to complain of bloating, lower abdominal pain and failure to pass bowel movement. Again it is notable that her mother died of colon cancer at age 39. The patient had previous colonoscopies done in [place] and is overdue for a repeat. She states that she is having intermittent bleeding.  Objective: Vital signs in last 24 hours: Temp:  [97.5 F (36.4 C)-98 F (36.7 C)] 97.5 F (36.4 C) (12/13 0515) Pulse Rate:  [68-85] 79  (12/13 0515) Resp:  [18] 18  (12/13 0515) BP: (109-125)/(79-91) 109/85 mmHg (12/13 0515) SpO2:  [96 %-97 %] 97 % (12/13 0515) Last BM Date:  ("two weeks ago")  Intake/Output from previous day: 12/12 0701 - 12/13 0700 In: 960 [P.O.:960] Out: 1 [Urine:1] Intake/Output this shift:    PE: Gen.-no acute distress Abdomen-minimal if any distention soft and nontender  Lab Results:  Basename 09/09/12 0530  WBC 4.1  HGB 12.4  HCT 38.3  PLT 186   BMET  Basename 09/10/12 0555 09/09/12 0530  NA 135 139  K 4.0 4.2  CL 96 102  CO2 29 28  CREATININE 0.90 0.98   LFT  Basename 09/10/12 0555 09/09/12 0530 09/08/12 0540  PROT 6.8 6.5 6.6  AST 1156* 1253* 1192*  ALT 1569* 1629* 1566*  ALKPHOS 246* 243* 219*  BILITOT 0.8 0.6 0.6  BILIDIR -- -- 0.2  IBILI -- -- 0.4   PT/INR  Basename 09/07/12 1231  LABPROT 13.6  INR 1.05   PANCREAS No results found for this basename: LIPASE:3 in the last 72 hours       Studies/Results: No results found.  Medications: I have reviewed the patient's current medications.  Assessment/Plan: 1. Abnormal liver tests. Patient has active hepatitis C and hepatitis C quantitative RNA and genotyping is pending. ANA is negative. Iron studies are abnormal with elevated serum iron and saturation. Patient is not aware of any history of liver disease or family. Possible explanations for her elevated liver test are active acute hepatitis C, hemachromatosis, with  autoimmune hepatitis somewhat less likely. We will plan on waiting for the results of the quantitative hepatitis C RNA and consider a liver biopsy. 2. Constipation/rectal bleeding/strong family history of colon CA. The patient probably should have colonoscopy While here particularly in view of the fact that she seems to be unable to pass her bowel movements. I discussed this with her and she is eager to get this done. We'll try to get this arranged tomorrow by Dr. Madilyn Fireman. I have discussed this with her.   Noboru Bidinger JR,Glennette Galster L 09/10/2012, 9:47 AM

## 2012-09-11 ENCOUNTER — Encounter (HOSPITAL_COMMUNITY): Payer: Self-pay | Admitting: Gastroenterology

## 2012-09-11 ENCOUNTER — Inpatient Hospital Stay (HOSPITAL_COMMUNITY): Payer: MEDICAID

## 2012-09-11 ENCOUNTER — Encounter (HOSPITAL_COMMUNITY)
Admission: EM | Disposition: A | Discharge status: Home / Self Care | Payer: Self-pay | Source: Home / Self Care | Attending: Internal Medicine

## 2012-09-11 DIAGNOSIS — K5900 Constipation, unspecified: Secondary | ICD-10-CM

## 2012-09-11 HISTORY — PX: FLEXIBLE SIGMOIDOSCOPY: SHX5431

## 2012-09-11 LAB — HEPATIC FUNCTION PANEL
AST: 971 U/L — ABNORMAL HIGH (ref 0–37)
Bilirubin, Direct: 0.6 mg/dL — ABNORMAL HIGH (ref 0.0–0.3)

## 2012-09-11 SURGERY — SIGMOIDOSCOPY, FLEXIBLE
Anesthesia: Moderate Sedation

## 2012-09-11 MED ORDER — MIDAZOLAM HCL 5 MG/ML IJ SOLN
INTRAMUSCULAR | Status: AC
  Filled 2012-09-11: qty 2

## 2012-09-11 MED ORDER — FENTANYL CITRATE 0.05 MG/ML IJ SOLN
INTRAMUSCULAR | Status: AC
  Filled 2012-09-11: qty 4

## 2012-09-11 MED ORDER — SODIUM CHLORIDE 0.9 % IV SOLN
INTRAVENOUS | Status: DC
  Administered 2012-09-11 (×2): 500 mL via INTRAVENOUS

## 2012-09-11 MED ORDER — FLEET ENEMA 7-19 GM/118ML RE ENEM
1.0000 | ENEMA | Freq: Two times a day (BID) | RECTAL | Status: AC
  Administered 2012-09-11 (×2): 1 via RECTAL
  Filled 2012-09-11 (×3): qty 1

## 2012-09-11 MED ORDER — DIPHENHYDRAMINE HCL 50 MG/ML IJ SOLN
INTRAMUSCULAR | Status: AC
  Filled 2012-09-11: qty 1

## 2012-09-11 MED ORDER — FENTANYL CITRATE 0.05 MG/ML IJ SOLN
INTRAMUSCULAR | Status: DC | PRN
  Administered 2012-09-11: 25 ug via INTRAVENOUS

## 2012-09-11 MED ORDER — MIDAZOLAM HCL 5 MG/5ML IJ SOLN
INTRAMUSCULAR | Status: DC | PRN
  Administered 2012-09-11: 2 mg via INTRAVENOUS

## 2012-09-11 MED ORDER — DIPHENHYDRAMINE HCL 50 MG/ML IJ SOLN
INTRAMUSCULAR | Status: DC | PRN
  Administered 2012-09-11: 50 mg via INTRAVENOUS

## 2012-09-11 NOTE — Progress Notes (Signed)
[facility] PROGRESS NOTE  Barbara Key GNF:[id number] DOB: Aug 24, 1971 DOA: 09/02/2012 PCP: Barbara German, MD  Assessment/Plan: 1. Acute hepatis acute hep c vs autoimmune disease- does have a history of IV drug abuse- ?whether or not Hep c is acute, appreciate GI consult; ANA negative, S pep ok and smooth muscle ab pending, ultrasound ok- outpatient hep c treatment at [facility] 2. polysubstance abuse- appreciate psych- no active SI- sitter d/c'd, patient has capacity 3. Hypothyroid- synthroid resumed as patient has been off for 6 months 4. Chronic pain- methadone changed to oxy IR 5. Alcohol abuse- CIWA 6. Tobacco abuse- nicotine patch 7. UTI- culture negative- d/c rocehpin 8. Constipation- colase, add lactlose and enema- due to pain medication/hypothryroidism- for colonoscopy    Code Status: full Family Communication: patient at bedside Disposition Plan: outpatient rehab once work up done   Consultants:  GI  Procedures:    Antibiotics:    HPI/Subjective: Still no BM C/o swelling in right foot- where surgery had been done  Objective: Filed Vitals:   09/10/12 2028 09/10/12 2134 09/11/12 0219 09/11/12 0614  BP: 139/101 110/81 109/81 101/60  Pulse: 79 81 75 70  Temp: 97.7 F (36.5 C) 98.1 F (36.7 C) 98 F (36.7 C) 98.2 F (36.8 C)  TempSrc: Oral Oral Oral Oral  Resp: 18 18 18 18   Height:      Weight:      SpO2: 97% 94% 94% 95%    Intake/Output Summary (Last 24 hours) at 09/11/12 0830 Last data filed at 09/10/12 1900  Gross per 24 hour  Intake    240 ml  Output      0 ml  Net    240 ml   Filed Weights   09/08/12 1433  Weight: 74.8 kg (164 lb 14.5 oz)    Exam:   General:  A+Ox3, NAD  Cardiovascular: rrr  Respiratory: clear, no wheezing  Abdomen: +BS, soft, NT, could not palpate liver  Data Reviewed: Basic Metabolic Panel:  Lab 09/10/12 8469 09/09/12 0530 09/06/12 0505 09/05/12 1222 09/04/12 1413  NA 135  139 138 137 --  K 4.0 4.2 3.8 4.3 3.8  CL 96 102 103 99 --  CO2 29 28 26 27  --  GLUCOSE 117* 112* 93 88 --  BUN 7 10 14 17  --  CREATININE 0.90 0.98 1.12* 1.32* --  CALCIUM 9.2 9.1 8.8 9.9 --  MG -- -- -- -- --  PHOS -- -- -- -- --   Liver Function Tests:  Lab 09/11/12 0615 09/10/12 0555 09/09/12 0530 09/08/12 0540 09/07/12 0849  AST 971* 1156* 1253* 1192* 1257*  ALT 1379* 1569* 1629* 1566* 1673*  ALKPHOS 226* 246* 243* 219* 228*  BILITOT 1.0 0.8 0.6 0.6 0.8  PROT 6.5 6.8 6.5 6.6 7.2  ALBUMIN 3.3* 3.5 3.3* 3.4* 3.6   No results found for this basename: LIPASE:5,AMYLASE:5 in the last 168 hours No results found for this basename: AMMONIA:5 in the last 168 hours CBC:  Lab 09/09/12 0530 09/06/12 0505 09/05/12 1437  WBC 4.1 5.1 6.7  NEUTROABS -- -- --  HGB 12.4 14.7 15.1*  HCT 38.3 45.0 44.6  MCV 97.5 94.9 93.9  PLT 186 213 216   Cardiac Enzymes: No results found for this basename: CKTOTAL:5,CKMB:5,CKMBINDEX:5,TROPONINI:5 in the last 168 hours BNP (last 3 results) No results found for this basename: PROBNP:3 in the last 8760 hours CBG: No results found for this basename: GLUCAP:5 in the last 168 hours  Recent Results (from the  past 240 hour(s))  URINE CULTURE     Status: Normal   Collection Time   09/06/12  6:02 AM      Component Value Range Status Comment   Specimen Description URINE, RANDOM   Final    Special Requests ADDED 0631   Final    Culture  Setup Time 09/06/2012 06:32   Final    Colony Count 4,000 COLONIES/ML   Final    Culture INSIGNIFICANT GROWTH   Final    Report Status 09/07/2012 FINAL   Final   URINE CULTURE     Status: Normal   Collection Time   09/06/12 10:49 AM      Component Value Range Status Comment   Specimen Description URINE, RANDOM   Final    Special Requests NONE   Final    Culture  Setup Time 09/06/2012 11:30   Final    Colony Count NO GROWTH   Final    Culture NO GROWTH   Final    Report Status 09/07/2012 FINAL   Final       Studies: No results found.  Scheduled Meds:    . busPIRone  15 mg Oral TID  . cloNIDine  0.1 mg Oral TID  . DULoxetine  30 mg Oral Daily  . enoxaparin (LOVENOX) injection  40 mg Subcutaneous Q24H  . folic acid  1 mg Oral Daily  . gabapentin  400 mg Oral TID  . levothyroxine  100 mcg Oral QAC breakfast  . lidocaine  2 patch Transdermal Q24H  . multivitamin with minerals  1 tablet Oral Daily  . nicotine  21 mg Transdermal Daily  . polyethylene glycol  17 g Oral TID  . risperiDONE  2 mg Oral QHS  . senna-docusate  1 tablet Oral BID  . sodium phosphate  1 enema Rectal BID  . thiamine  100 mg Oral Daily  . traZODone  50 mg Oral QHS   Continuous Infusions:   Principal Problem:  *Acute hepatitis Active Problems:  Polysubstance dependence including opioid type drug, episodic abuse  Hypothyroid  Suicidal ideation  Constipation    Time spent: 35 min    Barbara Key  [facility] Pager [phone]. If 8PM-8AM, please contact night-coverage at [email or web address], password [facility] 09/11/2012, 8:30 AM  LOS: 9 days

## 2012-09-11 NOTE — Progress Notes (Signed)
Last evening approx.2020pm pt called for help getting out of bathroom. Nurse tech,Derrick White, found pt lying in bathroom floor. Called to room;pt crying about abdominal pain.  Denied any other pain. Assisted pt back to bed with lift. Vital signs obtained.  Neuro assesment and physical head to toe assesment WNL .  Pt denied hitting head. Pt said she was nauseous-she had finished a gallon of golytely at 1830-was crouching over toilet and then was so weak she slid to floor.  Elray Mcgregor NP on call for [facility] notified.  Bed alarm turned on and BSC in place. Call bell with in pt's reach and she was instructed to call for assist if she needed to get up.  She agreed that she would.  No bumps,bruises,abrasions,lacerations or wounds of any kind observed.  Will continue to monitor.

## 2012-09-11 NOTE — Progress Notes (Signed)
[facility] Progress Note  Subjective: Patient states she consumed her entire bowel prep but very little stool output reported by the nurses. I ordered fleets enemas this morning. She has no acute complaints  Objective: Vital signs in last 24 hours: Temp:  [97.7 F (36.5 C)-98.6 F (37 C)] 98.5 F (36.9 C) (12/14 1318) Pulse Rate:  [67-81] 67  (12/14 1318) Resp:  [15-21] 15  (12/14 1318) BP: (90-139)/(52-101) 91/55 mmHg (12/14 1318) SpO2:  [90 %-100 %] 99 % (12/14 1318) Weight change:    PE: Abdomen soft nondistended with normoactive bowel sounds appear  Lab Results: Results for orders placed during the hospital encounter of 09/02/12 (from the past 24 hour(s))  HEPATIC FUNCTION PANEL     Status: Abnormal   Collection Time   09/11/12  6:15 AM      Component Value Range   Total Protein 6.5  6.0 - 8.3 g/dL   Albumin 3.3 (*) 3.5 - 5.2 g/dL   AST 161 (*) 0 - 37 U/L   ALT 1379 (*) 0 - 35 U/L   Alkaline Phosphatase 226 (*) 39 - 117 U/L   Total Bilirubin 1.0  0.3 - 1.2 mg/dL   Bilirubin, Direct 0.6 (*) 0.0 - 0.3 mg/dL   Indirect Bilirubin 0.4  0.3 - 0.9 mg/dL    Studies/Results: Attempted colonoscopy: With poor prep, limited exam from rectum to descending colon unremarkable   Assessment: 1. Acute hepatitis with positive hepatitis C RNA and heavy alcohol use as well as polysubstance abuse. 1. Anti-smooth muscle and antimitochondrial antibody pending. Iron saturation is a little high which is nonspecific. No G. spike an SPEP 2. Family history of colon cancer in a first-degree relative with limited unsuccessful colonoscopy today. Plan: 1. Await pending hepatic studies. Especially quantitative RNA and anti-smooth muscle antibody 2. Gen. supportive care anddetox as necessary 3. Would defer further: Imaging till acute illness resolved. 4.     Miyoko Hashimi C 09/11/2012, 1:27 PM

## 2012-09-11 NOTE — Progress Notes (Signed)
Pt has not had any result of bowel prep. Ilean Skill LPN

## 2012-09-11 NOTE — Progress Notes (Signed)
hospitalist on call notified that pt has had no BM since her GI prep no new orders.

## 2012-09-12 LAB — CREATININE, SERUM
Creatinine, Ser: 0.94 mg/dL (ref 0.50–1.10)
GFR calc Af Amer: 87 mL/min — ABNORMAL LOW (ref >90)
GFR calc non Af Amer: 75 mL/min — ABNORMAL LOW (ref >90)

## 2012-09-12 LAB — HEPATIC FUNCTION PANEL
AST: 1001 U/L — ABNORMAL HIGH (ref 0–37)
Bilirubin, Direct: 0.6 mg/dL — ABNORMAL HIGH (ref 0.0–0.3)
Indirect Bilirubin: 0.2 mg/dL — ABNORMAL LOW (ref 0.3–0.9)
Total Bilirubin: 0.8 mg/dL (ref 0.3–1.2)

## 2012-09-12 NOTE — Progress Notes (Signed)
[facility] Progress Note  Subjective: Patient asleep. Events of last night noted  Objective: Vital signs in last 24 hours: Temp:  [98.3 F (36.8 C)-98.7 F (37.1 C)] 98.7 F (37.1 C) (12/15 0629) Pulse Rate:  [65-88] 78  (12/15 0629) Resp:  [11-26] 19  (12/15 0629) BP: (82-121)/(52-76) 121/65 mmHg (12/15 0629) SpO2:  [90 %-100 %] 96 % (12/15 0629) Weight change:    PE: Not examined today  Lab Results: Results for orders placed during the hospital encounter of 09/02/12 (from the past 24 hour(s))  CREATININE, SERUM     Status: Abnormal   Collection Time   09/12/12  7:00 AM      Component Value Range   Creatinine, Ser 0.94  0.50 - 1.10 mg/dL   GFR calc non Af Amer 75 (*) >90 mL/min   GFR calc Af Amer 87 (*) >90 mL/min  HEPATIC FUNCTION PANEL     Status: Abnormal   Collection Time   09/12/12  7:00 AM      Component Value Range   Total Protein 6.5  6.0 - 8.3 g/dL   Albumin 3.2 (*) 3.5 - 5.2 g/dL   AST 1478 (*) 0 - 37 U/L   ALT 1302 (*) 0 - 35 U/L   Alkaline Phosphatase 257 (*) 39 - 117 U/L   Total Bilirubin 0.8  0.3 - 1.2 mg/dL   Bilirubin, Direct 0.6 (*) 0.0 - 0.3 mg/dL   Indirect Bilirubin 0.2 (*) 0.3 - 0.9 mg/dL    Studies/Results: Dg Foot Complete Right  09/11/2012  *RADIOLOGY REPORT*  Clinical Data: Pain and swelling at site of open reduction and fixation.  RIGHT FOOT COMPLETE - 3+ VIEW  Comparison: 04/02/2012  Findings: Prior open reduction internal fixation across the first metatarsal and medial cuneiform.  Increasing lucency surrounding the plate and screw construct particularly proximally. The findings appear worse compared with July.  Marked dorsal soft tissue swelling.  Healed fracture across the base of the second metatarsal.  IMPRESSION: Increasing lucency surrounding the hardware associated with ORIF medial cuneiform and first metatarsal.  Superimposed infection not excluded.  This area would be suboptimally evaluated with MR due to susceptibility  artifact.  Tissue sampling may be warranted.   Original Report Authenticated By: Davonna Belling, M.D.       Assessment: 1. Acute hepatitis, positive for hepatitis C possible multifactorial with alcohol and or autoimmune hepatitis, less likely hemachromatosis, the confirmatory studies pending 2. Polysubstance abuse 3. Family history of colon cancer with unsuccessful colonoscopy yesterday due to poor prep  Plan: Await pending labs work If Autoimmune hepatitis suggested consider steroids but conjunction with treatment of hepatitis C, otherwise abstinence from alcohol. Consider liver biopsy but doubt it will be helpful in distinguishing various etiologies of her elevated liver function tests Diet diet as tolerated, detox as indicated Barbara Key C 09/12/2012, 9:35 AM

## 2012-09-12 NOTE — Progress Notes (Signed)
[facility] PROGRESS NOTE  Barbara Key:[id number] DOB: 04/19/1971 DOA: 09/02/2012 PCP: Dorrene German, MD  Assessment/Plan: Acute hepatis acute hep c vs autoimmune disease- does have a history of IV drug abuse- ?whether or not Hep c is acute, appreciate GI consult; ANA negative, S pep ok and smooth muscle ab elevated so very well may be multifactorial, ultrasound ok- outpatient hep c treatment at [facility]- hope for d/c in AM- await hep c quantitative RNA   polysubstance abuse- appreciate psych- no active SI- sitter d/c'd, patient has capacity  Hypothyroid- synthroid resumed as patient has been off for 6 months  Chronic pain- methadone changed to oxy IR  Alcohol abuse- CIWA  Tobacco abuse- nicotine patch  UTI- culture negative- d/c rocehpin  Constipation- colase, add lactlose and enema- due to pain medication/hypothryroidism- for colonoscopy  Foot swelling- will need to follow up as an outpatient with dr who did surgery to see if biopsy needs to be done, no fever, no sign of systemic infectio   Code Status: full Family Communication: patient at bedside Disposition Plan: d/c in AM   Consultants:  GI  Procedures:    Antibiotics:    HPI/Subjective: Still no BM C/o swelling in right foot- where surgery had been done  Objective: Filed Vitals:   09/11/12 1551 09/11/12 2140 09/11/12 2257 09/12/12 0629  BP: 92/60 102/76 96/60 121/65  Pulse:  88  78  Temp:  98.3 F (36.8 C)  98.7 F (37.1 C)  TempSrc:  Oral  Oral  Resp:  20  19  Height:      Weight:      SpO2:  93%  96%    Intake/Output Summary (Last 24 hours) at 09/12/12 1039 Last data filed at 09/11/12 2333  Gross per 24 hour  Intake    500 ml  Output      2 ml  Net    498 ml   Filed Weights   09/08/12 1433  Weight: 74.8 kg (164 lb 14.5 oz)    Exam:   General:  A+Ox3, NAD  Cardiovascular: rrr  Respiratory: clear, no wheezing  Abdomen: +BS, soft, NT, could  not palpate liver  Data Reviewed: Basic Metabolic Panel:  Lab 09/12/12 8119 09/10/12 0555 09/09/12 0530 09/06/12 0505 09/05/12 1222  NA -- 135 139 138 137  K -- 4.0 4.2 3.8 4.3  CL -- 96 102 103 99  CO2 -- 29 28 26 27   GLUCOSE -- 117* 112* 93 88  BUN -- 7 10 14 17   CREATININE 0.94 0.90 0.98 1.12* 1.32*  CALCIUM -- 9.2 9.1 8.8 9.9  MG -- -- -- -- --  PHOS -- -- -- -- --   Liver Function Tests:  Lab 09/12/12 0700 09/11/12 0615 09/10/12 0555 09/09/12 0530 09/08/12 0540  AST 1001* 971* 1156* 1253* 1192*  ALT 1302* 1379* 1569* 1629* 1566*  ALKPHOS 257* 226* 246* 243* 219*  BILITOT 0.8 1.0 0.8 0.6 0.6  PROT 6.5 6.5 6.8 6.5 6.6  ALBUMIN 3.2* 3.3* 3.5 3.3* 3.4*   No results found for this basename: LIPASE:5,AMYLASE:5 in the last 168 hours No results found for this basename: AMMONIA:5 in the last 168 hours CBC:  Lab 09/09/12 0530 09/06/12 0505 09/05/12 1437  WBC 4.1 5.1 6.7  NEUTROABS -- -- --  HGB 12.4 14.7 15.1*  HCT 38.3 45.0 44.6  MCV 97.5 94.9 93.9  PLT 186 213 216   Cardiac Enzymes: No results found for this basename: CKTOTAL:5,CKMB:5,CKMBINDEX:5,TROPONINI:5 in the  last 168 hours BNP (last 3 results) No results found for this basename: PROBNP:3 in the last 8760 hours CBG: No results found for this basename: GLUCAP:5 in the last 168 hours  Recent Results (from the past 240 hour(s))  URINE CULTURE     Status: Normal   Collection Time   09/06/12  6:02 AM      Component Value Range Status Comment   Specimen Description URINE, RANDOM   Final    Special Requests ADDED 0631   Final    Culture  Setup Time 09/06/2012 06:32   Final    Colony Count 4,000 COLONIES/ML   Final    Culture INSIGNIFICANT GROWTH   Final    Report Status 09/07/2012 FINAL   Final   URINE CULTURE     Status: Normal   Collection Time   09/06/12 10:49 AM      Component Value Range Status Comment   Specimen Description URINE, RANDOM   Final    Special Requests NONE   Final    Culture  Setup Time  09/06/2012 11:30   Final    Colony Count NO GROWTH   Final    Culture NO GROWTH   Final    Report Status 09/07/2012 FINAL   Final      Studies: Dg Foot Complete Right  09-14-12  *RADIOLOGY REPORT*  Clinical Data: Pain and swelling at site of open reduction and fixation.  RIGHT FOOT COMPLETE - 3+ VIEW  Comparison: 04/02/2012  Findings: Prior open reduction internal fixation across the first metatarsal and medial cuneiform.  Increasing lucency surrounding the plate and screw construct particularly proximally. The findings appear worse compared with July.  Marked dorsal soft tissue swelling.  Healed fracture across the base of the second metatarsal.  IMPRESSION: Increasing lucency surrounding the hardware associated with ORIF medial cuneiform and first metatarsal.  Superimposed infection not excluded.  This area would be suboptimally evaluated with MR due to susceptibility artifact.  Tissue sampling may be warranted.   Original Report Authenticated By: Davonna Belling, M.D.     Scheduled Meds:    . busPIRone  15 mg Oral TID  . cloNIDine  0.1 mg Oral TID  . DULoxetine  30 mg Oral Daily  . enoxaparin (LOVENOX) injection  40 mg Subcutaneous Q24H  . folic acid  1 mg Oral Daily  . gabapentin  400 mg Oral TID  . levothyroxine  100 mcg Oral QAC breakfast  . lidocaine  2 patch Transdermal Q24H  . multivitamin with minerals  1 tablet Oral Daily  . nicotine  21 mg Transdermal Daily  . polyethylene glycol  17 g Oral TID  . risperiDONE  2 mg Oral QHS  . senna-docusate  1 tablet Oral BID  . thiamine  100 mg Oral Daily  . traZODone  50 mg Oral QHS   Continuous Infusions:   Principal Problem:  *Acute hepatitis Active Problems:  Polysubstance dependence including opioid type drug, episodic abuse  Hypothyroid  Suicidal ideation  Constipation    Time spent: 35 min    Meshach Perry  [facility] Pager [phone]. If 8PM-8AM, please contact night-coverage at [email or web address], password  [facility] 09/12/2012, 10:39 AM  LOS: 10 days

## 2012-09-12 NOTE — Progress Notes (Signed)
Patient informed about the importance of sleeping in her hospital bed with the bed alarm due to unwitnessed fall and safety concerns. Patient refused bed alarm and sleeping in her hospital bed. Patient found outside the unit in the hallway bathroom trying to smoke and was told that is against hospital policy and was asked not to do so. Patient showed nurse lighter and stated  "I was going to smoke in my bathroom". Will continue to monitor situation.

## 2012-09-13 ENCOUNTER — Encounter (HOSPITAL_COMMUNITY): Payer: Self-pay | Admitting: Gastroenterology

## 2012-09-13 DIAGNOSIS — M79671 Pain in right foot: Secondary | ICD-10-CM

## 2012-09-13 LAB — HEPATIC FUNCTION PANEL
AST: 858 U/L — ABNORMAL HIGH (ref 0–37)
Albumin: 3.2 g/dL — ABNORMAL LOW (ref 3.5–5.2)
Total Protein: 6.6 g/dL (ref 6.0–8.3)

## 2012-09-13 LAB — HCV RNA QUANT RFLX ULTRA OR GENOTYP: HCV Quantitative Log: 6.55 {Log} — ABNORMAL HIGH (ref <1.18)

## 2012-09-13 LAB — MITOCHONDRIAL ANTIBODIES: Mitochondrial M2 Ab, IgG: 0.37 (ref <0.91)

## 2012-09-13 MED ORDER — RISPERIDONE 2 MG PO TABS: 2.0000 mg | ORAL_TABLET | Freq: Every day | ORAL | Status: DC

## 2012-09-13 MED ORDER — BUSPIRONE HCL 15 MG PO TABS: 15.0000 mg | ORAL_TABLET | Freq: Three times a day (TID) | ORAL | Status: DC

## 2012-09-13 MED ORDER — DULOXETINE HCL 30 MG PO CPEP: 30.0000 mg | ORAL_CAPSULE | Freq: Every day | ORAL | Status: DC

## 2012-09-13 MED ORDER — TRAZODONE HCL 50 MG PO TABS: 50.0000 mg | ORAL_TABLET | Freq: Every day | ORAL | Status: DC

## 2012-09-13 MED ORDER — LIDOCAINE 5 % EX PTCH: 2.0000 | MEDICATED_PATCH | CUTANEOUS | Status: DC

## 2012-09-13 MED ORDER — OXYCODONE HCL 5 MG PO TABS: 5.0000 mg | ORAL_TABLET | ORAL | Status: DC | PRN

## 2012-09-13 MED ORDER — GABAPENTIN 400 MG PO CAPS: 400.0000 mg | ORAL_CAPSULE | Freq: Three times a day (TID) | ORAL | Status: DC

## 2012-09-13 MED ORDER — POLYETHYLENE GLYCOL 3350 17 G PO PACK: 17.0000 g | PACK | Freq: Three times a day (TID) | ORAL | Status: DC

## 2012-09-13 MED ORDER — NICOTINE 21 MG/24HR TD PT24: 1.0000 | MEDICATED_PATCH | Freq: Every day | TRANSDERMAL | Status: DC

## 2012-09-13 MED ORDER — MAGNESIUM CITRATE PO SOLN
1.0000 | Freq: Once | ORAL | Status: AC
  Administered 2012-09-13: 1 via ORAL
  Filled 2012-09-13: qty 296

## 2012-09-13 MED ORDER — CLONIDINE HCL 0.1 MG PO TABS: 0.1000 mg | ORAL_TABLET | Freq: Three times a day (TID) | ORAL | Status: DC

## 2012-09-13 MED ORDER — CLONIDINE HCL 0.1 MG PO TABS: 0.1000 mg | ORAL_TABLET | Freq: Two times a day (BID) | ORAL | Status: DC

## 2012-09-13 MED ORDER — DOCUSATE SODIUM 250 MG PO CAPS: 250.0000 mg | ORAL_CAPSULE | Freq: Two times a day (BID) | ORAL | Status: DC

## 2012-09-13 MED ORDER — LEVOTHYROXINE SODIUM 100 MCG PO TABS: 100.0000 ug | ORAL_TABLET | Freq: Every day | ORAL | Status: DC

## 2012-09-13 NOTE — Discharge Summary (Signed)
Physician Discharge Summary  Barbara Key ZOX:[id number] DOB: 1971/05/31 DOA: 09/02/2012  PCP: Dorrene German, MD  Admit date: 09/02/2012 Discharge date: 09/13/2012  Time spent: 45 minutes  Recommendations for Outpatient Follow-up:    Hepatitis C follow up at the [facility] on [address].  Call [phone] for an appointment.  Gastroenterology follow up for colonoscopy.  Call to schedule an appointment with Dr. Carman Ching. [phone]  Follow up with Primary Care Physician in 2-3 weeks.  Please check Thyroid panel.  Polysubstance and psychiatric follow up will be cared for at [facility].  [phone].  Patient to call for an appointment.  Follow up with [facility] [place])  Regarding right foot pain.  Discharge Diagnoses:  Principal Problem:  *Acute hepatitis Active Problems:  Polysubstance dependence including opioid type drug, episodic abuse  Hypothyroid  Suicidal ideation  Constipation  Right foot pain   Discharge Condition: Stable, no longer in pain, much improved.  Diet recommendation: Heart healthy  Filed Weights   09/08/12 1433  Weight: 74.8 kg (164 lb 14.5 oz)    History of present illness:   CHIEF COMPLAINT:  Asked by ED M.D.-to evaluate for acute hepatitis  Patient initially presented to the ED on 12/5 for back pain/chest pain, she is an active IV heroine and crack cocaine user and expressed suicidal thinking.   HPI:  Patient is a 41 year old Caucasian female with a past medical history of hypothyroidism-noncompliant to Synthroid for the past 5-6 months, history of polysubstance abuse, drug induced mood disorder who presented to the ED with chest pain. She is an active IVDA with heroin and crack cocaine, and during her evaluation in the ED expressed suicidal ideations. She was observed in the ED andthought to need inpatient detoxification program. Routine labs were drawn and it  showed significant transaminitis, these were followed and they started worsening and I was subsequently asked to admit this patient for further evaluation and treatment. During my evaluation , patient appears very tearful and complains of generalized aches and pains. She denies any fever. Her last use of illicit drugs was a day or so prior to presentation to the ED . Apart from doing IV heroine and crack cocaine, she also has a fairly significant amount of almost daily alcohol intake which she is very vague about, describes it around one pint of liquor almost every day or so. She also claims to take Percocet and Vicodin intermittently, upon repeatedly asking she claims that her last use of these medications were then 2 weeks ago. Patient claims that she has 2 kids, one of them lives with the father and the other lives with her ex-boyfriend. She was living with a drug dealer who now is in prison. She has been sexually active with numerous persons.    Hospital Course:   Acute hepatis acute hep c  The patient's AST and ALT peaked at 1367, and 1713 respectively. At the time of discharge they have decreased to an AST of 858 and an ALT of 1181.  Gastroenterology was consulted. The patient has a history of IV drug abuse as well as alcohol use, and multiple sex partners.  ANA negative, S pep ok and smooth muscle ab elevated so very well may be multifactorial.  Her ultrasound showed only possible hepatic steatosis no masses, no masses or other abnormalities.  Her hepatitis C quantitative RNA is still pending.  Followup and treatment will be done  At the [facility]  system liver care mobile office here in [facility].   Polysubstance abuse The patient was counseled on multiple occasions to refrain from IV drug use, alcohol and tobacco as all 3 of these will worsen her health and specifically her liver disease. She was placed on clonidine and see what protocol to help reduce withdrawal symptoms.   Psychiatry consulted.  They prescribed Cymbalta, BuSpar, and risperdal. She appears much improved. The patient is scheduled for followup at the [facility] outpatient intensive treatment program she has already filed paperwork and received a scholarship their care.    Hypothyroid The patient had stopped taking her Synthroid. The patient's TSH on December 9 was 145. She was started on 100 mcg of IV Synthroid.  After several days which is no longer vomiting she was transitioned to by mouth Synthroid. She was counseled on the importance of taking her Synthroid daily, and she will followup with her primary care physician to have her thyroid panel checked in several weeks.    Chronic pain The patient complains of chronic lower back pain. Initially this was 6 treated with supportive measures such as heat and BenGay.  When these were completely ineffective she was started on low-dose methadone.  The methadone has been changed to oxy IR.  Her pain is now controlled. She will be discharged with a limited prescription for OxyIR 5 mg, 30 tablets. She understands that she will need to followup with the pain management clinic.  Alcohol abuse The patient reported drinking a large amount of alcohol prior to admission. She was monitored on the CIWA protocol. And has now passed through any withdrawal symptoms. The patient understands that given her acute liver disease she should not drink alcohol as an outpatient.  Tobacco abuse nicotine patch in the hospital.  She will be discharged with a prescription for nicotine transdermal patches. She understands that if she must smoke she needs to remove the patch.   UTI Initially the patient's urine appeared positive for infection. However, the culture was negative. She had been started on IV Rocephin which was subsequently discontinued when her cultures were negative.  Constipation Likely secondary to hypothyroidism and psychotropic/narcotic medications. Treated with stool  softeners, enemas, and laxatives. The patient actually drank a bowel prep prepare for colonoscopy. But still had difficulties with constipation. She will need an ongoing outpatient bowel regimen.  Family history of a first degree relative with colon cancer. The patient was seen by gastroenterology who attempted to perform a colonoscopy. However, the bowel prep was unsuccessful in cleaning her out. She will follow up as an outpatient for colonoscopy.  Foot swelling- will need to follow up as an outpatient with dr who did surgery to see if biopsy needs to be done, no fever, elevated white count or signs of systemic infection.     Consultations:  Psychiatry  Gastroenterology  Discharge Exam: Filed Vitals:   09/12/12 0629 09/12/12 1409 09/12/12 2212 09/13/12 0600  BP: 121/65 115/80 108/76   Pulse: 78 73  95  Temp: 98.7 F (37.1 C) 98.6 F (37 C)  98 F (36.7 C)  TempSrc: Oral Oral    Resp: 19 18  17   Height:      Weight:      SpO2: 96% 98%  98%    General: A&O, Pleasant, appears much improved. Cardiovascular: RRR, No M/R/G, No lower extremity swelling  Respiratory: no w/c/r, no accessory muscle use Abdomen:  Soft, nt, nd, Bs, no masses Lower ext:  No swelling or  erythema in right foot. Psych:  Calm, A&O, NAD, Cooperative, pleasant, well groomed.  Discharge Instructions      Discharge Orders    Future Orders Please Complete By Expires   Diet - low sodium heart healthy      Increase activity slowly          Medication List     As of 09/13/2012 10:43 AM    STOP taking these medications         QUEtiapine 25 MG tablet   Commonly known as: SEROQUEL      TAKE these medications         busPIRone 15 MG tablet   Commonly known as: BUSPAR   Take 1 tablet (15 mg total) by mouth 3 (three) times daily.      cloNIDine 0.1 MG tablet   Commonly known as: CATAPRES   Take 1 tablet (0.1 mg total) by mouth 2 (two) times daily. TAPER:  Take twice daily for 3 days, and then  once daily for 3 days then stop.      docusate sodium 250 MG capsule   Commonly known as: COLACE   Take 1 capsule (250 mg total) by mouth 2 (two) times daily.      DULoxetine 30 MG capsule   Commonly known as: CYMBALTA   Take 1 capsule (30 mg total) by mouth daily.      gabapentin 400 MG capsule   Commonly known as: NEURONTIN   Take 1 capsule (400 mg total) by mouth 3 (three) times daily.      levothyroxine 100 MCG tablet   Commonly known as: SYNTHROID, LEVOTHROID   Take 1 tablet (100 mcg total) by mouth daily before breakfast.      lidocaine 5 %   Commonly known as: LIDODERM   Place 2 patches onto the skin daily. Remove & Discard patch within 12 hours or as directed by MD      nicotine 21 mg/24hr patch   Commonly known as: NICODERM CQ - dosed in mg/24 hours   Place 1 patch onto the skin daily. Absolutely do not smoke with the patch on.  If you must smoke please take the patch off.      oxyCODONE 5 MG immediate release tablet   Commonly known as: Oxy IR/ROXICODONE   Take 1 tablet (5 mg total) by mouth every 4 (four) hours as needed.      polyethylene glycol packet   Commonly known as: MIRALAX / GLYCOLAX   Take 17 g by mouth 3 (three) times daily.      risperiDONE 2 MG tablet   Commonly known as: RISPERDAL   Take 1 tablet (2 mg total) by mouth at bedtime.      traZODone 50 MG tablet   Commonly known as: DESYREL   Take 1 tablet (50 mg total) by mouth at bedtime.         Follow-up Information    Call [facility]. (Intensive Outpatient Therapy for Substance Abuse)    Contact information:   [address] [place], [place] [address] Call [phone] to schedule intake appointment  **8 week program that meets Monday, Wednesdays, Fridays from [employer]. (To schedule appointment for medication)    Contact information:   [address], [place], [place] [address] [phone]   [facility]esta [facility]er - For help with Medications.      Call  [facility]. (For Treatment of Acute  Hep C)    Contact information:   [facility] [address], [address] [place], [address]         Follow up with EDWARDS JR,JAMES L, MD. Call in 2 weeks. (for colonoscopy follow up.)    Contact information:   [facility] [address] [place] [address] [phone]       Follow up with VITO,GEORGE, DPM. Schedule an appointment as soon as possible for a visit in 1 week. (Regarding possible infection in Right Foot.)    Contact information:   [address] [place] [address] [phone]           The results of significant diagnostics from this hospitalization (including imaging, microbiology, ancillary and laboratory) are listed below for reference.    Significant Diagnostic Studies: US Abdomen Complete  09/06/2012  *RADIOLOGY REPORT*  Clinical Data:  Abnormal LFTs, hepatitis  COMPLETE ABDOMINAL ULTRASOUND  Comparison:  None.  Findings:  Gallbladder:  No gallstones, gallbladder wall thickening, or pericholecystic fluid.  Negative sonographic Murphy's sign.  Common bile duct:  Measures 5 mm.  Liver:  No focal lesion identified.  Mildly increased parenchymal echogenicity, suggesting hepatic steatosis.  IVC:  Appears normal.  Pancreas:  Visualized portions are grossly unremarkable.  Spleen:  Measures 5.1 cm.  Right Kidney:  Measures 13.5 cm.  No mass or hydronephrosis.  Left Kidney:  Measures 12.3 cm.  No mass or hydronephrosis.  Abdominal aorta:  No aneurysm identified.  IMPRESSION: Possible hepatic steatosis.  Otherwise negative abdominal ultrasound.   Original Report Authenticated By: Charline Bills, M.D.    Dg Foot Complete Right  09/11/2012  *RADIOLOGY REPORT*  Clinical Data: Pain and swelling at site of open reduction and fixation.  RIGHT FOOT COMPLETE - 3+ VIEW  Comparison: 04/02/2012  Findings: Prior open reduction internal fixation across the  first metatarsal and medial cuneiform.  Increasing lucency surrounding the plate and screw construct particularly proximally. The findings appear worse compared with July.  Marked dorsal soft tissue swelling.  Healed fracture across the base of the second metatarsal.  IMPRESSION: Increasing lucency surrounding the hardware associated with ORIF medial cuneiform and first metatarsal.  Superimposed infection not excluded.  This area would be suboptimally evaluated with MR due to susceptibility artifact.  Tissue sampling may be warranted.   Original Report Authenticated By: Davonna Belling, M.D.     Microbiology: Recent Results (from the past 240 hour(s))  URINE CULTURE     Status: Normal   Collection Time   09/06/12  6:02 AM      Component Value Range Status Comment   Specimen Description URINE, RANDOM   Final    Special Requests ADDED 0631   Final    Culture  Setup Time 09/06/2012 06:32   Final    Colony Count 4,000 COLONIES/ML   Final    Culture INSIGNIFICANT GROWTH   Final    Report Status 09/07/2012 FINAL   Final   URINE CULTURE     Status: Normal   Collection Time   09/06/12 10:49 AM      Component Value Range Status Comment   Specimen Description URINE, RANDOM   Final    Special Requests NONE   Final    Culture  Setup Time 09/06/2012 11:30   Final    Colony Count NO GROWTH   Final    Culture NO GROWTH   Final    Report Status 09/07/2012 FINAL   Final  Labs: Basic Metabolic Panel:  Lab 09/12/12 1610 09/10/12 0555 09/09/12 0530  NA -- 135 139  K -- 4.0 4.2  CL -- 96 102  CO2 -- 29 28  GLUCOSE -- 117* 112*  BUN -- 7 10  CREATININE 0.94 0.90 0.98  CALCIUM -- 9.2 9.1  MG -- -- --  PHOS -- -- --   Liver Function Tests:  Lab 09/13/12 0749 09/12/12 0700 09/11/12 0615 09/10/12 0555 09/09/12 0530  AST 858* 1001* 971* 1156* 1253*  ALT 1181* 1302* 1379* 1569* 1629*  ALKPHOS 264* 257* 226* 246* 243*  BILITOT 0.8 0.8 1.0 0.8 0.6  PROT 6.6 6.5 6.5 6.8 6.5  ALBUMIN 3.2* 3.2* 3.3*  3.5 3.3*   CBC:  Lab 09/09/12 0530  WBC 4.1  NEUTROABS --  HGB 12.4  HCT 38.3  MCV 97.5  PLT 186       SignedConley Canal [facility] [phone] 09/13/2012, 10:43 AM

## 2012-09-13 NOTE — Progress Notes (Signed)
[facility] "boyfriend". Medications and instructions reviewed with patient with all questions answered. IV d/c'd with cath intact and dressing CDI. Assessment unchanged from this am. Patient has all follow up appointment information. RN spoke with patient about feeling safe upon discharge and patient stated that she "felt safe and would be fine and would make good choices".

## 2012-09-13 NOTE — Discharge Summary (Signed)
Patient seen and examined by me.  Plan to d/c patient.  Patient has high likelihood of relapsing.  Has arranged for outpatient follow up and instructed how important it is for follow up for treatment  Gabbriella Presswood DO

## 2012-09-13 NOTE — Op Note (Signed)
[facility] [address] [place], [address]   COLONOSCOPY PROCEDURE REPORT  PATIENT: Barbara Key, Barbara Key  MR#: [id number] BIRTHDATE: November 29, 1970 , 41  yrs. old GENDER: Female ENDOSCOPIST: Dorena Cookey, MD REFERRED BY: PROCEDURE DATE:  09/11/2012 PROCEDURE: ASA CLASS: INDICATIONS:  family history of colon cancer in a first degree relative MEDICATIONS: Benadryl 50 mcg, fentanyl 50 mcg, Versed 2 mg.  DESCRIPTION OF PROCEDURE: the Pentax videocolonoscope was inserted in to the rectum and advanced with difficulty as far as possible ultimately to about 60 cm. The prep was poor throughout. There was semisolid and viscous nontransparent liquid stool in the majority of the areas examined as well as some particulate stool which impeded suctioning and lavage. I was able to see some representative areas from the rectum to about the mid descending colon but not all areas. The mucosa where visualized was normal with no masses polyps diverticula or other mucosal abnormalities.the scope was then withdrawn and the patient returned to the recovery room in stable condition. She tolerated the procedure well and there were no immediate complications    COMPLICATIONS: None  ENDOSCOPIC IMPRESSION:sigmoidoscopy exam and tended as colonoscopy to about 60 cm, normal mucosa where visualized but limited by poor prep.  RECOMMENDATIONS:I would defer any completion of colon screening until after her acute hepatitis has further resolved. Hopefully we can also obtain records of her previous colonoscopy which she thinks was done about 3 years ago with nopersonal history of polyps. _______________________________ Rosalie DoctorDorena Cookey, MD 09/11/2012 1:23 PM

## 2012-09-13 NOTE — Progress Notes (Signed)
Patient's son came to visit with a friend. Patient and son talking with raised voices in the hallway. Son was told to leave by patient. Son and friend left unit but came back to speak with charge RN about concerns of mother having a safe discharge and patient and son resumed arguing. Patient requested for son to leave and security to be called. RN's spoke with patient's son and friend, as well as security and listened to son's concerns and informed him that patient felt safe being discharged. Son and friend left on free will.

## 2012-11-24 ENCOUNTER — Encounter (HOSPITAL_COMMUNITY): Payer: Self-pay | Admitting: Emergency Medicine

## 2012-11-24 ENCOUNTER — Emergency Department (HOSPITAL_COMMUNITY)
Admission: EM | Admit: 2012-11-24 | Discharge: 2012-11-25 | Disposition: A | Discharge status: Other Acute Inpatient Hospital | Payer: Self-pay | Attending: Emergency Medicine | Admitting: Emergency Medicine

## 2012-11-24 DIAGNOSIS — F1120 Opioid dependence, uncomplicated: Secondary | ICD-10-CM

## 2012-11-24 DIAGNOSIS — F1994 Other psychoactive substance use, unspecified with psychoactive substance-induced mood disorder: Secondary | ICD-10-CM | POA: Insufficient documentation

## 2012-11-24 DIAGNOSIS — Z8619 Personal history of other infectious and parasitic diseases: Secondary | ICD-10-CM | POA: Insufficient documentation

## 2012-11-24 DIAGNOSIS — Z79899 Other long term (current) drug therapy: Secondary | ICD-10-CM | POA: Insufficient documentation

## 2012-11-24 DIAGNOSIS — R45851 Suicidal ideations: Secondary | ICD-10-CM | POA: Insufficient documentation

## 2012-11-24 DIAGNOSIS — Z3202 Encounter for pregnancy test, result negative: Secondary | ICD-10-CM | POA: Insufficient documentation

## 2012-11-24 DIAGNOSIS — E038 Other specified hypothyroidism: Secondary | ICD-10-CM | POA: Insufficient documentation

## 2012-11-24 DIAGNOSIS — F17200 Nicotine dependence, unspecified, uncomplicated: Secondary | ICD-10-CM | POA: Insufficient documentation

## 2012-11-24 DIAGNOSIS — Z8669 Personal history of other diseases of the nervous system and sense organs: Secondary | ICD-10-CM | POA: Insufficient documentation

## 2012-11-24 DIAGNOSIS — F1920 Other psychoactive substance dependence, uncomplicated: Secondary | ICD-10-CM | POA: Insufficient documentation

## 2012-11-24 HISTORY — DX: Unspecified viral hepatitis C without hepatic coma: B19.20

## 2012-11-24 LAB — COMPREHENSIVE METABOLIC PANEL
Albumin: 3.9 g/dL (ref 3.5–5.2)
BUN: 6 mg/dL (ref 6–23)
Creatinine, Ser: 0.95 mg/dL (ref 0.50–1.10)
Potassium: 4.3 mEq/L (ref 3.5–5.1)
Total Protein: 8.3 g/dL (ref 6.0–8.3)

## 2012-11-24 LAB — CBC WITH DIFFERENTIAL/PLATELET
Basophils Relative: 1 % (ref 0–1)
Eosinophils Absolute: 0.3 10*3/uL (ref 0.0–0.7)
Hemoglobin: 15.5 g/dL — ABNORMAL HIGH (ref 12.0–15.0)
MCH: 33 pg (ref 26.0–34.0)
MCHC: 34.1 g/dL (ref 30.0–36.0)
Monocytes Absolute: 0.6 10*3/uL (ref 0.1–1.0)
Monocytes Relative: 10 % (ref 3–12)
Neutrophils Relative %: 50 % (ref 43–77)
RDW: 14.1 % (ref 11.5–15.5)

## 2012-11-24 LAB — RAPID URINE DRUG SCREEN, HOSP PERFORMED
Amphetamines: NOT DETECTED
Cocaine: NOT DETECTED
Opiates: POSITIVE — AB
Tetrahydrocannabinol: POSITIVE — AB

## 2012-11-24 MED ORDER — ALUM & MAG HYDROXIDE-SIMETH 200-200-20 MG/5ML PO SUSP: 30.0000 mL | ORAL | Status: DC | PRN

## 2012-11-24 MED ORDER — LORAZEPAM 1 MG PO TABS
1.0000 mg | ORAL_TABLET | Freq: Three times a day (TID) | ORAL | Status: DC | PRN
  Administered 2012-11-25: 1 mg via ORAL
  Filled 2012-11-24: qty 1

## 2012-11-24 MED ORDER — NICOTINE 21 MG/24HR TD PT24
21.0000 mg | MEDICATED_PATCH | Freq: Every day | TRANSDERMAL | Status: DC
  Administered 2012-11-24 – 2012-11-25 (×2): 21 mg via TRANSDERMAL
  Filled 2012-11-24 (×2): qty 1

## 2012-11-24 MED ORDER — ONDANSETRON HCL 4 MG PO TABS
4.0000 mg | ORAL_TABLET | Freq: Three times a day (TID) | ORAL | Status: DC | PRN
  Administered 2012-11-25: 4 mg via ORAL
  Filled 2012-11-24: qty 1

## 2012-11-24 MED ORDER — ZOLPIDEM TARTRATE 5 MG PO TABS: 5.0000 mg | ORAL_TABLET | Freq: Every evening | ORAL | Status: DC | PRN

## 2012-11-24 MED ORDER — IBUPROFEN 600 MG PO TABS
600.0000 mg | ORAL_TABLET | Freq: Three times a day (TID) | ORAL | Status: DC | PRN
  Administered 2012-11-25: 600 mg via ORAL
  Filled 2012-11-24: qty 1

## 2012-11-24 NOTE — ED Provider Notes (Signed)
History     CSN: [id number]  Arrival date & time 11/24/12  2003   First MD Initiated Contact with Patient 11/24/12 2005      Chief Complaint  Patient presents with  . Medical Clearance    (Consider location/radiation/quality/duration/timing/severity/associated sxs/prior treatment) HPI Comments: 42 year old female presents the emergency department requesting detox from heroin. She was recently hospitalized this past December and diagnosed with hepatitis C, and 13 days later she resumed use of heroin. Last use was at 3:00 this afternoon today. Admits to marijuana use. She had one beer today. Whenever she tries to detox from heroin on her own, she develops "pain all over" and nausea. States she has had a seizure in the past. Admits to suicidal ideations, however did not have a plan. No homicidal ideations. She is tearful in the room and states she has a lot going on in her life right now. She does not believe she can detox on her own as she has tried frequently in the past.  The history is provided by the patient.    Past Medical History  Diagnosis Date  . Thyroid disease   . Polysubstance (excluding opioids) dependence   . Hypothyroidism   . Mental disorder     suicidal Ideation  . Mood disorder, drug-induced   . Radial nerve palsy 03/2012    resolved  . Hepatitis C     Past Surgical History  Procedure Laterality Date  . Foot surgery  3/13; 06/12    to repair a deformity: plate and screws to Rt then Lt foot  . Tubal ligation    . Endometrial ablation    . Flexible sigmoidoscopy  09/11/2012    Procedure: FLEXIBLE SIGMOIDOSCOPY;  Surgeon: Barrie Folk, MD;  Location: [facility] ENDOSCOPY;  Service: Endoscopy;  Laterality: N/A;    Family History  Problem Relation Age of Onset  . Cancer Mother   . Alcohol abuse Father     History  Substance Use Topics  . Smoking status: Current Every Day Smoker -- 1.00 packs/day for 26 years    Types: Cigarettes  . Smokeless tobacco: Never Used   . Alcohol Use: Yes     Comment: former    OB History   Grav Para Term Preterm Abortions TAB SAB Ect Mult Living                  Review of Systems  Gastrointestinal: Positive for nausea.  Psychiatric/Behavioral: Positive for suicidal ideas and dysphoric mood. Negative for self-injury. The patient is nervous/anxious.   All other systems reviewed and are negative.    Allergies  Review of patient's allergies indicates no known allergies.  Home Medications   Current Outpatient Rx  Name  Route  Sig  Dispense  Refill  . ibuprofen (ADVIL,MOTRIN) 200 MG tablet   Oral   Take 1,200 mg by mouth once.         . pantoprazole (PROTONIX) 40 MG tablet   Oral   Take 40 mg by mouth once.         . busPIRone (BUSPAR) 15 MG tablet   Oral   Take 1 tablet (15 mg total) by mouth 3 (three) times daily.   90 tablet   1   . cloNIDine (CATAPRES) 0.1 MG tablet   Oral   Take 1 tablet (0.1 mg total) by mouth 2 (two) times daily. TAPER:  Take twice daily for 3 days, and then once daily for 3 days then stop.   9  tablet   0   . DULoxetine (CYMBALTA) 30 MG capsule   Oral   Take 1 capsule (30 mg total) by mouth daily.   30 capsule   1   . gabapentin (NEURONTIN) 400 MG capsule   Oral   Take 1 capsule (400 mg total) by mouth 3 (three) times daily.   90 capsule   0   . levothyroxine (SYNTHROID, LEVOTHROID) 100 MCG tablet   Oral   Take 1 tablet (100 mcg total) by mouth daily before breakfast.   30 tablet   1   . lidocaine (LIDODERM) 5 %   Transdermal   Place 2 patches onto the skin daily. Remove & Discard patch within 12 hours or as directed by MD   30 patch   0   . nicotine (NICODERM CQ - DOSED IN MG/24 HOURS) 21 mg/24hr patch   Transdermal   Place 1 patch onto the skin daily. Absolutely do not smoke with the patch on.  If you must smoke please take the patch off.   14 patch   0   . oxyCODONE (OXY IR/ROXICODONE) 5 MG immediate release tablet   Oral   Take 1 tablet (5 mg  total) by mouth every 4 (four) hours as needed.   30 tablet   0   . polyethylene glycol (MIRALAX / GLYCOLAX) packet   Oral   Take 17 g by mouth 3 (three) times daily.   14 each      . risperiDONE (RISPERDAL) 2 MG tablet   Oral   Take 1 tablet (2 mg total) by mouth at bedtime.   30 tablet   1   . traZODone (DESYREL) 50 MG tablet   Oral   Take 1 tablet (50 mg total) by mouth at bedtime.   30 tablet   1     BP 115/87  Pulse 78  Temp(Src) 98.2 F (36.8 C) (Oral)  Resp 20  SpO2 98%  Physical Exam  Nursing note and vitals reviewed. Constitutional: She is oriented to person, place, and time. She appears well-developed and well-nourished.  Tearful  HENT:  Head: Normocephalic and atraumatic.  Mouth/Throat: Oropharynx is clear and moist.  Eyes: Conjunctivae and EOM are normal. Pupils are equal, round, and reactive to light. No scleral icterus.  Neck: Normal range of motion. Neck supple. No JVD present.  Cardiovascular: Normal rate, regular rhythm and normal heart sounds.   Pulmonary/Chest: Effort normal and breath sounds normal. No respiratory distress. She has no wheezes.  Abdominal: Soft. Bowel sounds are normal. She exhibits no mass. There is no tenderness.  Musculoskeletal: Normal range of motion. She exhibits no edema.  Neurological: She is alert and oriented to person, place, and time.  Skin: Skin is warm and dry.  Psychiatric: Her speech is normal and behavior is normal. Judgment normal. Her mood appears anxious. She exhibits a depressed mood. She expresses suicidal ideation. She expresses no homicidal ideation. She expresses no suicidal plans.    ED Course  Procedures (including critical care time)  Labs Reviewed  CBC WITH DIFFERENTIAL  COMPREHENSIVE METABOLIC PANEL  URINE RAPID DRUG SCREEN (HOSP PERFORMED)  ETHANOL   No results found.   No diagnosis found.    MDM  Patient requesting heroin detox. Currently high on heroine. Last in hospital in December  2013. She is expressing SI. Awaiting ACT team consult.  8:49 PM Spoke with ACT team who will evaluate patient.        Trevor Mace, PA-C  11/25/12 0036 

## 2012-11-24 NOTE — ED Notes (Signed)
Pt given blue scrubs and instructions. Pt is aware of the need for urine but states she is unable at this time. Water given

## 2012-11-24 NOTE — ED Notes (Signed)
Knife turned over to security and locked up

## 2012-11-24 NOTE — ED Notes (Signed)
Pt transferred from triage, presents for Detox from Heroin, last used at 3pm today with 1 beer.  Denies SI or HI, no AV hallucinations.  Feeling hopeless.  Admits to hx of Depression & Anxiety.  Pt reports recently diag with Hep. C.  States she has screws & plates in her feet, causing her pain & diff. In walking.  Pt calm & cooperative at present.  No distress noted.

## 2012-11-24 NOTE — ED Notes (Signed)
Pt states this past dec she was diagnosed with hep c  Pt states when she got out of the hospital she resumed use of heroin  Pt states she uses daily  Pt states she has tried to stop and is unable to do so on her own  Pt states she is having abd pain and pain in her feet  Pt states she has screws and plates in her feet and hurts to bad to walk if she doesn't use  Pt states she has general pain all over   Pt state she has not been on her thyroid medicine in the past 2 mths

## 2012-11-25 ENCOUNTER — Inpatient Hospital Stay (HOSPITAL_COMMUNITY)
Admission: AD | Admit: 2012-11-25 | Discharge: 2012-11-30 | DRG: 897 | Disposition: A | Discharge status: Home / Self Care | Payer: Federal, State, Local not specified - Other | Source: Intra-hospital | Attending: Psychiatry | Admitting: Psychiatry

## 2012-11-25 ENCOUNTER — Encounter (HOSPITAL_COMMUNITY): Payer: Self-pay | Admitting: *Deleted

## 2012-11-25 DIAGNOSIS — F1120 Opioid dependence, uncomplicated: Principal | ICD-10-CM

## 2012-11-25 DIAGNOSIS — F1994 Other psychoactive substance use, unspecified with psychoactive substance-induced mood disorder: Secondary | ICD-10-CM

## 2012-11-25 DIAGNOSIS — F1220 Cannabis dependence, uncomplicated: Secondary | ICD-10-CM | POA: Diagnosis present

## 2012-11-25 DIAGNOSIS — F1193 Opioid use, unspecified with withdrawal: Secondary | ICD-10-CM

## 2012-11-25 DIAGNOSIS — I10 Essential (primary) hypertension: Secondary | ICD-10-CM | POA: Diagnosis present

## 2012-11-25 DIAGNOSIS — K5900 Constipation, unspecified: Secondary | ICD-10-CM

## 2012-11-25 DIAGNOSIS — B179 Acute viral hepatitis, unspecified: Secondary | ICD-10-CM

## 2012-11-25 DIAGNOSIS — E039 Hypothyroidism, unspecified: Secondary | ICD-10-CM

## 2012-11-25 DIAGNOSIS — F1123 Opioid dependence with withdrawal: Secondary | ICD-10-CM | POA: Diagnosis present

## 2012-11-25 DIAGNOSIS — Z79899 Other long term (current) drug therapy: Secondary | ICD-10-CM

## 2012-11-25 HISTORY — DX: Essential (primary) hypertension: I10

## 2012-11-25 MED ORDER — DICYCLOMINE HCL 20 MG PO TABS: 20.0000 mg | ORAL_TABLET | Freq: Four times a day (QID) | ORAL | Status: DC | PRN

## 2012-11-25 MED ORDER — BUSPIRONE HCL 10 MG PO TABS
15.0000 mg | ORAL_TABLET | Freq: Three times a day (TID) | ORAL | Status: DC
  Administered 2012-11-26 – 2012-11-30 (×13): 15 mg via ORAL
  Filled 2012-11-25: qty 1
  Filled 2012-11-25 (×2): qty 3
  Filled 2012-11-25: qty 1
  Filled 2012-11-25 (×3): qty 3
  Filled 2012-11-25: qty 1
  Filled 2012-11-25 (×13): qty 3

## 2012-11-25 MED ORDER — TRAZODONE HCL 50 MG PO TABS
50.0000 mg | ORAL_TABLET | Freq: Every evening | ORAL | Status: DC | PRN
  Administered 2012-11-25 – 2012-11-27 (×3): 50 mg via ORAL
  Filled 2012-11-25 (×3): qty 1

## 2012-11-25 MED ORDER — LIDOCAINE 5 % EX PTCH
1.0000 | MEDICATED_PATCH | CUTANEOUS | Status: DC
  Administered 2012-11-25 – 2012-11-29 (×5): 1 via TRANSDERMAL
  Filled 2012-11-25 (×8): qty 1

## 2012-11-25 MED ORDER — ALUM & MAG HYDROXIDE-SIMETH 200-200-20 MG/5ML PO SUSP
30.0000 mL | ORAL | Status: DC | PRN
  Administered 2012-11-27: 30 mL via ORAL

## 2012-11-25 MED ORDER — CLONIDINE HCL 0.1 MG PO TABS
0.1000 mg | ORAL_TABLET | Freq: Once | ORAL | Status: AC
Start: 2012-11-25 — End: 2012-11-25
  Administered 2012-11-25: 0.1 mg via ORAL
  Filled 2012-11-25: qty 1

## 2012-11-25 MED ORDER — ONDANSETRON 4 MG PO TBDP
4.0000 mg | ORAL_TABLET | Freq: Four times a day (QID) | ORAL | Status: DC | PRN
  Administered 2012-11-26 – 2012-11-29 (×8): 4 mg via ORAL

## 2012-11-25 MED ORDER — LOPERAMIDE HCL 2 MG PO CAPS: 2.0000 mg | ORAL_CAPSULE | ORAL | Status: DC | PRN

## 2012-11-25 MED ORDER — METHOCARBAMOL 500 MG PO TABS
500.0000 mg | ORAL_TABLET | Freq: Three times a day (TID) | ORAL | Status: DC | PRN
  Administered 2012-11-25: 500 mg via ORAL
  Filled 2012-11-25: qty 1

## 2012-11-25 MED ORDER — ONDANSETRON 4 MG PO TBDP: 4.0000 mg | ORAL_TABLET | Freq: Four times a day (QID) | ORAL | Status: DC | PRN

## 2012-11-25 MED ORDER — DULOXETINE HCL 30 MG PO CPEP
30.0000 mg | ORAL_CAPSULE | Freq: Every day | ORAL | Status: DC
  Administered 2012-11-25 – 2012-11-30 (×6): 30 mg via ORAL
  Filled 2012-11-25 (×10): qty 1

## 2012-11-25 MED ORDER — NAPROXEN 500 MG PO TABS: 500.0000 mg | ORAL_TABLET | Freq: Two times a day (BID) | ORAL | Status: DC | PRN

## 2012-11-25 MED ORDER — DICYCLOMINE HCL 20 MG PO TABS
20.0000 mg | ORAL_TABLET | Freq: Four times a day (QID) | ORAL | Status: DC | PRN
  Administered 2012-11-26 – 2012-11-29 (×3): 20 mg via ORAL
  Filled 2012-11-25 (×4): qty 1

## 2012-11-25 MED ORDER — CLONIDINE HCL 0.1 MG PO TABS
0.1000 mg | ORAL_TABLET | Freq: Four times a day (QID) | ORAL | Status: AC
  Administered 2012-11-25 – 2012-11-27 (×9): 0.1 mg via ORAL
  Filled 2012-11-25 (×11): qty 1

## 2012-11-25 MED ORDER — LEVOTHYROXINE SODIUM 100 MCG PO TABS
100.0000 ug | ORAL_TABLET | Freq: Every day | ORAL | Status: DC
  Administered 2012-11-26 – 2012-11-30 (×5): 100 ug via ORAL
  Filled 2012-11-25 (×3): qty 1
  Filled 2012-11-25: qty 2
  Filled 2012-11-25 (×4): qty 1

## 2012-11-25 MED ORDER — GABAPENTIN 400 MG PO CAPS
400.0000 mg | ORAL_CAPSULE | Freq: Three times a day (TID) | ORAL | Status: DC
  Administered 2012-11-25 – 2012-11-29 (×10): 400 mg via ORAL
  Filled 2012-11-25 (×15): qty 1

## 2012-11-25 MED ORDER — MAGNESIUM HYDROXIDE 400 MG/5ML PO SUSP: 30.0000 mL | Freq: Every day | ORAL | Status: DC | PRN

## 2012-11-25 MED ORDER — ACETAMINOPHEN 325 MG PO TABS
650.0000 mg | ORAL_TABLET | Freq: Four times a day (QID) | ORAL | Status: DC | PRN
  Administered 2012-11-26 – 2012-11-27 (×4): 650 mg via ORAL

## 2012-11-25 MED ORDER — NICOTINE 21 MG/24HR TD PT24
21.0000 mg | MEDICATED_PATCH | Freq: Every day | TRANSDERMAL | Status: DC
  Administered 2012-11-26 – 2012-11-30 (×5): 21 mg via TRANSDERMAL
  Filled 2012-11-25 (×8): qty 1

## 2012-11-25 MED ORDER — POLYETHYLENE GLYCOL 3350 17 G PO PACK
17.0000 g | PACK | Freq: Every day | ORAL | Status: DC
  Administered 2012-11-27 – 2012-11-30 (×4): 17 g via ORAL
  Filled 2012-11-25 (×8): qty 1

## 2012-11-25 MED ORDER — HYDROXYZINE HCL 25 MG PO TABS
25.0000 mg | ORAL_TABLET | Freq: Four times a day (QID) | ORAL | Status: DC | PRN
  Administered 2012-11-25 – 2012-11-30 (×6): 25 mg via ORAL

## 2012-11-25 MED ORDER — RISPERIDONE 2 MG PO TABS
2.0000 mg | ORAL_TABLET | Freq: Every day | ORAL | Status: DC
  Administered 2012-11-25 – 2012-11-28 (×4): 2 mg via ORAL
  Filled 2012-11-25 (×7): qty 1

## 2012-11-25 MED ORDER — HYDROXYZINE HCL 25 MG PO TABS: 25.0000 mg | ORAL_TABLET | Freq: Four times a day (QID) | ORAL | Status: DC | PRN

## 2012-11-25 MED ORDER — METHOCARBAMOL 500 MG PO TABS
500.0000 mg | ORAL_TABLET | Freq: Three times a day (TID) | ORAL | Status: DC | PRN
  Administered 2012-11-26 – 2012-11-29 (×4): 500 mg via ORAL
  Filled 2012-11-25 (×4): qty 1

## 2012-11-25 MED ORDER — CLONIDINE HCL 0.1 MG PO TABS
0.1000 mg | ORAL_TABLET | Freq: Every day | ORAL | Status: DC
  Administered 2012-11-30: 0.1 mg via ORAL
  Filled 2012-11-25 (×2): qty 1

## 2012-11-25 MED ORDER — CLONIDINE HCL 0.1 MG PO TABS
0.1000 mg | ORAL_TABLET | ORAL | Status: AC
  Administered 2012-11-28 – 2012-11-29 (×4): 0.1 mg via ORAL
  Filled 2012-11-25 (×4): qty 1

## 2012-11-25 MED ORDER — PANTOPRAZOLE SODIUM 40 MG PO TBEC
40.0000 mg | DELAYED_RELEASE_TABLET | Freq: Every day | ORAL | Status: DC
  Administered 2012-11-25 – 2012-11-30 (×6): 40 mg via ORAL
  Filled 2012-11-25 (×9): qty 1

## 2012-11-25 MED ORDER — NAPROXEN 500 MG PO TABS
500.0000 mg | ORAL_TABLET | Freq: Two times a day (BID) | ORAL | Status: DC | PRN
  Administered 2012-11-26 – 2012-11-27 (×3): 500 mg via ORAL
  Filled 2012-11-25 (×4): qty 1

## 2012-11-25 NOTE — [facility] Counselor (Signed)
Patient was declined for admission to [facility] by Spencer Simon, PA due to acuity. 

## 2012-11-25 NOTE — [facility] Counselor (Addendum)
AC-Eric would like patient to understand that she will not get any type of narcotic. She will get only non-narcotic medications for example: Tylenol or Motrin. Patient agreed stating, "I understand and agree".

## 2012-11-25 NOTE — ED Notes (Signed)
Up to the bathroom 

## 2012-11-25 NOTE — Progress Notes (Signed)
Pt admitted on voluntary basis, requesting detox from heroin, pt states she has been using daily and has been using daily the past couple months. Pt does endorse passive SI but able to contract for safety on the unit. Pt stated that she was detoxed in the hospital a couple months ago. Pt does have a history of seizures and thyroid problems and stated that she has not been on any of her medications the past couple months. Pt was oriented to the unit and safety maintained.

## 2012-11-25 NOTE — ED Notes (Signed)
Security delayed, will transport as soon as possible

## 2012-11-25 NOTE — Tx Team (Signed)
Initial Interdisciplinary Treatment Plan  PATIENT STRENGTHS: (choose at least two) Ability for insight Average or above average intelligence Capable of independent living  PATIENT STRESSORS: Financial difficulties Substance abuse   PROBLEM LIST: Problem List/Patient Goals Date to be addressed Date deferred Reason deferred Estimated date of resolution  Depression 11/25/12     Substance Abuse 11/25/12                                                DISCHARGE CRITERIA:  Ability to meet basic life and health needs Improved stabilization in mood, thinking, and/or behavior Withdrawal symptoms are absent or subacute and managed without 24-hour nursing intervention  PRELIMINARY DISCHARGE PLAN: Outpatient therapy Placement in alternative living arrangements  PATIENT/FAMIILY INVOLVEMENT: This treatment plan has been presented to and reviewed with the patient, Barbara Key, and/or family member, .  The patient and family have been given the opportunity to ask questions and make suggestions.  Fenna Semel, [place] 11/25/2012, 5:33 PM

## 2012-11-25 NOTE — ED Notes (Signed)
Feeling some better after the clonidine

## 2012-11-25 NOTE — Progress Notes (Signed)
D:Patient in bed on approach.  Patient states she is restless and anxious at the present moment.  Patient states she was tired of doing drugs and states she wants to clean up her life.  Patient states she has depression and anxiety but mainly wtates she is here for detox and having withdrawals.  Patient states she is constantly restless and has restless legs.  Patient states she is passive SI denies HI and denies AVH.  Patient verbally contracts for safety A: Staff to monitor Q 15 mins for safety.  Encouragement and support offered.  Scheduled medications administered per orders.  Vistaril administered prn for anxiety.  Trazodone administered prn for sleep. R: Patient remains safe on the unit.  Patient did not attend karaoke group tonight. Patient isolative and remains in her room.  Patient cooperative and taking administered medications.

## 2012-11-25 NOTE — [facility] Counselor (Signed)
Patient accepted to [facility] by Serena Colonel, NP to Dr. Geoffery Lyons. Patients room # is 305-1. EDP-Dr. Mancel Bale made aware of patients disposition. Patients nurse Wille Celeste also made aware. Call report # is [phone]. All support paperwork completed and faxed to [facility].Patient is voluntary and will be transported via security.

## 2012-11-25 NOTE — ED Notes (Signed)
Pt reports that if she leaves here she will "kill myself"

## 2012-11-25 NOTE — ED Notes (Signed)
Feeling some better, still w/ generalized aching, less restless

## 2012-11-25 NOTE — ED Notes (Signed)
Requesting something for restlessness, medicated

## 2012-11-25 NOTE — ED Notes (Signed)
ACT into see 

## 2012-11-25 NOTE — [facility] Counselor (Signed)
Per Donell Sievert, pt declined due to acuity, no addt'l info or further reasons provided.  *Pt will be re-ran by physician @ [facility] today 11/25/12  as the reason for denial is not clear. Patient has remained calm and cooperative during her ED visit. No other exclusionary criteria in patients chart or assessment.

## 2012-11-25 NOTE — ED Notes (Signed)
Dr wentz into see 

## 2012-11-25 NOTE — ED Notes (Signed)
Pt ambuulatory to [facility] w/ NT and security, 2 pt belongings bags and duffel bag sent w/ pt

## 2012-11-25 NOTE — BH Assessment (Signed)
Assessment Note   Barbara Key is a 41 y.o. female who presents to [facility] for detox from heroin and thc.  Pt denies AVH, but says she experiences SI at least 3-4x's a week.  During assessment, pt says feels safe.  Pt says tell this writer she uses 1.5 bundles of heroin, daily, last use was 11/24/12; also uses 1 gram of THC, last use was 11/24/12.  Pt admits to occasionally drinking 2-3 beers, last drink was 11/24/12, consumed 1 beer.  Pt says she has seizures when going through withdrawals, last seizure was 2013.  Pt is injecting heroin in bilateral arms and hands and went to emerg dept([facility]) last week for infection in both hands at injection sites.  There is discoloration on bilateral hands at injection sites and hands are slightly swollen.  Pt has current legal charges and pending court date on 12/01/12--4 felonies, 6 misdemeanor: felony larceny, shoplifting, felony possession of heroin, thc and crack. Pt c/o w/d sxs: headache, fatigue, body aches, sweats, stuffy nose  Axis I: MDD, recurrent; Opioid Dependencef; Cannabis Abuse  Axis II: Deferred Axis III:  Past Medical History  Diagnosis Date  . Thyroid disease   . Polysubstance (excluding opioids) dependence   . Hypothyroidism   . Mental disorder     suicidal Ideation  . Mood disorder, drug-induced   . Radial nerve palsy 03/2012    resolved  . Hepatitis C    Axis IV: other psychosocial or environmental problems, problems related to legal system/crime, problems related to social environment and problems with primary support group Axis V: 31-40 impairment in reality testing  Past Medical History:  Past Medical History  Diagnosis Date  . Thyroid disease   . Polysubstance (excluding opioids) dependence   . Hypothyroidism   . Mental disorder     suicidal Ideation  . Mood disorder, drug-induced   . Radial nerve palsy 03/2012    resolved  . Hepatitis C     Past Surgical History  Procedure Laterality Date  . Foot  surgery  3/13; 06/12    to repair a deformity: plate and screws to Rt then Lt foot  . Tubal ligation    . Endometrial ablation    . Flexible sigmoidoscopy  09/11/2012    Procedure: FLEXIBLE SIGMOIDOSCOPY;  Surgeon: Barrie Folk, MD;  Location: [facility] ENDOSCOPY;  Service: Endoscopy;  Laterality: N/A;    Family History:  Family History  Problem Relation Age of Onset  . Cancer Mother   . Alcohol abuse Father     Social History:  reports that she has been smoking Cigarettes.  She has a 26 pack-year smoking history. She has never used smokeless tobacco. She reports that  drinks alcohol. She reports that she uses illicit drugs ("Crack" cocaine, Heroin, Cocaine, and Marijuana).  Additional Social History:  Alcohol / Drug Use Pain Medications: See MAR  Prescriptions: See MAR  Over the Counter: See MAR  History of alcohol / drug use?: Yes Longest period of sobriety (when/how long): Only when in detox  Negative Consequences of Use: Personal relationships;Legal;Financial;Work / School Withdrawal Symptoms: Seizures;Sweats;Other (Comment) (Fatigue, Headache, Body Aches, Stuffy Nose ) Onset of Seizures:  (Last seizure was 2013; brought on by withdrawals ) Date of most recent seizure:  (2013) Substance #1 Name of Substance 1: Heroin--DOC  1 - Age of First Use: 39 YOF 1 - Amount (size/oz): 1.5 Bundles  1 - Frequency: Daily  1 - Duration: On-going  1 - Last Use / Amount:  11/24/12 Substance #2 Name of Substance 2: THC  2 - Age of First Use: Teens  2 - Amount (size/oz): 1 Gram  2 - Frequency: Daily  2 - Duration: On-going  2 - Last Use / Amount: 11/24/12  CIWA: CIWA-Ar BP: 99/70 mmHg Pulse Rate: 117 Nausea and Vomiting: no nausea and no vomiting Tactile Disturbances: none Tremor: no tremor Auditory Disturbances: not present Paroxysmal Sweats: barely perceptible sweating, palms moist Visual Disturbances: not present Anxiety: no anxiety, at ease Headache, Fullness in Head: mild Agitation:  normal activity Orientation and Clouding of Sensorium: oriented and can do serial additions CIWA-Ar Total: 3 COWS: Clinical Opiate Withdrawal Scale (COWS) Resting Pulse Rate: Pulse Rate 101-120 Sweating: Subjective report of chills or flushing Restlessness: Able to sit still Pupil Size: Pupils pinned or normal size for room light Bone or Joint Aches: Patient reports sever diffuse aching of joints/muscles Runny Nose or Tearing: Not present GI Upset: No GI symptoms Tremor: No tremor Yawning: No yawning Anxiety or Irritability: None Gooseflesh Skin: Skin is smooth COWS Total Score: 5  Allergies: No Known Allergies  Home Medications:  (Not in a hospital admission)  OB/GYN Status:  No LMP recorded. Patient has had an ablation.  General Assessment Data Location of Assessment: [facility] ED Living Arrangements: Alone Can pt return to current living arrangement?: Yes Admission Status: Voluntary Is patient capable of signing voluntary admission?: Yes Transfer from: Acute Hospital Referral Source: MD  Education Status Is patient currently in school?: No Current Grade: None  Highest grade of school patient has completed: None  Name of school: None  Contact person: None   Risk to self Suicidal Ideation: No-Not Currently/Within Last 6 Months Suicidal Intent: No-Not Currently/Within Last 6 Months Is patient at risk for suicide?: Yes Suicidal Plan?: No-Not Currently/Within Last 6 Months Access to Means: Yes Specify Access to Suicidal Means: Illegal, Drugs, Sharps< Meds What has been your use of drugs/alcohol within the last 12 months?: Abusing: heroin, THC  Previous Attempts/Gestures: No How many times?: 0 Other Self Harm Risks: SA Triggers for Past Attempts: None known Intentional Self Injurious Behavior: None Family Suicide History: No Recent stressful life event(s): Other (Comment) (Chronic SA ) Persecutory voices/beliefs?: No Depression: Yes Depression Symptoms: Loss of interest  in usual pleasures;Feeling worthless/self pity Substance abuse history and/or treatment for substance abuse?: Yes Suicide prevention information given to non-admitted patients: Not applicable  Risk to Others Homicidal Ideation: No Thoughts of Harm to Others: No Current Homicidal Intent: No Current Homicidal Plan: No Access to Homicidal Means: No Identified Victim: None  History of harm to others?: No Assessment of Violence: None Noted Violent Behavior Description: None  Does patient have access to weapons?: No Criminal Charges Pending?: Yes Describe Pending Criminal Charges: Shoplifting, larceny, Felony possession heroin, thc, crack  Does patient have a court date: Yes Court Date: 12/01/12  Psychosis Hallucinations: None noted Delusions: None noted  Mental Status Report Appear/Hygiene: Disheveled;Poor hygiene Eye Contact: Poor Motor Activity: Unremarkable Speech: Logical/coherent;Soft Level of Consciousness: Alert Mood: Depressed;Sad Affect: Depressed;Sad Anxiety Level: None Thought Processes: Coherent;Relevant Judgement: Unimpaired Orientation: Person;Place;Time;Situation Obsessive Compulsive Thoughts/Behaviors: None  Cognitive Functioning Concentration: Normal Memory: Recent Intact;Remote Intact IQ: Average Insight: Fair Impulse Control: Fair Appetite: Good Weight Loss: 0 Weight Gain: 0 Sleep: No Change Total Hours of Sleep: 6 Vegetative Symptoms: None  ADLScreening [facility] Assessment Services) Patient's cognitive ability adequate to safely complete daily activities?: Yes Patient able to express need for assistance with ADLs?: Yes Independently performs ADLs?: Yes (appropriate for developmental age)  Abuse/Neglect [facility]) Physical Abuse: Denies Verbal Abuse: Denies Sexual Abuse: Denies  Prior Inpatient Therapy Prior Inpatient Therapy: Yes Prior Therapy Dates: Unk  Prior Therapy Facilty/Provider(s): [facility], [facility], [facility]  Reason for  Treatment: Detox/rehab   Prior Outpatient Therapy Prior Outpatient Therapy: No Prior Therapy Dates: None  Prior Therapy Facilty/Provider(s): None  Reason for Treatment: None   ADL Screening (condition at time of admission) Patient's cognitive ability adequate to safely complete daily activities?: Yes Patient able to express need for assistance with ADLs?: Yes Independently performs ADLs?: Yes (appropriate for developmental age) Weakness of Legs: None Weakness of Arms/Hands: None  Home Assistive Devices/Equipment Home Assistive Devices/Equipment: None  Therapy Consults (therapy consults require a physician order) PT Evaluation Needed: No OT Evalulation Needed: No SLP Evaluation Needed: No Abuse/Neglect Assessment (Assessment to be complete while patient is alone) Physical Abuse: Denies Verbal Abuse: Denies Sexual Abuse: Denies Exploitation of patient/patient's resources: Denies Self-Neglect: Denies Values / Beliefs Cultural Requests During Hospitalization: None Spiritual Requests During Hospitalization: None Consults Spiritual Care Consult Needed: No Social Work Consult Needed: No Merchant navy officer (For Healthcare) Advance Directive: Patient does not have advance directive;Patient would not like information Pre-existing out of facility DNR order (yellow form or pink MOST form): No Nutrition Screen- [facility] Adult/[facility] Patient's home diet: Regular Have you recently lost weight without trying?: No Have you been eating poorly because of a decreased appetite?: No Malnutrition Screening Tool Score: 0  Additional Information 1:1 In Past 12 Months?: No CIRT Risk: No Elopement Risk: No Does patient have medical clearance?: Yes     Disposition:  Disposition Initial Assessment Completed: Yes Disposition of Patient: Inpatient treatment program;Referred to [facility] ) Type of inpatient treatment program: Adult Patient referred to: Other (Comment) [facility] )  On Site Evaluation by:    Reviewed with Physician:     Murrell Redden 11/25/2012 3:22 AM

## 2012-11-26 ENCOUNTER — Encounter (HOSPITAL_COMMUNITY): Payer: Self-pay | Admitting: Psychiatry

## 2012-11-26 DIAGNOSIS — F1193 Opioid use, unspecified with withdrawal: Secondary | ICD-10-CM | POA: Diagnosis present

## 2012-11-26 DIAGNOSIS — F329 Major depressive disorder, single episode, unspecified: Secondary | ICD-10-CM

## 2012-11-26 DIAGNOSIS — F1120 Opioid dependence, uncomplicated: Principal | ICD-10-CM

## 2012-11-26 DIAGNOSIS — F1220 Cannabis dependence, uncomplicated: Secondary | ICD-10-CM

## 2012-11-26 DIAGNOSIS — F1123 Opioid dependence with withdrawal: Secondary | ICD-10-CM | POA: Diagnosis present

## 2012-11-26 NOTE — Progress Notes (Signed)
[facility] LCSW Group Therapy  11/26/2012 1:15 PM  Type of Therapy:  Group Therapy  Participation Level:  Did Not Attend  Clide Dales

## 2012-11-26 NOTE — Progress Notes (Signed)
Patient did not attend the evening karaoke group. Pt remained in bed sleeping due to withdrawal and being admitted today.

## 2012-11-26 NOTE — Progress Notes (Signed)
Adult Psychoeducational Group Note  Date:  11/26/2012 Time:  11:28 PM  Group Topic/Focus:  AA group  Participation Level:  Active  Participation Quality:  Appropriate  Affect:  Appropriate  Cognitive:  Alert  Insight: Appropriate  Engagement in Group:  Improving  Modes of Intervention:  Discussion  Additional Comments:    Flonnie Hailstone 11/26/2012, 11:28 PM

## 2012-11-26 NOTE — Progress Notes (Signed)
[facility] LCSW Aftercare Discharge Planning Group Note  11/26/2012 8:45 AM  Participation Quality: Did not attend  Clide Dales

## 2012-11-26 NOTE — H&P (Signed)
Psychiatric Admission Assessment Adult  Patient Identification:  Barbara Key Date of Evaluation:  11/26/2012 Chief Complaint:  Opioid Dependence  Cannabis Abuse MDD, recurrent, severe History of Present Illness:: 42 Y/O female that comes requesting opioid detox. She has been using heroin IV every day after she relapsed 8 months ago. In September she overdosed  Intentionally trying to kill herself.. She had been here several times. She also abuses marijuana. She states that she suffers from depression. She has charges pending, has to go to court. Admits to aches, pains, migraine, nausea, vomiting, sweats, agitation. Has not been able to sleep. Was not able to afford the Cymbalta, she quit taking it. Claim the Cymbalta was working Elements:  Location:  in patient. Quality:  unable to function. Severity:  severe. Timing:  every day. Duration:  last several months. Context:  Opioid dependence, major depression, active addiction, criminal behavior. Associated Signs/Synptoms: Depression Symptoms:  depressed mood, anhedonia, insomnia, fatigue, feelings of worthlessness/guilt, difficulty concentrating, hopelessness, recurrent thoughts of death, suicidal thoughts with specific plan, suicidal attempt, anxiety, panic attacks, loss of energy/fatigue, weight loss, decreased appetite, (Hypo) Manic Symptoms:  Denies Anxiety Symptoms:  Excessive Worry, Panic Symptoms, Psychotic Symptoms:  Denies PTSD Symptoms: Denies   Psychiatric Specialty Exam: Physical Exam  Review of Systems  Constitutional: Positive for malaise/fatigue.  Eyes: Negative.   Respiratory: Negative.   Cardiovascular: Negative.   Gastrointestinal: Positive for nausea and vomiting.  Genitourinary: Negative.   Musculoskeletal: Positive for myalgias.  Skin: Negative.   Neurological: Positive for dizziness, weakness and headaches.  Endo/Heme/Allergies: Negative.   Psychiatric/Behavioral: Positive for depression,  suicidal ideas and substance abuse. The patient is nervous/anxious and has insomnia.     Blood pressure 126/88, pulse 103, temperature 96 F (35.6 C), temperature source Oral, resp. rate 16, height 5' 6.25" (1.683 m), weight 76.204 kg (168 lb).Body mass index is 26.9 kg/(m^2).  General Appearance: Disheveled  Eye Contact::  Minimal  Speech:  Clear and Coherent, Slow and not spontaneous  Volume:  Decreased  Mood:  Anxious, Depressed, Dysphoric and Worthless  Affect:  anxious, depressed, in pain  Thought Process:  Coherent and Goal Directed  Orientation:  Full (Time, Place, and Person)  Thought Content:  somatically focused  Suicidal Thoughts:  Yes.  with intent/plan  Homicidal Thoughts:  No  Memory:  Immediate;   Fair Recent;   Fair Remote;   Fair  Judgement:  Fair  Insight:  Shallow  Psychomotor Activity:  Restlessness  Concentration:  Fair  Recall:  Fair  Akathisia:  No  Handed:  Right  AIMS (if indicated):     Assets:  Desire for Improvement  Sleep:  Number of Hours: 4.5    Past Psychiatric History: Diagnosis: Opioid Dependence, Major Depression  Hospitalizations:[facility], [facility]  Outpatient Care:not currnently  Substance Abuse Care:[facility]  Self-Mutilation: Denies  Suicidal Attempts:YES  Violent Behaviors:Denies   Past Medical History:   Past Medical History  Diagnosis Date  . Thyroid disease   . Polysubstance (excluding opioids) dependence   . Hypothyroidism   . Mental disorder     suicidal Ideation  . Mood disorder, drug-induced   . Radial nerve palsy 03/2012    resolved  . Hepatitis C    Seizure History:  drug related Allergies:  No Known Allergies PTA Medications: Prescriptions prior to admission  Medication Sig Dispense Refill  . busPIRone (BUSPAR) 15 MG tablet Take 1 tablet (15 mg total) by mouth 3 (three) times daily.  90 tablet  1  .  cloNIDine (CATAPRES) 0.1 MG tablet Take 1 tablet (0.1 mg total) by mouth 2 (two) times daily. TAPER:  Take twice  daily for 3 days, and then once daily for 3 days then stop.  9 tablet  0  . DULoxetine (CYMBALTA) 30 MG capsule Take 1 capsule (30 mg total) by mouth daily.  30 capsule  1  . gabapentin (NEURONTIN) 400 MG capsule Take 1 capsule (400 mg total) by mouth 3 (three) times daily.  90 capsule  0  . ibuprofen (ADVIL,MOTRIN) 200 MG tablet Take 1,200 mg by mouth once.      Marland Kitchen levothyroxine (SYNTHROID, LEVOTHROID) 100 MCG tablet Take 1 tablet (100 mcg total) by mouth daily before breakfast.  30 tablet  1  . lidocaine (LIDODERM) 5 % Place 2 patches onto the skin daily. Remove & Discard patch within 12 hours or as directed by MD  30 patch  0  . nicotine (NICODERM CQ - DOSED IN MG/24 HOURS) 21 mg/24hr patch Place 1 patch onto the skin daily. Absolutely do not smoke with the patch on.  If you must smoke please take the patch off.  14 patch  0  . oxyCODONE (OXY IR/ROXICODONE) 5 MG immediate release tablet Take 1 tablet (5 mg total) by mouth every 4 (four) hours as needed.  30 tablet  0  . pantoprazole (PROTONIX) 40 MG tablet Take 40 mg by mouth once.      . polyethylene glycol (MIRALAX / GLYCOLAX) packet Take 17 g by mouth 3 (three) times daily.  14 each    . risperiDONE (RISPERDAL) 2 MG tablet Take 1 tablet (2 mg total) by mouth at bedtime.  30 tablet  1  . traZODone (DESYREL) 50 MG tablet Take 1 tablet (50 mg total) by mouth at bedtime.  30 tablet  1    Previous Psychotropic Medications:  Medication/Dose  Zoloft, Cymbalta               Substance Abuse History in the last 12 months:  yes  Consequences of Substance Abuse: Legal Consequences:  possesion Family Consequences:  alienated from family Withdrawal Symptoms:   Cramps Diaphoresis Headaches Nausea Tremors Vomiting  Social History:  reports that she has been smoking Cigarettes.  She has a 26 pack-year smoking history. She has never used smokeless tobacco. She reports that  drinks alcohol. She reports that she uses illicit drugs ("Crack"  cocaine, Heroin, Cocaine, and Marijuana). Additional Social History:                      Current Place of Residence:  Lives with a boyfriend (not good) Place of Birth:   Family Members: Marital Status:  Divorced Children:  Sons: 1  Daughters:1 Relationships: Education:  Corporate treasurer Problems/Performance: Religious Beliefs/Practices: History of Abuse (Emotional/Phsycial/Sexual) Occupational Experiences; unemployed Hotel manager History:  None. Legal History: Pending charges, shop lifting Hobbies/Interests:  Family History:   Family History  Problem Relation Age of Onset  . Cancer Mother   . Alcohol abuse Father     Results for orders placed during the hospital encounter of 11/24/12 (from the past 72 hour(s))  CBC WITH DIFFERENTIAL     Status: Abnormal   Collection Time    11/24/12  8:36 PM      Result Value Range   WBC 6.5  4.0 - 10.5 K/uL   RBC 4.69  3.87 - 5.11 MIL/uL   Hemoglobin 15.5 (*) 12.0 - 15.0 g/dL   HCT 40.9  81.1 - 91.4 %  MCV 97.0  78.0 - 100.0 fL   MCH 33.0  26.0 - 34.0 pg   MCHC 34.1  30.0 - 36.0 g/dL   RDW 16.1  09.6 - 04.5 %   Platelets 224  150 - 400 K/uL   Neutrophils Relative 50  43 - 77 %   Neutro Abs 3.3  1.7 - 7.7 K/uL   Lymphocytes Relative 35  12 - 46 %   Lymphs Abs 2.2  0.7 - 4.0 K/uL   Monocytes Relative 10  3 - 12 %   Monocytes Absolute 0.6  0.1 - 1.0 K/uL   Eosinophils Relative 5  0 - 5 %   Eosinophils Absolute 0.3  0.0 - 0.7 K/uL   Basophils Relative 1  0 - 1 %   Basophils Absolute 0.0  0.0 - 0.1 K/uL  COMPREHENSIVE METABOLIC PANEL     Status: Abnormal   Collection Time    11/24/12  8:36 PM      Result Value Range   Sodium 135  135 - 145 mEq/L   Potassium 4.3  3.5 - 5.1 mEq/L   Chloride 101  96 - 112 mEq/L   CO2 25  19 - 32 mEq/L   Glucose, Bld 86  70 - 99 mg/dL   BUN 6  6 - 23 mg/dL   Creatinine, Ser 4.09  0.50 - 1.10 mg/dL   Calcium 9.0  8.4 - 81.1 mg/dL   Total Protein 8.3  6.0 - 8.3 g/dL   Albumin 3.9  3.5  - 5.2 g/dL   AST 46 (*) 0 - 37 U/L   ALT 72 (*) 0 - 35 U/L   Alkaline Phosphatase 92  39 - 117 U/L   Total Bilirubin 0.5  0.3 - 1.2 mg/dL   GFR calc non Af Amer 74 (*) >90 mL/min   GFR calc Af Amer 86 (*) >90 mL/min   Comment:            The eGFR has been calculated     using the CKD EPI equation.     This calculation has not been     validated in all clinical     situations.     eGFR's persistently     <90 mL/min signify     possible Chronic Kidney Disease.  ETHANOL     Status: Abnormal   Collection Time    11/24/12  8:36 PM      Result Value Range   Alcohol, Ethyl (B) 37 (*) 0 - 11 mg/dL   Comment:            LOWEST DETECTABLE LIMIT FOR     SERUM ALCOHOL IS 11 mg/dL     FOR MEDICAL PURPOSES ONLY  URINE RAPID DRUG SCREEN (HOSP PERFORMED)     Status: Abnormal   Collection Time    11/24/12  9:07 PM      Result Value Range   Opiates POSITIVE (*) NONE DETECTED   Cocaine NONE DETECTED  NONE DETECTED   Benzodiazepines NONE DETECTED  NONE DETECTED   Amphetamines NONE DETECTED  NONE DETECTED   Tetrahydrocannabinol POSITIVE (*) NONE DETECTED   Barbiturates NONE DETECTED  NONE DETECTED   Comment:            DRUG SCREEN FOR MEDICAL PURPOSES     ONLY.  IF CONFIRMATION IS NEEDED     FOR ANY PURPOSE, NOTIFY LAB     WITHIN 5 DAYS.  LOWEST DETECTABLE LIMITS     FOR URINE DRUG SCREEN     Drug Class       Cutoff (ng/mL)     Amphetamine      1000     Barbiturate      200     Benzodiazepine   200     Tricyclics       300     Opiates          300     Cocaine          300     THC              50  POCT PREGNANCY, URINE     Status: None   Collection Time    11/24/12  9:09 PM      Result Value Range   Preg Test, Ur NEGATIVE  NEGATIVE   Comment:            THE SENSITIVITY OF THIS     METHODOLOGY IS >24 mIU/mL   Psychological Evaluations:  Assessment:   AXIS I:  Opioid Dependence/withdrawal, marijuana dependence, Major Depression AXIS II:  Deferred AXIS III:    Past Medical History  Diagnosis Date  . Thyroid disease   . Polysubstance (excluding opioids) dependence   . Hypothyroidism   . Mental disorder     suicidal Ideation  . Mood disorder, drug-induced   . Radial nerve palsy 03/2012    resolved  . Hepatitis C    AXIS IV:  housing problems, occupational problems, other psychosocial or environmental problems, problems related to legal system/crime and problems with primary support group AXIS V:  41-50 serious symptoms  Treatment Plan/Recommendations:  Supportive approach/coping skills/relapse prevention                                                                 Clonidine detox                                                                 Reassess co morbidities  Treatment Plan Summary: Daily contact with patient to assess and evaluate symptoms and progress in treatment Medication management Current Medications:  Current Facility-Administered Medications  Medication Dose Route Frequency Provider Last Rate Last Dose  . acetaminophen (TYLENOL) tablet 650 mg  650 mg Oral Q6H PRN Sanjuana Kava, NP   650 mg at 11/26/12 4401  . alum & mag hydroxide-simeth (MAALOX/MYLANTA) 200-200-20 MG/5ML suspension 30 mL  30 mL Oral Q4H PRN Sanjuana Kava, NP      . busPIRone (BUSPAR) tablet 15 mg  15 mg Oral TID Sanjuana Kava, NP   15 mg at 11/26/12 0850  . cloNIDine (CATAPRES) tablet 0.1 mg  0.1 mg Oral QID Sanjuana Kava, NP   0.1 mg at 11/26/12 0849   Followed by  . [START ON 11/28/2012] cloNIDine (CATAPRES) tablet 0.1 mg  0.1 mg Oral BH-qamhs Sanjuana Kava, NP       Followed by  . [START ON 11/30/2012] cloNIDine (CATAPRES) tablet  0.1 mg  0.1 mg Oral QAC breakfast Sanjuana Kava, NP      . dicyclomine (BENTYL) tablet 20 mg  20 mg Oral Q6H PRN Sanjuana Kava, NP      . DULoxetine (CYMBALTA) DR capsule 30 mg  30 mg Oral Daily Sanjuana Kava, NP   30 mg at 11/26/12 0849  . gabapentin (NEURONTIN) capsule 400 mg  400 mg Oral TID Sanjuana Kava, NP   400 mg at  11/26/12 0849  . hydrOXYzine (ATARAX/VISTARIL) tablet 25 mg  25 mg Oral Q6H PRN Sanjuana Kava, NP   25 mg at 11/26/12 0504  . levothyroxine (SYNTHROID, LEVOTHROID) tablet 100 mcg  100 mcg Oral QAC breakfast Sanjuana Kava, NP   100 mcg at 11/26/12 5784  . lidocaine (LIDODERM) 5 % 1 patch  1 patch Transdermal Q24H Sanjuana Kava, NP   1 patch at 11/25/12 1839  . loperamide (IMODIUM) capsule 2-4 mg  2-4 mg Oral PRN Sanjuana Kava, NP      . magnesium hydroxide (MILK OF MAGNESIA) suspension 30 mL  30 mL Oral Daily PRN Sanjuana Kava, NP      . methocarbamol (ROBAXIN) tablet 500 mg  500 mg Oral Q8H PRN Sanjuana Kava, NP   500 mg at 11/26/12 0902  . naproxen (NAPROSYN) tablet 500 mg  500 mg Oral BID PRN Sanjuana Kava, NP   500 mg at 11/26/12 0503  . nicotine (NICODERM CQ - dosed in mg/24 hours) patch 21 mg  21 mg Transdermal Q0600 Sanjuana Kava, NP   21 mg at 11/26/12 0630  . ondansetron (ZOFRAN-ODT) disintegrating tablet 4 mg  4 mg Oral Q6H PRN Sanjuana Kava, NP      . pantoprazole (PROTONIX) EC tablet 40 mg  40 mg Oral Daily Sanjuana Kava, NP   40 mg at 11/26/12 0849  . polyethylene glycol (MIRALAX / GLYCOLAX) packet 17 g  17 g Oral Daily Sanjuana Kava, NP      . risperiDONE (RISPERDAL) tablet 2 mg  2 mg Oral QHS Sanjuana Kava, NP   2 mg at 11/25/12 2204  . traZODone (DESYREL) tablet 50 mg  50 mg Oral QHS PRN Sanjuana Kava, NP   50 mg at 11/25/12 2203    Observation Level/Precautions:  Detox 15 minute checks  Laboratory:  As per the ED  Psychotherapy:  Individual/group  Medications:  Clonidine detox  Consultations:    Discharge Concerns:    Estimated LOS: 5-7 days  Other:     I certify that inpatient services furnished can reasonably be expected to improve the patient's condition.   Theordore Cisnero A 2/28/20149:09 AM

## 2012-11-26 NOTE — Plan of Care (Signed)
Problem: Ineffective individual coping Goal: STG: Patient will participate in after care plan Outcome: Not Progressing Patient admitted within last 24 hours; not participating in groups as yet.   Problem: Alteration in mood & ability to function due to Goal: LTG-Patient demonstrates decreased signs of withdrawal (Patient demonstrates decreased signs of withdrawal to the point the patient is safe to return home and continue treatment in an outpatient setting)  Outcome: Not Progressing Patient admitted within last 24 hours; experiencing withdrawal symptoms currently  Problem: Alteration in mood Goal: LTG-Pt's behavior demonstrates decreased signs of depression (Patient's behavior demonstrates decreased signs of depression to the point the patient is safe to return home and continue treatment in an outpatient setting)  Outcome: Not Progressing Patient admitted within last 24 hours; not participating in groups as yet.

## 2012-11-26 NOTE — [facility] Suicide Risk Assessment (Signed)
Suicide Risk Assessment  Admission Assessment     Nursing information obtained from:  Patient Demographic factors:  Caucasian;Low socioeconomic status;Unemployed Current Mental Status:  Self-harm thoughts Loss Factors:  Financial problems / change in socioeconomic status Historical Factors:  Family history of mental illness or substance abuse Risk Reduction Factors:  Living with another person, especially a relative;Positive social support  CLINICAL FACTORS:   Depression:   Comorbid alcohol abuse/dependence Alcohol/Substance Abuse/Dependencies  COGNITIVE FEATURES THAT CONTRIBUTE TO RISK: None identified   SUICIDE RISK:   Moderate:  Frequent suicidal ideation with limited intensity, and duration, some specificity in terms of plans, no associated intent, good self-control, limited dysphoria/symptomatology, some risk factors present, and identifiable protective factors, including available and accessible social support.  PLAN OF CARE: Supportive approach/coping skills/relapse prevention                               Detox                               Address co morbidities  I certify that inpatient services furnished can reasonably be expected to improve the patient's condition.  Alondria Mousseau A 11/26/2012, 3:59 PM
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
## 2012-11-26 NOTE — Progress Notes (Addendum)
Barbara Key is having a tough withdrawal today. She said she cannot get out of her bed and would not try this morning for her AM meds. SHe was medicated with  Tylenol and robaxin  this AM for c/o pain " all over"  And then naproxen and zofran.    A She denies SI. This Clinical research associate has encouraged her to drink fluids, has encouraged her to get OOB and bathe and eat and offered pos feedback.   R Safety is maintained and POC contd

## 2012-11-26 NOTE — Tx Team (Signed)
Interdisciplinary Treatment Plan Update (Adult)  Date: 11/26/2012  Time Reviewed: 9:45 AM   Progress in Treatment: Attending groups: Not as yet Participating in groups: Not as yet Taking medication as prescribed:  Yes Tolerating medication:  Yes Family/Significant othe contact made: No Patient understands diagnosis: Yes Discussing patient identified problems/goals with staff: Yes Medical problems stabilized or resolved:  Not as yet  Denies suicidal/homicidal ideation: Unknown at this time Patient has not harmed self or Others: Yes  New problem(s) identified: None Identified  Discharge Plan or Barriers:  CSW is assessing for appropriate referrals.   Additional comments: N/A  Reason for Continuation of Hospitalization:  Depression Medication stabilization Withdrawal symptoms   Estimated length of stay: 5 days  For review of initial/current patient goals, please see plan of care.  Attendees: Patient:     Family:     Physician:  Geoffery Lyons 11/26/2012 9:45 AM   Nursing:   Shelda Jakes, RN 11/26/2012 9:45 AM   Clinical Social Worker Ronda Fairly 11/26/2012 9:45 AM   Other:  Armandina Stammer, NP 11/26/2012 9:45 AM   Other:  Liliane Bade, TCC 11/26/2012 9:45 AM   Other:  Jari Favre, Sherrie Sport PA Student 11/26/2012 9:45 AM   Other:  Carney Living, RN 11/26/2012 9:45 AM    Scribe for Treatment Team:   Carney Bern, LCSWA  11/26/2012 9:45 AM

## 2012-11-27 ENCOUNTER — Encounter (HOSPITAL_COMMUNITY): Payer: Self-pay | Admitting: Registered Nurse

## 2012-11-27 DIAGNOSIS — F1994 Other psychoactive substance use, unspecified with psychoactive substance-induced mood disorder: Secondary | ICD-10-CM

## 2012-11-27 DIAGNOSIS — F1910 Other psychoactive substance abuse, uncomplicated: Secondary | ICD-10-CM

## 2012-11-27 MED ORDER — PROMETHAZINE HCL 25 MG PO TABS
25.0000 mg | ORAL_TABLET | Freq: Once | ORAL | Status: AC
  Administered 2012-11-27: 25 mg via ORAL
  Filled 2012-11-27 (×2): qty 1

## 2012-11-27 MED ORDER — IBUPROFEN 800 MG PO TABS
800.0000 mg | ORAL_TABLET | Freq: Four times a day (QID) | ORAL | Status: DC | PRN
  Administered 2012-11-27 – 2012-11-30 (×4): 800 mg via ORAL
  Filled 2012-11-27 (×4): qty 1

## 2012-11-27 NOTE — Progress Notes (Signed)
Patient ID: Barbara Key, female   DOB: Oct 13, 1970, 42 y.o.   MRN: [id number] D: Pt is awake and active on the unit this AM. Pt denies SI/HI and A/V hallucinations. Pt is participating in the milieu and is cooperative with staff. Pt rates their depression at 8 and hopelessness at 10. Pt's most recent COWS is 11. Pt mood is depressed and her affect is anxious/sad. Pt c/o withdrawal symptoms and is spending much of her time in bed after breakfast. Pt writes that she wants to "go to meetings and stop getting into trouble with the law." Pt also complains that she is having difficulty staying asleep through the night.   A: Writer utilized therapeutic communication, encouraged pt to discuss feelings with staff and administered medication per MD orders. Writer also encouraged pt to attend groups.  R: Pt is not attending groups but is tolerating medications well. Writer will continue to monitor. 15 minute checks are ongoing for safety.

## 2012-11-27 NOTE — Clinical Social Work Note (Signed)
[facility] Group Notes: (Clinical Social Work)   11/27/2012      Type of Therapy:  Group Therapy   Participation Level:  Did Not Attend    Ambrose Mantle, LCSW 11/27/2012, 12:31 PM

## 2012-11-27 NOTE — Progress Notes (Signed)
[facility] MD Progress Note  11/27/2012 12:33 PM Barbara Key  MRN:  [id number] Subjective:  Patient stated she was "up all night with vomiting and nausea."  She is currently in bed at this time, no appetite due to nausea.  Zofran is not being effective so a one time dose of phenergan ordered, complains of a headache but does not want her acetaminophen--ibuprofen ordered and naprosyn and acetaminophen discontinued.  Fluids encouraged, sips until nausea improves. Diagnosis:   Axis I: Substance Abuse and Substance Induced Mood Disorder Axis II: Deferred Axis III:  Past Medical History  Diagnosis Date  . Thyroid disease   . Polysubstance (excluding opioids) dependence   . Hypothyroidism   . Mental disorder     suicidal Ideation  . Mood disorder, drug-induced   . Radial nerve palsy 03/2012    resolved  . Hepatitis C    Axis IV: economic problems, occupational problems, other psychosocial or environmental problems, problems related to social environment and problems with primary support group Axis V: 41-50 serious symptoms  ADL's:  Impaired  Sleep: Poor  Appetite:  Poor  Suicidal Ideation:  Denies Homicidal Ideation:  Denies  Psychiatric Specialty Exam: Review of Systems  Constitutional: Negative.   HENT: Negative.   Eyes: Negative.   Respiratory: Negative.   Cardiovascular: Negative.   Gastrointestinal: Negative.   Genitourinary: Negative.   Musculoskeletal: Negative.   Skin: Negative.   Neurological: Negative.   Endo/Heme/Allergies: Negative.   Psychiatric/Behavioral: Positive for depression and substance abuse. The patient is nervous/anxious.     Blood pressure 126/91, pulse 86, temperature 98.7 F (37.1 C), temperature source Oral, resp. rate 20, height 5' 6.25" (1.683 m), weight 76.204 kg (168 lb).Body mass index is 26.9 kg/(m^2).  General Appearance: Casual  Eye Contact::  Fair  Speech:  Slow  Volume:  Decreased  Mood:  Anxious and Depressed  Affect:  Depressed   Thought Process:  Coherent  Orientation:  Full (Time, Place, and Person)  Thought Content:  WDL  Suicidal Thoughts:  No  Homicidal Thoughts:  No  Memory:  Immediate;   Fair Recent;   Fair Remote;   Fair  Judgement:  Impaired  Insight:  Fair  Psychomotor Activity:  Decreased  Concentration:  Fair  Recall:  Fair  Akathisia:  No  Handed:  Right  AIMS (if indicated):     Assets:  Resilience  Sleep:  Number of Hours: 4.5   Current Medications: Current Facility-Administered Medications  Medication Dose Route Frequency Provider Last Rate Last Dose  . alum & mag hydroxide-simeth (MAALOX/MYLANTA) 200-200-20 MG/5ML suspension 30 mL  30 mL Oral Q4H PRN Sanjuana Kava, NP   30 mL at 11/27/12 0826  . busPIRone (BUSPAR) tablet 15 mg  15 mg Oral TID Sanjuana Kava, NP   15 mg at 11/27/12 0827  . cloNIDine (CATAPRES) tablet 0.1 mg  0.1 mg Oral QID Sanjuana Kava, NP   0.1 mg at 11/27/12 5621   Followed by  . [START ON 11/28/2012] cloNIDine (CATAPRES) tablet 0.1 mg  0.1 mg Oral BH-qamhs Sanjuana Kava, NP       Followed by  . [START ON 11/30/2012] cloNIDine (CATAPRES) tablet 0.1 mg  0.1 mg Oral QAC breakfast Sanjuana Kava, NP      . dicyclomine (BENTYL) tablet 20 mg  20 mg Oral Q6H PRN Sanjuana Kava, NP   20 mg at 11/26/12 1727  . DULoxetine (CYMBALTA) DR capsule 30 mg  30 mg Oral Daily Nicole Kindred  I Nwoko, NP   30 mg at 11/27/12 0828  . gabapentin (NEURONTIN) capsule 400 mg  400 mg Oral TID Sanjuana Kava, NP   400 mg at 11/27/12 0827  . hydrOXYzine (ATARAX/VISTARIL) tablet 25 mg  25 mg Oral Q6H PRN Sanjuana Kava, NP   25 mg at 11/27/12 0826  . ibuprofen (ADVIL,MOTRIN) tablet 800 mg  800 mg Oral Q6H PRN Cleotis Nipper, MD      . levothyroxine (SYNTHROID, LEVOTHROID) tablet 100 mcg  100 mcg Oral QAC breakfast Sanjuana Kava, NP   100 mcg at 11/27/12 [phone]  . lidocaine (LIDODERM) 5 % 1 patch  1 patch Transdermal Q24H Sanjuana Kava, NP   1 patch at 11/26/12 1740  . loperamide (IMODIUM) capsule 2-4 mg  2-4 mg Oral  PRN Sanjuana Kava, NP      . magnesium hydroxide (MILK OF MAGNESIA) suspension 30 mL  30 mL Oral Daily PRN Sanjuana Kava, NP      . methocarbamol (ROBAXIN) tablet 500 mg  500 mg Oral Q8H PRN Sanjuana Kava, NP   500 mg at 11/26/12 0902  . nicotine (NICODERM CQ - dosed in mg/24 hours) patch 21 mg  21 mg Transdermal Q0600 Sanjuana Kava, NP   21 mg at 11/27/12 0648  . ondansetron (ZOFRAN-ODT) disintegrating tablet 4 mg  4 mg Oral Q6H PRN Sanjuana Kava, NP   4 mg at 11/27/12 6213  . pantoprazole (PROTONIX) EC tablet 40 mg  40 mg Oral Daily Sanjuana Kava, NP   40 mg at 11/27/12 0827  . polyethylene glycol (MIRALAX / GLYCOLAX) packet 17 g  17 g Oral Daily Sanjuana Kava, NP   17 g at 11/27/12 0829  . promethazine (PHENERGAN) tablet 25 mg  25 mg Oral Once Cleotis Nipper, MD      . risperiDONE (RISPERDAL) tablet 2 mg  2 mg Oral QHS Sanjuana Kava, NP   2 mg at 11/26/12 2123  . traZODone (DESYREL) tablet 50 mg  50 mg Oral QHS PRN Sanjuana Kava, NP   50 mg at 11/26/12 2123    Lab Results: No results found for this or any previous visit (from the past 48 hour(s)).  Physical Findings: AIMS: Facial and Oral Movements Muscles of Facial Expression: None, normal Lips and Perioral Area: None, normal Jaw: None, normal Tongue: None, normal,Extremity Movements Upper (arms, wrists, hands, fingers): None, normal Lower (legs, knees, ankles, toes): None, normal, Trunk Movements Neck, shoulders, hips: None, normal, Overall Severity Severity of abnormal movements (highest score from questions above): None, normal Incapacitation due to abnormal movements: None, normal Patient's awareness of abnormal movements (rate only patient's report): No Awareness, Dental Status Current problems with teeth and/or dentures?: No Does patient usually wear dentures?: No  CIWA:  CIWA-Ar Total: 11 COWS:  COWS Total Score: 7  Treatment Plan Summary: Daily contact with patient to assess and evaluate symptoms and progress in  treatment Medication management  Plan:   Phenergan one time dose for nausea and vomiting, zofran will continue after this.  Ibuprofen ordered for her headache, acetaminophen and naprosyn discontinued due to ibuprofen order.  Fluids encouraged, rest, quiet, and low lights.  Positive encouragement and symptomatic treatment given during this withdrawal from heroine period.  Medical Decision Making Problem Points:  Established problem, worsening (2) and Review of psycho-social stressors (1) Data Points:  Review of new medications or change in dosage (2)  I certify that inpatient services furnished  can reasonably be expected to improve the patient's condition.   Kathryne Sharper, MD 11/27/2012, 12:33 PM

## 2012-11-27 NOTE — Progress Notes (Addendum)
Adult Psychoeducational Group Note  Date:  11/27/2012 Time:  0900  Group Topic/Focus:  Self Inventory Review  Participation Level: Did not attend  Additional Comments:  Pt is nauseous and tired. Pt stayed in bed this AM.   Barbara Key Shari Prows 11/27/2012, 9:51 AM

## 2012-11-27 NOTE — Progress Notes (Signed)
Patient ID: Barbara Key, female   DOB: Feb 11, 1971, 42 y.o.   MRN: [id number] D)  Has spent the evening in bed, stated was nauseated, achey, feeling bad, didn't want to get up to go to group this evening.  Has been sipping ginger ale, was given zofran, tylenol ,clonidine as ordered.  Resting quietly tonight, refused snack. A)  Will continue to monitor for safety, POC, encourage participation in group, milieu as she feels better. R)  Safety maintained.

## 2012-11-27 NOTE — Progress Notes (Signed)
Adult Psychoeducational Group Note  Date:  11/27/2012 Time:  2:42 PM  Group Topic/Focus:  Healthy Communication:   The focus of this group is to discuss communication, barriers to communication, as well as healthy ways to communicate with others.  Participation Level:  Active  Participation Quality:  Appropriate, Attentive, Sharing and Supportive  Affect:  Flat  Cognitive:  Alert and Appropriate  Insight: Appropriate and Good  Engagement in Group:  Developing/Improving and Engaged  Modes of Intervention:  Discussion, Exploration, Problem-solving and Support  Additional Comments:  Pt was engaged and actively participated in group, volunteered to read out loud from the [employer] handbook, pt wants to take home the love language activity and have her significant other take the quiz so that they can learn about each other to improve communication and relationship, pt is supportive to others.  Alfonse Spruce 11/27/2012, 2:42 PM

## 2012-11-28 ENCOUNTER — Encounter (HOSPITAL_COMMUNITY): Payer: Self-pay | Admitting: Registered Nurse

## 2012-11-28 DIAGNOSIS — F1910 Other psychoactive substance abuse, uncomplicated: Secondary | ICD-10-CM

## 2012-11-28 DIAGNOSIS — F1994 Other psychoactive substance use, unspecified with psychoactive substance-induced mood disorder: Secondary | ICD-10-CM

## 2012-11-28 MED ORDER — MAGNESIUM CITRATE PO SOLN: 1.0000 | Freq: Once | ORAL | Status: DC

## 2012-11-28 MED ORDER — TRAZODONE HCL 50 MG PO TABS
50.0000 mg | ORAL_TABLET | Freq: Every evening | ORAL | Status: DC | PRN
  Administered 2012-11-28 – 2012-11-30 (×3): 50 mg via ORAL
  Filled 2012-11-28: qty 1
  Filled 2012-11-28: qty 28
  Filled 2012-11-28: qty 1

## 2012-11-28 NOTE — Progress Notes (Signed)
Adult Psychoeducational Group Note  Date:  11/28/2012 Time:  0900  Group Topic/Focus:  Self Inventory Review  Participation Level:  Active  Participation Quality:  Appropriate and Attentive  Affect:  Anxious, Blunted and Depressed  Cognitive:  Appropriate and Oriented  Insight: Appropriate  Engagement in Group:  Engaged  Modes of Intervention:  Discussion  Additional Comments:    Barbette Merino, ERIC Shari Prows 11/28/2012, 9:56 AM

## 2012-11-28 NOTE — ED Provider Notes (Signed)
Medical screening examination/treatment/procedure(s) were performed by non-physician practitioner and as supervising physician I was immediately available for consultation/collaboration.    Matvey Llanas L Luz Burcher, MD 11/28/12 1506 

## 2012-11-28 NOTE — Progress Notes (Signed)
Patient ID: CAMERYN SCHUM, female   DOB: 1971/08/20, 42 y.o.   MRN: [id number] D)  Has been tired this evening, was sleeping soundly during group and missed it, but came out later for hs meds.  Still having intermittent nausea, was given ginger ale and zofran tonight, states helping and is feeling better than yesterday, but feels exhausted, no appetite.  Affect flat, sad. Denies SI at this time. A)  Will continue to monitor for safety, encourage fluids, cont.POC R)  Safety maintained.

## 2012-11-28 NOTE — Progress Notes (Signed)
Patient ID: Barbara Key, female   DOB: 1971/05/25, 42 y.o.   MRN: [id number] [facility] MD Progress Note  11/28/2012 2:38 PM Barbara Key  MRN:  [id number] Subjective:  Patient states that she has got her nights and days mixed up.  States that she was up most of the night and is sleepy during the day.  States that the Trazodone is not helping with sleep.   Diagnosis:   Axis I: Substance Abuse and Substance Induced Mood Disorder Axis II: Deferred Axis III:  Past Medical History  Diagnosis Date  . Thyroid disease   . Polysubstance (excluding opioids) dependence   . Hypothyroidism   . Mental disorder     suicidal Ideation  . Mood disorder, drug-induced   . Radial nerve palsy 03/2012    resolved  . Hepatitis C    Axis IV: economic problems, occupational problems, other psychosocial or environmental problems, problems related to social environment and problems with primary support group Axis V: 41-50 serious symptoms  ADL's:  Impaired  Sleep: Poor  Appetite:  Poor  Suicidal Ideation:  Denies Homicidal Ideation:  Denies  Psychiatric Specialty Exam: Review of Systems  Constitutional: Positive for malaise/fatigue (Patient states that she is feeling really tierd and thinks it may be because she is not eating).  Gastrointestinal: Positive for constipation (Patient states that she has not had a bowel movement in 5 days).       Patient states that she does not have an appetite   Psychiatric/Behavioral: Positive for depression and substance abuse. The patient is nervous/anxious.     Blood pressure 124/85, pulse 94, temperature 98.2 F (36.8 C), temperature source Oral, resp. rate 18, height 5' 6.25" (1.683 m), weight 76.204 kg (168 lb).Body mass index is 26.9 kg/(m^2).  General Appearance: Casual  Eye Contact::  Fair  Speech:  Slow  Volume:  Decreased  Mood:  Anxious and Depressed  Affect:  Depressed  Thought Process:  Coherent  Orientation:  Full (Time, Place, and Person)   Thought Content:  WDL  Suicidal Thoughts:  No  Homicidal Thoughts:  No  Memory:  Immediate;   Fair Recent;   Fair Remote;   Fair  Judgement:  Impaired  Insight:  Fair  Psychomotor Activity:  Decreased  Concentration:  Fair  Recall:  Fair  Akathisia:  No  Handed:  Right  AIMS (if indicated):     Assets:  Resilience  Sleep:  Number of Hours: 2.75   Current Medications: Current Facility-Administered Medications  Medication Dose Route Frequency Mckenzee Beem Last Rate Last Dose  . alum & mag hydroxide-simeth (MAALOX/MYLANTA) 200-200-20 MG/5ML suspension 30 mL  30 mL Oral Q4H PRN Sanjuana Kava, NP   30 mL at 11/27/12 0826  . busPIRone (BUSPAR) tablet 15 mg  15 mg Oral TID Sanjuana Kava, NP   15 mg at 11/28/12 1132  . cloNIDine (CATAPRES) tablet 0.1 mg  0.1 mg Oral BH-qamhs Sanjuana Kava, NP   0.1 mg at 11/28/12 9629   Followed by  . [START ON 11/30/2012] cloNIDine (CATAPRES) tablet 0.1 mg  0.1 mg Oral QAC breakfast Sanjuana Kava, NP      . dicyclomine (BENTYL) tablet 20 mg  20 mg Oral Q6H PRN Sanjuana Kava, NP   20 mg at 11/26/12 1727  . DULoxetine (CYMBALTA) DR capsule 30 mg  30 mg Oral Daily Sanjuana Kava, NP   30 mg at 11/28/12 5284  . gabapentin (NEURONTIN) capsule 400 mg  400 mg  Oral TID Sanjuana Kava, NP   400 mg at 11/28/12 1132  . hydrOXYzine (ATARAX/VISTARIL) tablet 25 mg  25 mg Oral Q6H PRN Sanjuana Kava, NP   25 mg at 11/27/12 0826  . ibuprofen (ADVIL,MOTRIN) tablet 800 mg  800 mg Oral Q6H PRN Cleotis Nipper, MD   800 mg at 11/28/12 0826  . levothyroxine (SYNTHROID, LEVOTHROID) tablet 100 mcg  100 mcg Oral QAC breakfast Sanjuana Kava, NP   100 mcg at 11/28/12 0608  . lidocaine (LIDODERM) 5 % 1 patch  1 patch Transdermal Q24H Sanjuana Kava, NP   1 patch at 11/27/12 1745  . loperamide (IMODIUM) capsule 2-4 mg  2-4 mg Oral PRN Sanjuana Kava, NP      . magnesium hydroxide (MILK OF MAGNESIA) suspension 30 mL  30 mL Oral Daily PRN Sanjuana Kava, NP      . methocarbamol (ROBAXIN) tablet  500 mg  500 mg Oral Q8H PRN Sanjuana Kava, NP   500 mg at 11/26/12 0902  . nicotine (NICODERM CQ - dosed in mg/24 hours) patch 21 mg  21 mg Transdermal Q0600 Sanjuana Kava, NP   21 mg at 11/28/12 0610  . ondansetron (ZOFRAN-ODT) disintegrating tablet 4 mg  4 mg Oral Q6H PRN Sanjuana Kava, NP   4 mg at 11/28/12 0608  . pantoprazole (PROTONIX) EC tablet 40 mg  40 mg Oral Daily Sanjuana Kava, NP   40 mg at 11/28/12 1610  . polyethylene glycol (MIRALAX / GLYCOLAX) packet 17 g  17 g Oral Daily Sanjuana Kava, NP   17 g at 11/28/12 0823  . risperiDONE (RISPERDAL) tablet 2 mg  2 mg Oral QHS Sanjuana Kava, NP   2 mg at 11/27/12 2130  . traZODone (DESYREL) tablet 50 mg  50 mg Oral QHS PRN Sanjuana Kava, NP   50 mg at 11/27/12 2130    Lab Results: No results found for this or any previous visit (from the past 48 hour(s)).  Physical Findings: AIMS: Facial and Oral Movements Muscles of Facial Expression: None, normal Lips and Perioral Area: None, normal Jaw: None, normal Tongue: None, normal,Extremity Movements Upper (arms, wrists, hands, fingers): None, normal Lower (legs, knees, ankles, toes): None, normal, Trunk Movements Neck, shoulders, hips: None, normal, Overall Severity Severity of abnormal movements (highest score from questions above): None, normal Incapacitation due to abnormal movements: None, normal Patient's awareness of abnormal movements (rate only patient's report): No Awareness, Dental Status Current problems with teeth and/or dentures?: No Does patient usually wear dentures?: No  CIWA:  CIWA-Ar Total: 8 COWS:  COWS Total Score: 7  Treatment Plan Summary: Daily contact with patient to assess and evaluate symptoms and progress in treatment Medication management  Plan:   Will continue current treatment plan  Medical Decision Making Problem Points:  Established problem, worsening (2), Review of last therapy session (1) and Review of psycho-social stressors (1) Data Points:   Review or order clinical lab tests (1) Review of new medications or change in dosage (2)  I certify that inpatient services furnished can reasonably be expected to improve the patient's condition.   Kathryne Sharper, MD 11/28/2012, 2:38 PM

## 2012-11-28 NOTE — Progress Notes (Addendum)
Patient ID: Barbara Key, female   DOB: 1971/03/16, 42 y.o.   MRN: [id number] D: Pt is awake and active on the unit this AM. Pt denies SI/HI and A/V hallucinations. Pt is participating in the milieu and is cooperative with staff. Pt rates their depression at 8 and hopelessness at 9. Pt's most recent COWS score was 7. Pt mood is blunted/despondant and her affect is sad/anxious. Pt writes that she plans to "eat well, go to meetings and exercise after discharge." Pt c/o of poor sleep at night but she is sleeping during much of the day. Pt also writes that her Cymbalta is too expensive. Pt also states that she has a court date on Wednesday that she must be in attendance for.   A: Writer utilized therapeutic communication, encouraged pt to discuss feelings with staff, sleep less throughout the day and administered medication per MD orders. Writer also encouraged pt to attend groups.  R: Pt is attending some groups, and tolerating medications well. Writer will continue to monitor. 15 minute checks are ongoing for safety.

## 2012-11-28 NOTE — Clinical Social Work Note (Signed)
[facility] Group Notes: (Clinical Social Work)   11/28/2012      Type of Therapy:  Group Therapy   Participation Level:  Did Not Attend    Ambrose Mantle, LCSW 11/28/2012, 11:11 AM

## 2012-11-28 NOTE — Progress Notes (Signed)
Adult Psychoeducational Group Note  Date:  11/28/2012 Time:  1315  Group Topic/Focus:  Making Healthy Choices:   The focus of this group is to help patients identify negative/unhealthy choices they were using prior to admission and identify positive/healthier coping strategies to replace them upon discharge. Relapse Prevention Planning:   The focus of this group is to define relapse and discuss the need for planning to combat relapse.  Participation Level:  Did Not Attend  Additional Comments: Pt did not get out of bed for group.  JENSEN, ERIC Shari Prows 11/28/2012, 2:24 PM

## 2012-11-29 LAB — RAPID STREP SCREEN (MED CTR MEBANE ONLY): Streptococcus, Group A Screen (Direct): NEGATIVE

## 2012-11-29 MED ORDER — QUETIAPINE FUMARATE 100 MG PO TABS
100.0000 mg | ORAL_TABLET | Freq: Every day | ORAL | Status: DC
  Administered 2012-11-29: 100 mg via ORAL
  Filled 2012-11-29 (×4): qty 1

## 2012-11-29 MED ORDER — GABAPENTIN 300 MG PO CAPS
600.0000 mg | ORAL_CAPSULE | Freq: Three times a day (TID) | ORAL | Status: DC
  Administered 2012-11-29 – 2012-11-30 (×4): 600 mg via ORAL
  Filled 2012-11-29 (×9): qty 2

## 2012-11-29 NOTE — Progress Notes (Signed)
Patient resting quietly with eyes closed. Respirations even and unlabored. No distress noted, Q 15 minute check continues as ordered to maintain safety.  

## 2012-11-29 NOTE — Progress Notes (Signed)
Adult Psychoeducational Group Note  Date:  11/29/2012 Time:  11:13 AM  Group Topic/Focus:  Healthy Communication:   The focus of this group is to discuss communication, barriers to communication, as well as healthy ways to communicate with others.  Participation Level:  Active  Participation Quality:  Attentive  Affect:  Appropriate  Cognitive:  Oriented  Insight: Good  Engagement in Group:  Engaged  Modes of Intervention:  Discussion and Education  Additional Comments:  Pt talked about finding coping skills for pain. 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  BELL, CLAIRE T 11/29/2012, 11:13 AM

## 2012-11-29 NOTE — Progress Notes (Signed)
[facility] LCSW Aftercare Discharge Planning Group Note  11/29/2012 8:45 AM  Participation Quality:  Appropriate  Affect: Appropriate  Cognitive: Appropriate  Insight:  Developing  Engagement in Group: Engaged  Modes of Intervention:  Exploration, Dissuccion, Support  Summary of Progress/Problems:  Pt denies both suicidal and homicidal ideation.  On a scale of 1 to 10 with ten being the most ever experienced, the patient rates depression at a 5 and anxiety at a 5. Patient reports desire to follow up with [facility] in [facility].  Plans to attend court date on 3.5.14  Dyane Dustman, Julious Payer

## 2012-11-29 NOTE — Progress Notes (Signed)
Patient ID: Barbara Key, female   DOB: 1971-08-25, 42 y.o.   MRN: [id number] D)  Has been in bed earlier part of the evening, had been nauseated, was sleeping, missed group,  Came out to med room, still somewhat nauseated, was given bentyl, as was too soon for zofran.  Pleasant, affect rather flat, sad, is tired.  Had a small snack and came back for other meds, went back to bed.  Denies thoughts of self harm, contracts for safety. A)  Will continue to monitor for safety , cont. POC, support R)  Safety maintained.

## 2012-11-29 NOTE — Progress Notes (Deleted)
Patient did attend the evening speaker AA meeting.  

## 2012-11-29 NOTE — Progress Notes (Signed)
Patient did not attend the evening speaker AA meeting. Pt was notified about group but remained in bed.

## 2012-11-29 NOTE — Tx Team (Signed)
Interdisciplinary Treatment Plan Update (Adult)  Date: 11/29/2012  Time Reviewed: 9:49 AM   Progress in Treatment: Attending groups: Yes Participating in groups: Yes Taking medication as prescribed:  Yes Tolerating medication:  Yes Family/Significant othe contact made: Not as yet Patient understands diagnosis: Yes Discussing patient identified problems/goals with staff: Yes Medical problems stabilized or resolved:  No, continued nausea, inability to eat Denies suicidal/homicidal ideation: Yes Patient has not harmed self or Others: Yes  New problem(s) identified: None Identified  Discharge Plan or Barriers:  CSW will refer patient to [facility] as per pt request  Additional comments: N/A  Reason for Continuation of Hospitalization Anxiety Depression Withdrawal symptoms   Estimated length of stay: 1 day  For review of initial/current patient goals, please see plan of care.  Attendees: Patient:     Family:     Physician:  Geoffery Lyons 11/29/2012 9:49 AM   Nursing:   Roswell Miners, RN 11/29/2012 9:49 AM   Clinical Social Worker Ronda Fairly 11/29/2012 9:49 AM   Other:  Norval Gable, NP Student 11/29/2012 9:49 AM   Other:   Robbie Louis, RN 11/29/2012 9:49 AM   Other:  Armandina Stammer, NO 11/29/2012 9:49 AM   Other:   11/29/2012 9:49 AM    Scribe for Treatment Team:   Carney Bern, LCSWA  11/29/2012 9:49 AM

## 2012-11-29 NOTE — Discharge Summary (Signed)
Physician Discharge Summary Note  Patient:  Barbara Key is an 42 y.o., female MRN:  [id number] DOB:  Nov 17, 1970 Patient phone:  [phone] (home)  Patient address:   [address] [place] [address],   Date of Admission:  11/25/2012  Date of Discharge: 11/29/12  Reason for Admission:  Opioid abuse  Discharge Diagnoses: Active Problems:   Opiate dependence   Opiate withdrawal  Review of Systems  Constitutional: Negative.   HENT: Negative.   Eyes: Negative.   Respiratory: Negative.   Cardiovascular: Negative.   Gastrointestinal: Negative.   Genitourinary: Negative.   Musculoskeletal: Negative.   Skin: Negative.   Neurological: Negative.   Endo/Heme/Allergies: Negative.   Psychiatric/Behavioral: Positive for depression (Satbilized with medication prior to discharge) and substance abuse. Negative for suicidal ideas, hallucinations and memory loss. The patient is nervous/anxious (Stabilized with medication prior to discharge) and has insomnia (Stabilized with medication prior to discharge).    Axis Diagnosis:   AXIS I:  Opiate dependence AXIS II:  Deferred AXIS III:   Past Medical History  Diagnosis Date  . Thyroid disease   . Polysubstance (excluding opioids) dependence   . Hypothyroidism   . Mental disorder     suicidal Ideation  . Mood disorder, drug-induced   . Radial nerve palsy 03/2012    resolved  . Hepatitis C   . Hypertension    AXIS IV:  Substance abuse issues AXIS V:  64  Level of Care:  OP  Hospital Course:  42 Y/O female that comes requesting opioid detox. She has been using heroin IV every day after she relapsed 8 months ago. In September she overdosed Intentionally trying to kill herself.. She had been here several times. She also abuses marijuana. She states that she suffers from depression. She has charges pending, has to go to court. Admits to aches, pains, migraine, nausea, vomiting, sweats, agitation. Has not been able to sleep. Was  not able to afford the Cymbalta, she quit taking it. Claim the Cymbalta was working.  Upon admission in this hospital and after admission assessment/evaluation, it was determined that Barbara Key was intoxicated and high on drugs and will need detoxification treatment to stabilize her system from drug intoxication and to combat the withdrawal symptoms of opiates.  Barbara Key was then started on clonidine protocol for her opiate detoxification. She was also enrolled in group counseling sessions and activities to learn coping skills that should help her after discharge to cope better and mange her substance abuse issues adequately. She also was enrolled/attended AA/NA meetings being offered and held on this unit. Barbara Key has some previous and or identifiable medical conditions that required treatment or monitoring. She received medication management and monitoring for all those symptoms and or issues. And while all these treatments were in progress, she was monitored closely for any potential problems that may arise as a result of and or during detoxification treatment. Patient tolerated her detoxification treatment without any significant adverse effects and or reactions presented.  Patient attended treatment team meeting this am and met with the treatment team members. Her reason for admission, symptoms, substance abuse issues, response to to treatment and discharge plans discussed. Patient endorsed that she is doing well and stable for discharge to pursue the next phase of her substance abuse treatment. It was then agreed upon between patient and the team that she will follow-up care at the [facility] on 12/06/12 at 09:00 am for counseling sessions and at the [facility] in  [place], [place] on 12/07/12 between the hours of 1:00 - 4 pm for routine psychiatric care and medication management. Barbara Key was instructed and informed that the appointment at the [facility] is a walk-in appointment and should make  every effort to make this appointment timely.   Upon discharge, patient adamantly denies suicidal, homicidal ideations, auditory, visual hallucinations, delusional thinking and or withdrawal symptoms. Patient left [facility] with all personal belongings in no apparent distress, Transportation per family. She received 14 days worth supply samples of her discharge medications.  Consults:  None  Significant Diagnostic Studies:  labs: CBC with diff, CMP, UDS, Toxicology tests.  Discharge Vitals:   Blood pressure 134/96, pulse 101, temperature 97.4 F (36.3 C), temperature source Oral, resp. rate 18, height 5' 6.25" (1.683 m), weight 76.204 kg (168 lb). Body mass index is 26.9 kg/(m^2). Lab Results:   Results for orders placed during the hospital encounter of 11/25/12 (from the past 72 hour(s))  RAPID STREP SCREEN     Status: None   Collection Time    11/29/12  7:44 PM      Result Value Range   Streptococcus, Group A Screen (Direct) NEGATIVE  NEGATIVE   Comment:            DUE TO INADEQUATE SENSITIVITY OF EIA     RAPID TESTS FOR GROUP A STREP (GAS)     IT IS RECOMMENDED THAT ALL NEGATIVE     RESULTS BE FOLLOWED BY A     GROUP A STREP PROBE.    Physical Findings: AIMS: Facial and Oral Movements Muscles of Facial Expression: None, normal Lips and Perioral Area: None, normal Jaw: None, normal Tongue: None, normal,Extremity Movements Upper (arms, wrists, hands, fingers): None, normal Lower (legs, knees, ankles, toes): None, normal, Trunk Movements Neck, shoulders, hips: None, normal, Overall Severity Severity of abnormal movements (highest score from questions above): None, normal Incapacitation due to abnormal movements: None, normal Patient's awareness of abnormal movements (rate only patient's report): No Awareness, Dental Status Current problems with teeth and/or dentures?: No Does patient usually wear dentures?: No  CIWA:  CIWA-Ar Total: 0 COWS:  COWS Total Score: 7  Psychiatric  Specialty Exam: See Psychiatric Specialty Exam and Suicide Risk Assessment completed by Attending Physician prior to discharge.  Discharge destination:  Home  Is patient on multiple antipsychotic therapies at discharge:  No   Has Patient had three or more failed trials of antipsychotic monotherapy by history:  No  Recommended Plan for Multiple Antipsychotic Therapies: NA     Medication List    STOP taking these medications       oxyCODONE 5 MG immediate release tablet  Commonly known as:  Oxy IR/ROXICODONE     risperiDONE 2 MG tablet  Commonly known as:  RISPERDAL      TAKE these medications     Indication   busPIRone 15 MG tablet  Commonly known as:  BUSPAR  Take 1 tablet (15 mg total) by mouth 3 (three) times daily. For anxiety   Indication:  Anxiety Disorder     cloNIDine 0.1 MG tablet  Commonly known as:  CATAPRES  Take 1 tablet (0.1 mg total) by mouth daily before breakfast. For hypertension   Indication:  High Blood Pressure     DULoxetine 30 MG capsule  Commonly known as:  CYMBALTA  Take 1 capsule (30 mg total) by mouth daily. For depression   Indication:  Major Depressive Disorder     gabapentin 300 MG capsule  Commonly  known as:  NEURONTIN  Take 2 capsules (600 mg total) by mouth 3 (three) times daily. For pain control/anxiety   Indication:  Agitation, Pain, Anxiety     ibuprofen 200 MG tablet  Commonly known as:  ADVIL,MOTRIN  Take 4 tablets (800 mg total) by mouth once. For pain   Indication:  Mild to Moderate Pain     levothyroxine 100 MCG tablet  Commonly known as:  SYNTHROID, LEVOTHROID  Take 1 tablet (100 mcg total) by mouth daily before breakfast. For thyroid hormone replacement   Indication:  Underactive Thyroid     lidocaine 5 %  Commonly known as:  LIDODERM  Place 2 patches onto the skin daily. Remove & Discard patch within 12 hours or as directed by MD: For pain   Indication:  Moderate pain     nicotine 21 mg/24hr patch  Commonly known  as:  NICODERM CQ - dosed in mg/24 hours  Place 1 patch onto the skin daily. Absolutely do not smoke with the patch on.  If you must smoke please take the patch off: For nicotine dependency   Indication:  Nicotine Addiction     pantoprazole 40 MG tablet  Commonly known as:  PROTONIX  Take 1 tablet (40 mg total) by mouth once. For acid reflux   Indication:  Gastroesophageal Reflux Disease     polyethylene glycol packet  Commonly known as:  MIRALAX / GLYCOLAX  Take 17 g by mouth 3 (three) times daily. For constipation   Indication:  Constipation, Treatment for the Prevention of Constipation     QUEtiapine 100 MG tablet  Commonly known as:  SEROQUEL  Take 1 tablet (100 mg total) by mouth at bedtime. For mood control   Indication:  Depressive Phase of Manic-Depression, Trouble Sleeping, Manic Phase of Manic-Depression     traZODone 50 MG tablet  Commonly known as:  DESYREL  Take 1 tablet (50 mg total) by mouth at bedtime as needed and may repeat dose one time if needed for sleep or depression. For depression/sleep   Indication:  Trouble Sleeping, Major Depressive Disorder       Follow-up Information   Follow up with [facility] On 12/06/2012. (Go to 9AM Group at [facility] on Monday 3.10.14)    Contact information:   [address] [place] [address] [facility] [phone]      Follow up with [facility]  On 12/07/2012. (Go to Walk [facility] Clinicat [facility] on Tuesday 3.11.14 between 1PM and 4 PM)    Contact information:   [address] [place] [address] [facility] [phone] FAX [phone]      Follow-up recommendations:  Activity:  as tolerated Other:  Keep all scheduled follow-up appointments as recommended.  Comments: Take all your medications as prescribed by your mental healthcare provider. Report any adverse effects and or reactions from your medicines to your outpatient provider promptly. Patient is instructed and cautioned to not engage in alcohol and or illegal drug  use while on prescription medicines. In the event of worsening symptoms, patient is instructed to call the crisis hotline, 911 and or go to the nearest ED for appropriate evaluation and treatment of symptoms. Follow-up with your primary care provider for your other medical issues, concerns and or health care needs.    Total Discharge Time:  Greater than 30 minutes  Signed: Sanjuana Kava, FNP, PMHNP-BC 11/30/2012, 4:07 PM

## 2012-11-29 NOTE — Plan of Care (Signed)
Problem: Ineffective individual coping Goal: STG: Patient will participate in after care plan Outcome: Progressing Patient requests referral to [facility] as she was enrolling there when sent to [facility] for detox  Problem: Alteration in mood & ability to function due to Goal: LTG-Patient demonstrates decreased signs of withdrawal (Patient demonstrates decreased signs of withdrawal to the point the patient is safe to return home and continue treatment in an outpatient setting)  Outcome: Not Met (add Reason) Patient still experiencing nausea, inability to eat, depression and anxiety.  Problem: Alteration in mood Goal: LTG-Pt's behavior demonstrates decreased signs of depression (Patient's behavior demonstrates decreased signs of depression to the point the patient is safe to return home and continue treatment in an outpatient setting)  Outcome: Not Met (add Reason) On a scale of 1 to 10 with ten being the most ever experienced, the patient rates depression at a 5 today during discharge planning group

## 2012-11-29 NOTE — Progress Notes (Signed)
D. Pt has been up and visible in milieu, still having complaints of nausea and still not being able to eat much and having abdominal cramping and feeling anxious. Pt has endorsed feelings of depression and hopelessness but has denied SI. Pt has been able to attend and to tolerate groups. Pt is reporting feelings of agitation and chilling. Pt has received all medications without incident. A. Support and encouragement provided. R. Will continue to monitor.

## 2012-11-29 NOTE — Progress Notes (Signed)
[facility] MD Progress Note  11/29/2012 2:15 PM Barbara Key  MRN:  [id number] Subjective:  Not feeling well. Has not been able to have a full meal. Still nauseated. She has not been able to sleep past few hours (used Seroquel in the past successfully) Still feeling anxious, jittery. Admits also to anxiety anticipating what is going to happen at court Wednesday. She is facing a number of felony charges. All this happened in the course of three months. She will be going to [facility], but she has to be fully detox before they admit her to their program Diagnosis:  Opioid dependence/withdrawal, Substance induced mood disorder  ADL's:  Intact  Sleep: Poor  Appetite:  Poor  Suicidal Ideation:  Plan:  denies Intent:  denies Means:  denies Homicidal Ideation:  Plan:  denies Intent:  denies Means:  denies AEB (as evidenced by):  Psychiatric Specialty Exam: Review of Systems  Constitutional: Positive for malaise/fatigue.  HENT: Negative.   Eyes: Negative.   Respiratory: Negative.   Cardiovascular: Negative.   Gastrointestinal: Positive for nausea and abdominal pain.  Genitourinary: Negative.   Musculoskeletal: Positive for myalgias and joint pain.  Skin: Negative.   Neurological: Positive for weakness.  Endo/Heme/Allergies: Negative.   Psychiatric/Behavioral: Positive for depression and substance abuse. The patient is nervous/anxious and has insomnia.     Blood pressure 129/86, pulse 106, temperature 98.7 F (37.1 C), temperature source Oral, resp. rate 20, height 5' 6.25" (1.683 m), weight 76.204 kg (168 lb).Body mass index is 26.9 kg/(m^2).  General Appearance: Fairly Groomed  Patent attorney::  Fair  Speech:  Clear and Coherent  Volume:  Normal  Mood:  Anxious, Depressed and worried, with aches, pains, stomach upset  Affect:  anxious  Thought Process:  Coherent and Goal Directed  Orientation:  Full (Time, Place, and Person)  Thought Content:  worries, concerns  Suicidal  Thoughts:  No  Homicidal Thoughts:  No  Memory:  Immediate;   Fair Recent;   Fair Remote;   Fair  Judgement:  Fair  Insight:  Present  Psychomotor Activity:  Restlessness  Concentration:  Fair  Recall:  Fair  Akathisia:  No  Handed:  Right  AIMS (if indicated):     Assets:  Desire for Improvement  Sleep:  Number of Hours: 3.25   Current Medications: Current Facility-Administered Medications  Medication Dose Route Frequency Provider Last Rate Last Dose  . alum & mag hydroxide-simeth (MAALOX/MYLANTA) 200-200-20 MG/5ML suspension 30 mL  30 mL Oral Q4H PRN Sanjuana Kava, NP   30 mL at 11/27/12 0826  . busPIRone (BUSPAR) tablet 15 mg  15 mg Oral TID Sanjuana Kava, NP   15 mg at 11/29/12 1151  . cloNIDine (CATAPRES) tablet 0.1 mg  0.1 mg Oral BH-qamhs Sanjuana Kava, NP   0.1 mg at 11/29/12 0849   Followed by  . [START ON 11/30/2012] cloNIDine (CATAPRES) tablet 0.1 mg  0.1 mg Oral QAC breakfast Sanjuana Kava, NP      . dicyclomine (BENTYL) tablet 20 mg  20 mg Oral Q6H PRN Sanjuana Kava, NP   20 mg at 11/28/12 2234  . DULoxetine (CYMBALTA) DR capsule 30 mg  30 mg Oral Daily Sanjuana Kava, NP   30 mg at 11/29/12 0847  . gabapentin (NEURONTIN) capsule 600 mg  600 mg Oral TID Rachael Fee, MD   600 mg at 11/29/12 1151  . hydrOXYzine (ATARAX/VISTARIL) tablet 25 mg  25 mg Oral Q6H PRN Nelda Marseille  Nwoko, NP   25 mg at 11/29/12 0341  . ibuprofen (ADVIL,MOTRIN) tablet 800 mg  800 mg Oral Q6H PRN Cleotis Nipper, MD   800 mg at 11/28/12 0826  . levothyroxine (SYNTHROID, LEVOTHROID) tablet 100 mcg  100 mcg Oral QAC breakfast Sanjuana Kava, NP   100 mcg at 11/29/12 1610  . lidocaine (LIDODERM) 5 % 1 patch  1 patch Transdermal Q24H Sanjuana Kava, NP   1 patch at 11/28/12 1702  . loperamide (IMODIUM) capsule 2-4 mg  2-4 mg Oral PRN Sanjuana Kava, NP      . magnesium citrate solution 1 Bottle  1 Bottle Oral Once Rachael Fee, MD      . magnesium hydroxide (MILK OF MAGNESIA) suspension 30 mL  30 mL Oral Daily  PRN Sanjuana Kava, NP      . methocarbamol (ROBAXIN) tablet 500 mg  500 mg Oral Q8H PRN Sanjuana Kava, NP   500 mg at 11/29/12 0341  . nicotine (NICODERM CQ - dosed in mg/24 hours) patch 21 mg  21 mg Transdermal Q0600 Sanjuana Kava, NP   21 mg at 11/29/12 0653  . ondansetron (ZOFRAN-ODT) disintegrating tablet 4 mg  4 mg Oral Q6H PRN Sanjuana Kava, NP   4 mg at 11/29/12 0341  . pantoprazole (PROTONIX) EC tablet 40 mg  40 mg Oral Daily Sanjuana Kava, NP   40 mg at 11/29/12 0847  . polyethylene glycol (MIRALAX / GLYCOLAX) packet 17 g  17 g Oral Daily Sanjuana Kava, NP   17 g at 11/29/12 0847  . QUEtiapine (SEROQUEL) tablet 100 mg  100 mg Oral QHS Rachael Fee, MD      . traZODone (DESYREL) tablet 50 mg  50 mg Oral QHS PRN,MR X 1 Rachael Fee, MD   50 mg at 11/28/12 2234    Lab Results: No results found for this or any previous visit (from the past 48 hour(s)).  Physical Findings: AIMS: Facial and Oral Movements Muscles of Facial Expression: None, normal Lips and Perioral Area: None, normal Jaw: None, normal Tongue: None, normal,Extremity Movements Upper (arms, wrists, hands, fingers): None, normal Lower (legs, knees, ankles, toes): None, normal, Trunk Movements Neck, shoulders, hips: None, normal, Overall Severity Severity of abnormal movements (highest score from questions above): None, normal Incapacitation due to abnormal movements: None, normal Patient's awareness of abnormal movements (rate only patient's report): No Awareness, Dental Status Current problems with teeth and/or dentures?: No Does patient usually wear dentures?: No  CIWA:  CIWA-Ar Total: 4 COWS:  COWS Total Score: 7  Treatment Plan Summary: Daily contact with patient to assess and evaluate symptoms and progress in treatment Medication management  Plan: Supportive approach/coping skills/relapse prevention           Continue the clonidine detox protocol            Monitor blood pressure            Cymbalta 30 mg  HS            Seroquel 100 mg HS (helped with sleep in the past)            Increase Neurontin (has used a higher dose in the past) Medical Decision Making Problem Points:  Review of psycho-social stressors (1) Data Points:  Review of medication regiment & side effects (2) Review of new medications or change in dosage (2)  I certify that inpatient services furnished can reasonably be expected  to improve the patient's condition.   LUGO,IRVING A 11/29/2012, 2:15 PM

## 2012-11-30 MED ORDER — LEVOTHYROXINE SODIUM 100 MCG PO TABS: 100.0000 ug | ORAL_TABLET | Freq: Every day | ORAL | Status: AC

## 2012-11-30 MED ORDER — BUSPIRONE HCL 15 MG PO TABS: 15.0000 mg | ORAL_TABLET | Freq: Three times a day (TID) | ORAL | Status: AC

## 2012-11-30 MED ORDER — LIDOCAINE 5 % EX PTCH
2.0000 | MEDICATED_PATCH | CUTANEOUS | Status: DC
  Filled 2012-11-30: qty 8

## 2012-11-30 MED ORDER — TRAZODONE HCL 50 MG PO TABS: 50.0000 mg | ORAL_TABLET | Freq: Every evening | ORAL | Status: AC | PRN

## 2012-11-30 MED ORDER — CLONIDINE HCL 0.1 MG PO TABS: 0.1000 mg | ORAL_TABLET | Freq: Every day | ORAL | Status: AC

## 2012-11-30 MED ORDER — TRAZODONE HCL 50 MG PO TABS: 50.0000 mg | ORAL_TABLET | Freq: Every evening | ORAL | Status: DC | PRN

## 2012-11-30 MED ORDER — POLYETHYLENE GLYCOL 3350 17 G PO PACK: 17.0000 g | PACK | Freq: Three times a day (TID) | ORAL | Status: AC

## 2012-11-30 MED ORDER — NICOTINE 21 MG/24HR TD PT24: 1.0000 | MEDICATED_PATCH | Freq: Every day | TRANSDERMAL | Status: AC

## 2012-11-30 MED ORDER — PANTOPRAZOLE SODIUM 40 MG PO TBEC
40.0000 mg | DELAYED_RELEASE_TABLET | Freq: Once | ORAL | Status: AC
Start: 2012-11-30 — End: ?

## 2012-11-30 MED ORDER — QUETIAPINE FUMARATE 100 MG PO TABS: 100.0000 mg | ORAL_TABLET | Freq: Every day | ORAL | Status: DC

## 2012-11-30 MED ORDER — LEVOTHYROXINE SODIUM 100 MCG PO TABS: 100.0000 ug | ORAL_TABLET | Freq: Every day | ORAL | Status: DC

## 2012-11-30 MED ORDER — QUETIAPINE FUMARATE 100 MG PO TABS: 100.0000 mg | ORAL_TABLET | Freq: Every day | ORAL | Status: AC

## 2012-11-30 MED ORDER — GABAPENTIN 300 MG PO CAPS: 600.0000 mg | ORAL_CAPSULE | Freq: Three times a day (TID) | ORAL | Status: AC

## 2012-11-30 MED ORDER — LIDOCAINE 5 % EX PTCH: 2.0000 | MEDICATED_PATCH | CUTANEOUS | Status: AC

## 2012-11-30 MED ORDER — IBUPROFEN 200 MG PO TABS: 800.0000 mg | ORAL_TABLET | Freq: Once | ORAL | Status: AC

## 2012-11-30 MED ORDER — GABAPENTIN 600 MG PO TABS
600.0000 mg | ORAL_TABLET | Freq: Three times a day (TID) | ORAL | Status: DC
  Filled 2012-11-30: qty 42

## 2012-11-30 MED ORDER — DULOXETINE HCL 30 MG PO CPEP: 30.0000 mg | ORAL_CAPSULE | Freq: Every day | ORAL | Status: DC

## 2012-11-30 NOTE — Progress Notes (Signed)
[facility] Adult Case Management Discharge Plan :  Will you be returning to the same living situation after discharge: Yes,  with boyfriend At discharge, do you have transportation home?:Yes,  friend is providing ride Do you have the ability to pay for your medications:Yes,  at reduced cost through [facility]  Release of information consent forms completed and in the chart;  Patient's signature needed at discharge.  Patient to Follow up at: Follow-up Information   Follow up with [facility] On 12/02/2012. (Go to 9AM Group at [facility] on Thursday 3.6.14)    Contact information:   [address] [place] [address] [facility] [phone]      Follow up with [facility]  On 12/02/2012. (Go to [facility] In Clinicat [facility] on Thursday 3.6.14 between 1PM and 4 PM)    Contact information:   [address] [place] [address] [facility] [phone] FAX [phone]      Patient denies SI/HI:   Yes,  denies both    Safety Planning and Suicide Prevention discussed:  Yes,  with patient verses patient's boyfriend as he was sick to stomach when he came to pick patient up at discharge.   Clide Dales 11/30/2012, 10:03 AM

## 2012-11-30 NOTE — Progress Notes (Signed)
  11/29/2012  1:15 PM   Type of Therapy:  Group Therapy 1:15 to 2:30 PM  Participation Level: Did Not Attend   Harrill, Catherine Campbell 

## 2012-11-30 NOTE — Progress Notes (Signed)
[facility] LCSW Aftercare Discharge Planning Group Note  11/30/2012 9:52 AM  Participation Quality:  Intrusive and Sharing  Affect:  Anxious  Cognitive:  Alert and Oriented  Insight:  Improving  Engagement in Group:  Limited  Modes of Intervention:  Clarification, Exploration and Support  Summary of Progress/Problems: Pt denies both suicidal and homicidal ideation.  On a scale of 1 to 10 with ten being the most ever experienced, the patient rates depression at a 2 and anxiety at a 5or6.Patient reports she is ready for discharge although somewhat appropriately anxious re court tomorrow.  [facility].  Has ride will need estimated discharge time.     Clide Dales 11/30/2012, 9:52 AM

## 2012-11-30 NOTE — Progress Notes (Signed)
Patient denies SI/HI and A/V hallucinations; patient discharged per physician order; no other orders were given for constipation per physician after it was addressed with him; patient was given samples, prescription, and copy of AVS after it was reviewed with the patient; patient verbalized that she received all belongings; patient had no other questions or concerns at this time; patient left the unit ambulatory

## 2012-11-30 NOTE — [facility] Suicide Risk Assessment (Signed)
Suicide Risk Assessment  Discharge Assessment     Demographic Factors:  Caucasian  Mental Status Per Nursing Assessment::   On Admission:  Self-harm thoughts  Current Mental Status by Physician: In full contact with reality. There are no suicidal ideas, plans or intent. Her mood is euthymic, her affect is appropriate. She will go to court tomorrow. She hopes to be able to go to [facility] afterwards.   Loss Factors: Loss of significant relationship and Legal issues  Historical Factors: NA  Risk Reduction Factors:   Responsible for children under 65 years of age, Sense of responsibility to family and Living with another person, especially a relative  Continued Clinical Symptoms:  Depression:   Comorbid alcohol abuse/dependence Alcohol/Substance Abuse/Dependencies  Cognitive Features That Contribute To Risk: None identified   Suicide Risk:  Minimal: No identifiable suicidal ideation.  Patients presenting with no risk factors but with morbid ruminations; may be classified as minimal risk based on the severity of the depressive symptoms  Discharge Diagnoses:   AXIS I:  Opiate Dependence/withdrawal AXIS II:  Deferred AXIS III:   Past Medical History  Diagnosis Date  . Thyroid disease   . Polysubstance (excluding opioids) dependence   . Hypothyroidism   . Mental disorder     suicidal Ideation  . Mood disorder, drug-induced   . Radial nerve palsy 03/2012    resolved  . Hepatitis C   . Hypertension    AXIS IV:  problems related to legal system/crime AXIS V:  61-70 mild symptoms  Plan Of Care/Follow-up recommendations:  Activity:  as tolerated Diet:  regular Follow up [facility] Is patient on multiple antipsychotic therapies at discharge:  No   Has Patient had three or more failed trials of antipsychotic monotherapy by history:  No  Recommended Plan for Multiple Antipsychotic Therapies: N/A   LUGO,IRVING A 11/30/2012, 11:06 AM

## 2012-12-03 NOTE — Progress Notes (Signed)
Patient Discharge Instructions:  After Visit Summary (AVS):   Faxed to:  12/03/12 Discharge Summary Note:   Faxed to:  12/03/12 Psychiatric Admission Assessment Note:   Faxed to:  12/03/12 Suicide Risk Assessment - Discharge Assessment:   Faxed to:  12/03/12 Faxed/Sent to the Next Level Care provider:  12/03/12 Faxed to [facility] @ [phone] Faxed to [facility] @ [phone]  Jerelene Redden, 12/03/2012, 4:05 PM

## 2013-09-02 ENCOUNTER — Emergency Department (HOSPITAL_COMMUNITY)
Admission: EM | Admit: 2013-09-02 | Discharge: 2013-09-02 | Disposition: A | Discharge status: Home / Self Care | Payer: Self-pay | Attending: Emergency Medicine | Admitting: Emergency Medicine

## 2013-09-02 ENCOUNTER — Encounter (HOSPITAL_COMMUNITY): Payer: Self-pay | Admitting: Emergency Medicine

## 2013-09-02 DIAGNOSIS — R109 Unspecified abdominal pain: Secondary | ICD-10-CM | POA: Insufficient documentation

## 2013-09-02 DIAGNOSIS — Z8669 Personal history of other diseases of the nervous system and sense organs: Secondary | ICD-10-CM | POA: Insufficient documentation

## 2013-09-02 DIAGNOSIS — F32A Depression, unspecified: Secondary | ICD-10-CM

## 2013-09-02 DIAGNOSIS — Z8619 Personal history of other infectious and parasitic diseases: Secondary | ICD-10-CM | POA: Insufficient documentation

## 2013-09-02 DIAGNOSIS — E039 Hypothyroidism, unspecified: Secondary | ICD-10-CM | POA: Insufficient documentation

## 2013-09-02 DIAGNOSIS — G8929 Other chronic pain: Secondary | ICD-10-CM

## 2013-09-02 DIAGNOSIS — F489 Nonpsychotic mental disorder, unspecified: Secondary | ICD-10-CM

## 2013-09-02 DIAGNOSIS — M79609 Pain in unspecified limb: Secondary | ICD-10-CM | POA: Insufficient documentation

## 2013-09-02 DIAGNOSIS — F17200 Nicotine dependence, unspecified, uncomplicated: Secondary | ICD-10-CM | POA: Insufficient documentation

## 2013-09-02 DIAGNOSIS — F329 Major depressive disorder, single episode, unspecified: Secondary | ICD-10-CM | POA: Insufficient documentation

## 2013-09-02 DIAGNOSIS — Z79899 Other long term (current) drug therapy: Secondary | ICD-10-CM | POA: Insufficient documentation

## 2013-09-02 DIAGNOSIS — F3289 Other specified depressive episodes: Secondary | ICD-10-CM | POA: Insufficient documentation

## 2013-09-02 DIAGNOSIS — I10 Essential (primary) hypertension: Secondary | ICD-10-CM | POA: Insufficient documentation

## 2013-09-02 LAB — URINALYSIS, ROUTINE W REFLEX MICROSCOPIC
Nitrite: NEGATIVE
Specific Gravity, Urine: 1.012 (ref 1.005–1.030)
Urobilinogen, UA: 0.2 mg/dL (ref 0.0–1.0)

## 2013-09-02 LAB — CBC WITH DIFFERENTIAL/PLATELET
Basophils Absolute: 0.1 10*3/uL (ref 0.0–0.1)
Eosinophils Absolute: 0.4 10*3/uL (ref 0.0–0.7)
Eosinophils Relative: 6 % — ABNORMAL HIGH (ref 0–5)
Lymphs Abs: 2.6 10*3/uL (ref 0.7–4.0)
MCH: 35.3 pg — ABNORMAL HIGH (ref 26.0–34.0)
MCV: 102.9 fL — ABNORMAL HIGH (ref 78.0–100.0)
Platelets: 229 10*3/uL (ref 150–400)
RDW: 16.4 % — ABNORMAL HIGH (ref 11.5–15.5)

## 2013-09-02 LAB — COMPREHENSIVE METABOLIC PANEL
ALT: 70 U/L — ABNORMAL HIGH (ref 0–35)
Calcium: 9.8 mg/dL (ref 8.4–10.5)
GFR calc Af Amer: 85 mL/min — ABNORMAL LOW (ref >90)
Glucose, Bld: 101 mg/dL — ABNORMAL HIGH (ref 70–99)
Sodium: 135 mEq/L (ref 135–145)
Total Protein: 8.6 g/dL — ABNORMAL HIGH (ref 6.0–8.3)

## 2013-09-02 LAB — URINE MICROSCOPIC-ADD ON

## 2013-09-02 MED ORDER — LEVOTHYROXINE SODIUM 100 MCG PO TABS: 100.0000 ug | ORAL_TABLET | Freq: Every day | ORAL | Status: AC

## 2013-09-02 MED ORDER — DULOXETINE HCL 20 MG PO CPEP: 20.0000 mg | ORAL_CAPSULE | Freq: Every day | ORAL | Status: AC

## 2013-09-02 MED ORDER — GABAPENTIN 300 MG PO CAPS: 300.0000 mg | ORAL_CAPSULE | Freq: Three times a day (TID) | ORAL | Status: AC

## 2013-09-02 NOTE — ED Provider Notes (Signed)
42 year old female, history of mental health disease, currently seeing a physician for this, we'll see them on Monday. She has been out of her medication for some time. She has missed increasing depression but no suicidal thoughts. She denies any significant pain except for chronic abdominal pain related to her hepatitis. On exam the patient has a flat affect but denies suicidal flights or hallucinations, is not responding to interval stimuli, has clear heart and lung sounds without murmurs rubs or gallops, no distress, soft abdomen with no guarding. The patient will be stable for discharge with refills on her medications until she can followup with her family doctor.  I saw and evaluated the patient, reviewed the resident's note and I agree with the findings and plan.   Vida Roller, MD 09/02/13 [phone]

## 2013-09-02 NOTE — ED Notes (Addendum)
Pt was sent here by mental health clinic for multiple issues. She originally went to have a refill on her cymbalta and neurontin. Pt was released from prison on 9/11 and states she has not taken her synthroid and depression meds since then.  Mentally, pt states she is depressed by denies SI/HI.  Pt also c/o RUQ pain and repeatedly states she has "liver problems", stating she has hx of hep C.  Pt also states she took a shot of heroin last night b/c the pain was so great.

## 2013-09-02 NOTE — ED Provider Notes (Signed)
CSN: [id number]     Arrival date & time 09/02/13  1414 History   First MD Initiated Contact with Patient 09/02/13 1653     Chief Complaint  Patient presents with  . Abdominal Pain  . Depression   (Consider location/radiation/quality/duration/timing/severity/associated sxs/prior Treatment) HPI Comments: 42 year old female past medical history significant for depression, mood disorder, substance abuse, hypothyroidism comes in with mental health concern. Patient was sent here from [facility] told she had mental health screening. Patient endorses depression for many years. She's recently been released from prison which time she had stopped taking Cymbalta, Neurontin, Synthroid. Feels her depression worse since then. Went to get medications refilled today at which [facility] instructed her to come to ED when she informed him she was depressed. Denies a SI/HI. Denies hallucinations. Patient also with multiple other complaints which are chronic. States she's had pain in bilateral feet. Has had pain since surgery several years ago. Is unchanged at this time.  Patient is a 42 y.o. female presenting with mental health disorder.  Mental Health Problem Presenting symptoms: depression   Presenting symptoms: no agitation, no suicidal thoughts, no suicidal threats and no suicide attempt   Patient accompanied by: none. Degree of incapacity (severity):  Mild Onset quality:  Unable to specify Duration: years of depression. Timing:  Constant Progression:  Unchanged Chronicity:  Chronic Context: stressful life event   Treatment compliance:  Some of the time Associated symptoms: abdominal pain (chronic)   Associated symptoms: no chest pain and no headaches     Past Medical History  Diagnosis Date  . Thyroid disease   . Polysubstance (excluding opioids) dependence   . Hypothyroidism   . Mental disorder     suicidal Ideation  . Mood disorder, drug-induced   . Radial nerve palsy 03/2012    resolved  . Hepatitis C    . Hypertension    Past Surgical History  Procedure Laterality Date  . Foot surgery  3/13; 06/12    to repair a deformity: plate and screws to Rt then Lt foot  . Tubal ligation    . Endometrial ablation    . Flexible sigmoidoscopy  09/11/2012    Procedure: FLEXIBLE SIGMOIDOSCOPY;  Surgeon: Barrie Folk, MD;  Location: [facility];  Service: Endoscopy;  Laterality: N/A;   Family History  Problem Relation Age of Onset  . Cancer Mother   . Alcohol abuse Father   . Hypertension Father    History  Substance Use Topics  . Smoking status: Current Every Day Smoker -- 0.50 packs/day for 26 years    Types: Cigarettes  . Smokeless tobacco: Never Used  . Alcohol Use: Yes     Comment: former   OB History   Grav Para Term Preterm Abortions TAB SAB Ect Mult Living                 Review of Systems  Constitutional: Negative for fever.  Respiratory: Negative for shortness of breath.   Cardiovascular: Negative for chest pain.  Gastrointestinal: Positive for abdominal pain (chronic).  Genitourinary: Negative for dysuria.  Musculoskeletal: Positive for arthralgias (feet). Negative for back pain.  Skin: Negative for rash.  Neurological: Negative for headaches.  Psychiatric/Behavioral: Negative for suicidal ideas and agitation.  All other systems reviewed and are negative.    Allergies  Review of patient's allergies indicates no known allergies.  Home Medications   Current Outpatient Rx  Name  Route  Sig  Dispense  Refill  . busPIRone (BUSPAR) 15 MG tablet  Oral   Take 1 tablet (15 mg total) by mouth 3 (three) times daily. For anxiety   90 tablet   0   . cloNIDine (CATAPRES) 0.1 MG tablet   Oral   Take 1 tablet (0.1 mg total) by mouth daily before breakfast. For hypertension   30 tablet   0   . DULoxetine (CYMBALTA) 20 MG capsule   Oral   Take 1 capsule (20 mg total) by mouth daily.   30 capsule   0   . gabapentin (NEURONTIN) 300 MG capsule   Oral   Take 2 capsules  (600 mg total) by mouth 3 (three) times daily. For pain control/anxiety   180 capsule   0   . gabapentin (NEURONTIN) 300 MG capsule   Oral   Take 1 capsule (300 mg total) by mouth 3 (three) times daily.   30 capsule   0   . ibuprofen (ADVIL,MOTRIN) 200 MG tablet   Oral   Take 4 tablets (800 mg total) by mouth once. For pain   30 tablet      . levothyroxine (SYNTHROID, LEVOTHROID) 100 MCG tablet   Oral   Take 1 tablet (100 mcg total) by mouth daily before breakfast. For thyroid hormone replacement   30 tablet   1   . levothyroxine (SYNTHROID, LEVOTHROID) 100 MCG tablet   Oral   Take 1 tablet (100 mcg total) by mouth daily before breakfast.   30 tablet   0   . lidocaine (LIDODERM) 5 %   Transdermal   Place 2 patches onto the skin daily. Remove & Discard patch within 12 hours or as directed by MD: For pain   30 patch   0   . nicotine (NICODERM CQ - DOSED IN MG/24 HOURS) 21 mg/24hr patch   Transdermal   Place 1 patch onto the skin daily. Absolutely do not smoke with the patch on.  If you must smoke please take the patch off: For nicotine dependency   14 patch   0   . pantoprazole (PROTONIX) 40 MG tablet   Oral   Take 1 tablet (40 mg total) by mouth once. For acid reflux         . polyethylene glycol (MIRALAX / GLYCOLAX) packet   Oral   Take 17 g by mouth 3 (three) times daily. For constipation   14 each      . QUEtiapine (SEROQUEL) 100 MG tablet   Oral   Take 1 tablet (100 mg total) by mouth at bedtime. For mood control   30 tablet   0   . traZODone (DESYREL) 50 MG tablet   Oral   Take 1 tablet (50 mg total) by mouth at bedtime as needed and may repeat dose one time if needed for sleep or depression. For depression/sleep   60 tablet   0    BP 123/88  Pulse 79  Temp(Src) 98 F (36.7 C) (Oral)  Resp 18  Wt 200 lb 6.4 oz (90.901 kg)  SpO2 98% Physical Exam  Nursing note and vitals reviewed. Constitutional: She is oriented to person, place, and time.  She appears well-developed and well-nourished.  HENT:  Head: Normocephalic and atraumatic.  Eyes: EOM are normal. Pupils are equal, round, and reactive to light.  Neck: Normal range of motion.  Cardiovascular: Normal rate, regular rhythm and intact distal pulses.   Pulmonary/Chest: Effort normal and breath sounds normal. No respiratory distress.  Abdominal: Soft. She exhibits no distension. There is no tenderness.  Musculoskeletal: Normal range of motion.  Neurological: She is alert and oriented to person, place, and time. No cranial nerve deficit. She exhibits normal muscle tone. Coordination normal.  Skin: Skin is warm and dry.  Psychiatric: She has a normal mood and affect. Her behavior is normal. Judgment and thought content normal.    ED Course  Procedures (including critical care time) Labs Review Labs Reviewed  CBC WITH DIFFERENTIAL - Abnormal; Notable for the following:    RBC 3.85 (*)    MCV 102.9 (*)    MCH 35.3 (*)    RDW 16.4 (*)    Eosinophils Relative 6 (*)    All other components within normal limits  COMPREHENSIVE METABOLIC PANEL - Abnormal; Notable for the following:    Potassium 3.1 (*)    Glucose, Bld 101 (*)    Total Protein 8.6 (*)    AST 111 (*)    ALT 70 (*)    GFR calc non Af Amer 73 (*)    GFR calc Af Amer 85 (*)    All other components within normal limits  URINALYSIS, ROUTINE W REFLEX MICROSCOPIC - Abnormal; Notable for the following:    APPearance HAZY (*)    Hgb urine dipstick SMALL (*)    Leukocytes, UA TRACE (*)    All other components within normal limits  URINE MICROSCOPIC-ADD ON - Abnormal; Notable for the following:    Squamous Epithelial / LPF FEW (*)    Bacteria, UA FEW (*)    All other components within normal limits  URINE CULTURE  LIPASE, BLOOD   Imaging Review No results found.  EKG Interpretation   None       MDM   1. Depression   2. Mental health problem     Afebrile vital signs stable on arrival. Patient with  multiple musculoskeletal complaints which are all chronic. These include bilateral foot pain, left shin pain and left knee pain. On exam patient does have scarring from previous surgeries on bilateral feet with mild swelling. However there is no erythema, warmth, areas of fluctuance. Normal range of motion. Neurovascularly intact. No visible deformities or injuries noted. No tenderness palpation. Doubt any serious pathology. Likely chronic issues. Patient also with right upper quadrant pain. States she's had this pain for years. It is unchanged. Abdominal exam benign with no signs of peritonitis, guarding or rebound. Labs within normal limits doubt serious intra-abdominal pathology.  Patient main reason for presentation was being sent by [facility]. Stated she needed mental health screening when she was attempting to fill her Cymbalta and Neurontin. Patient is answering appropriately. She does endorse depression which she's had for many years. States has been worse since stopping Synthroid Cymbalta. However patient denies any suicidal ideation, homicidal ideation or hallucinations. Has followup on Monday with new physician. Believe appropriate this time for discharge and followup on Monday. Will provide one week prescription of Cymbalta Neurontin and Synthroid. Patient's questions were answered and she was given return precautions. Patient voiced understanding and was discharged without further acute issues.   Bridgett Larsson, MD 09/02/13 [phone]

## 2013-09-02 NOTE — ED Notes (Signed)
MD at bedside. Dr. Post. 

## 2013-09-03 LAB — URINE CULTURE

## 2013-09-03 NOTE — ED Provider Notes (Signed)
I saw and evaluated the patient, reviewed the resident's note and I agree with the findings and plan.  Please see my separate note regarding my evaluation of the patient.     Vida Roller, MD 09/03/13 [phone]

## 2014-09-20 IMAGING — US US ABDOMEN COMPLETE
1 series · 14 of 25 positions shown · non-contrast
Comparison: None.

CLINICAL DATA: Abnormal LFTs, hepatitis

COMPLETE ABDOMINAL ULTRASOUND

[Series 1: us abdomen complete · 0.31mm/px · 14 of 80 slices shown]
[im 1/80]
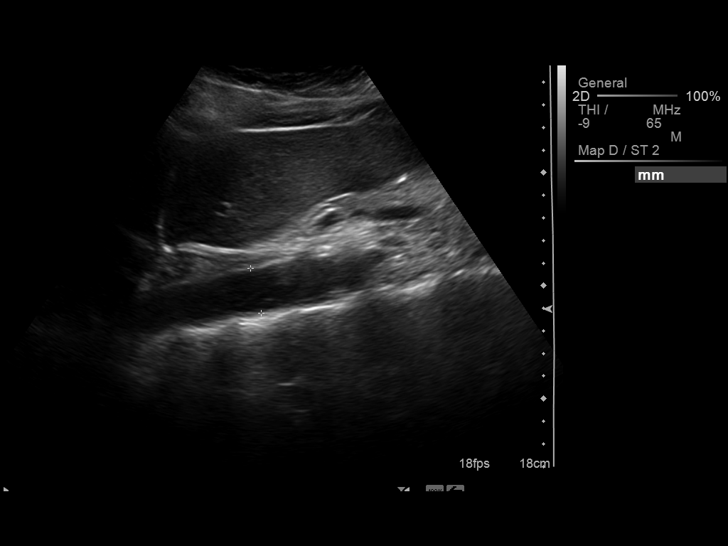
[im 7/80]
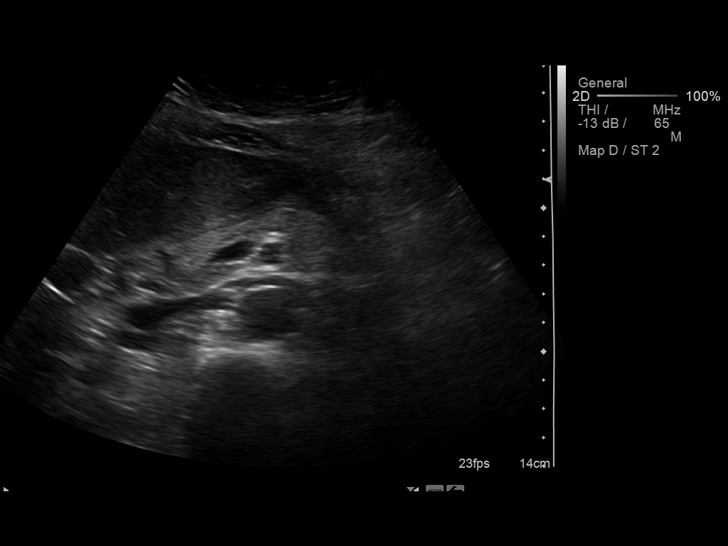
[im 14/80]
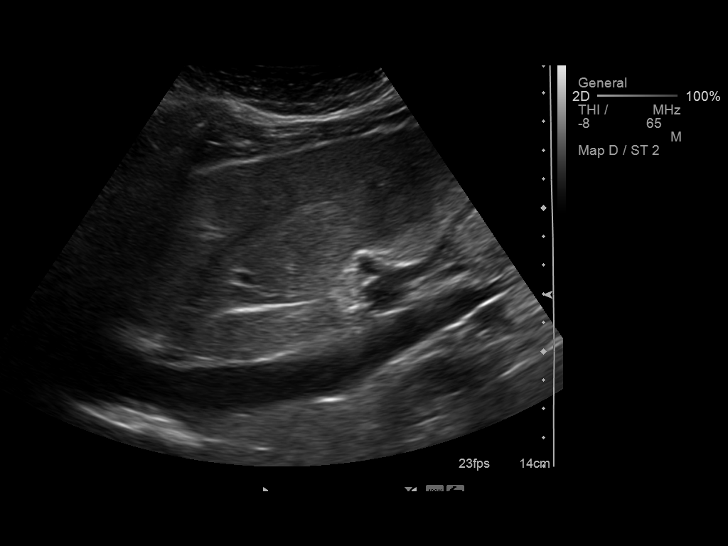
[im 20/80]
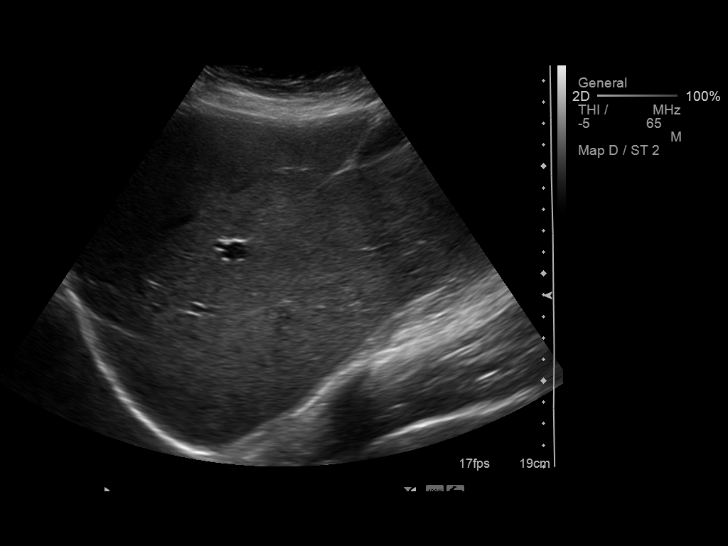
[im 27/80]
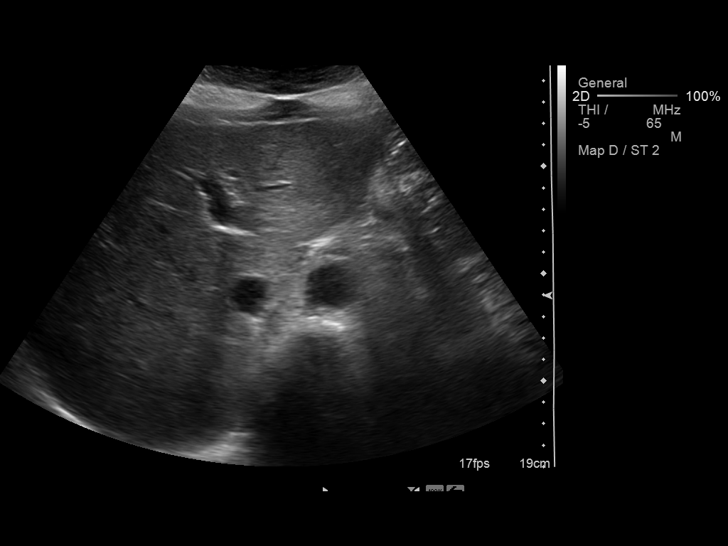
[im 30/80]
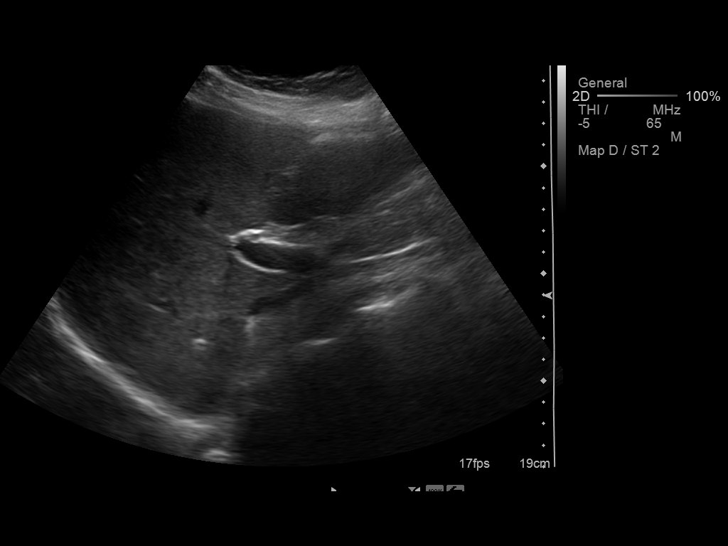
[im 37/80]
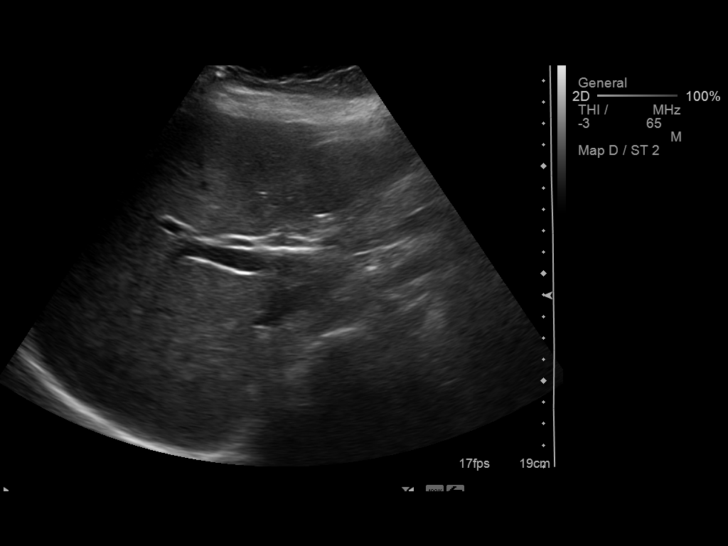
[im 43/80]
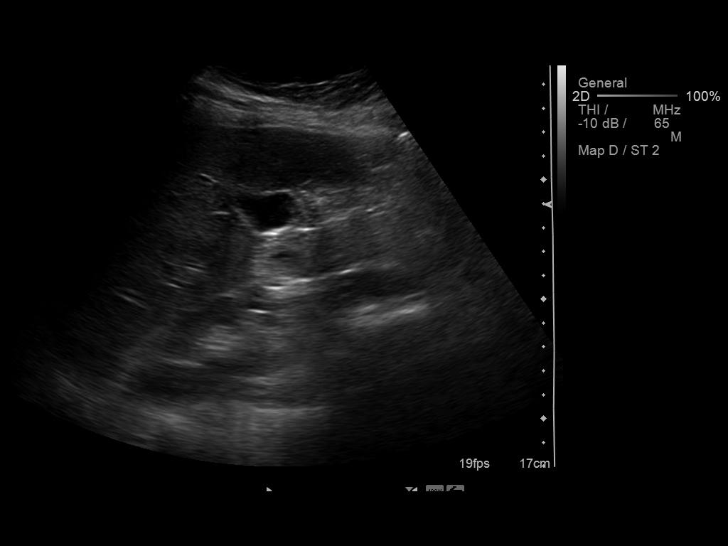
[im 50/80]
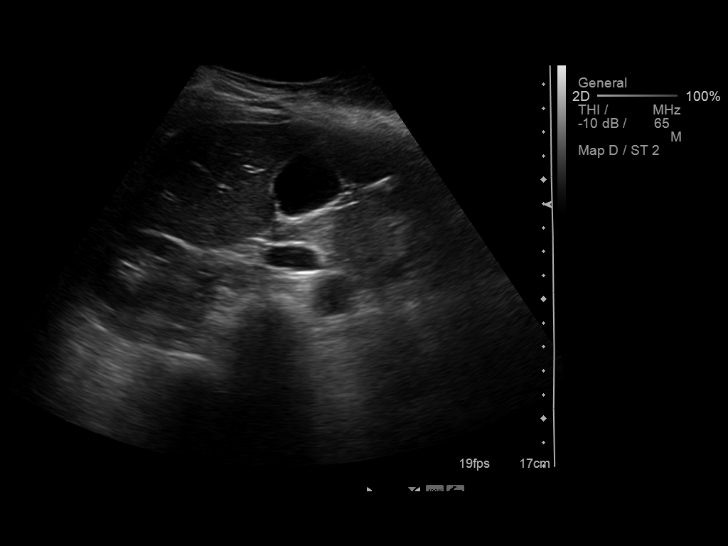
[im 53/80]
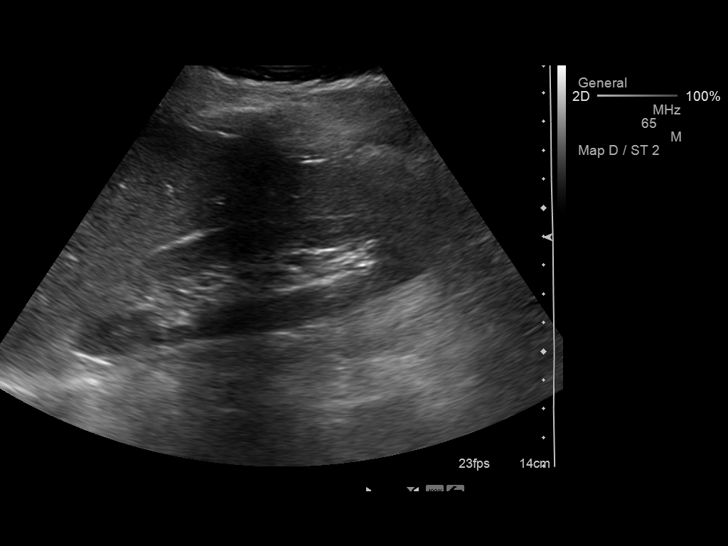
[im 60/80]
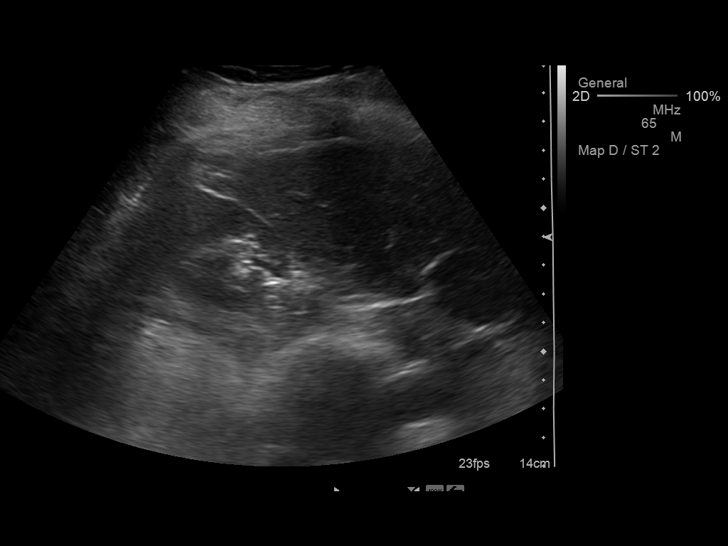
[im 66/80]
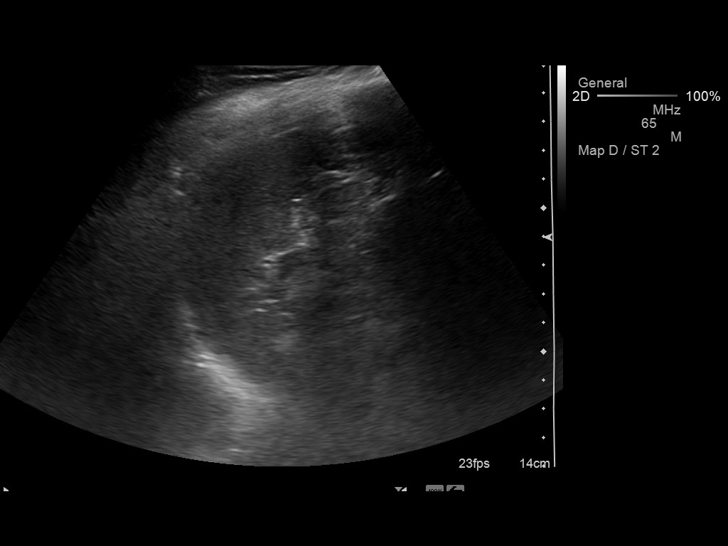
[im 73/80]
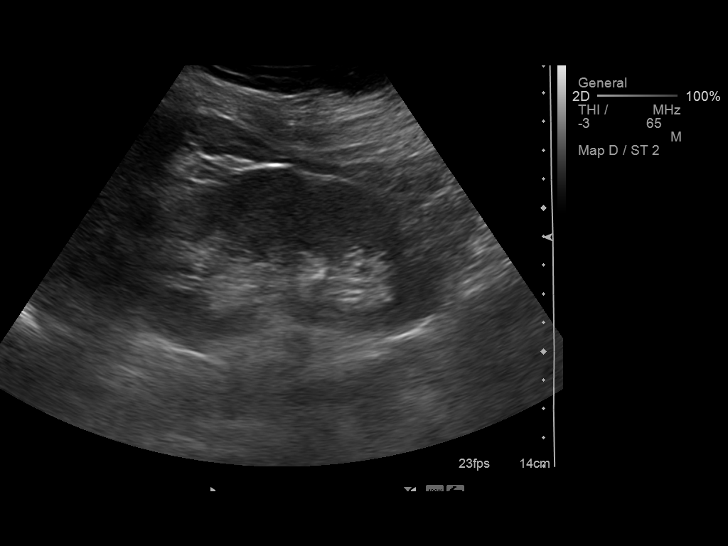
[im 80/80]
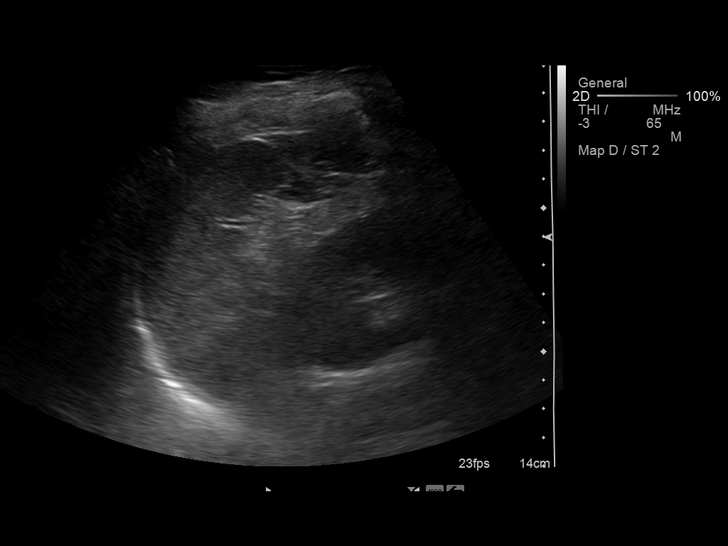

[14 of 25 positions shown; findings below may reference images not displayed]

FINDINGS: Gallbladder:  No gallstones, gallbladder wall thickening, or
pericholecystic fluid.  Negative sonographic Murphy's sign.

Common bile duct:  Measures 5 mm.

Liver:  No focal lesion identified.  Mildly increased parenchymal
echogenicity, suggesting hepatic steatosis.

IVC:  Appears normal.

Pancreas:  Visualized portions are grossly unremarkable.

Spleen:  Measures 5.1 cm.

Right Kidney:  Measures 13.5 cm.  No mass or hydronephrosis.

Left Kidney:  Measures 12.3 cm.  No mass or hydronephrosis.

Abdominal aorta:  No aneurysm identified.
IMPRESSION: Possible hepatic steatosis.

Otherwise negative abdominal ultrasound.

## 2014-09-25 IMAGING — CR DG FOOT COMPLETE 3+V*R*
3 series · 3 of 3 positions shown · non-contrast
Comparison: 04/02/2012

CLINICAL DATA: Pain and swelling at site of open reduction and
fixation.

RIGHT FOOT COMPLETE - 3+ VIEW

[t foot ap right]
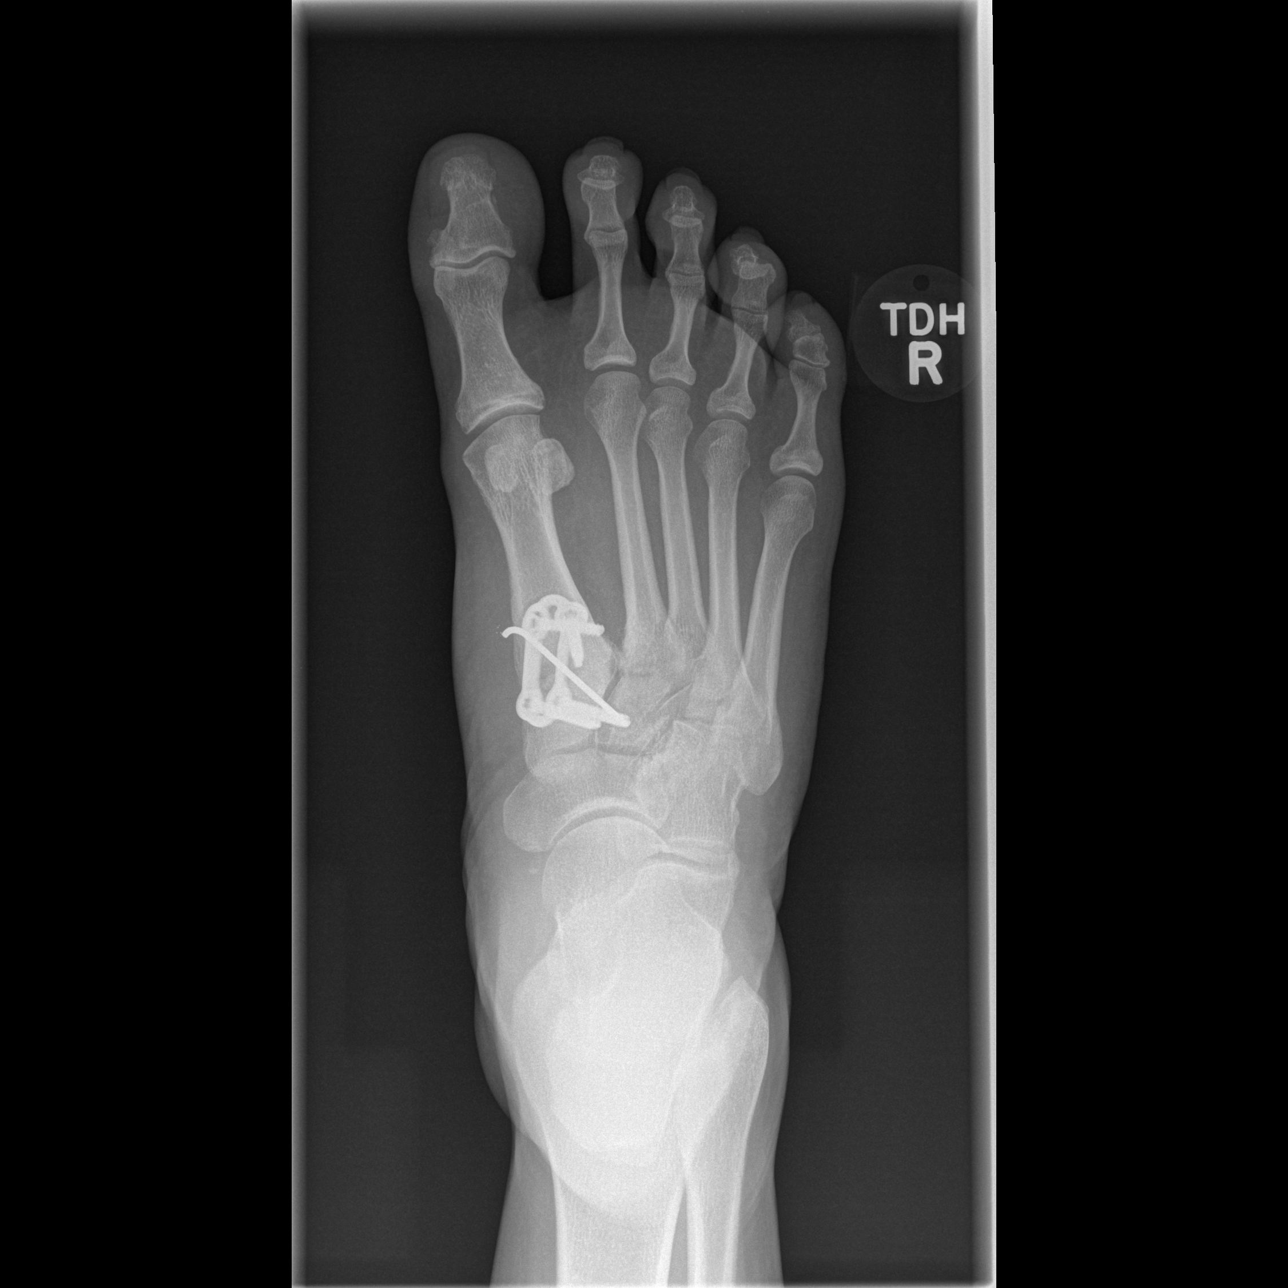

[t foot oblique right]
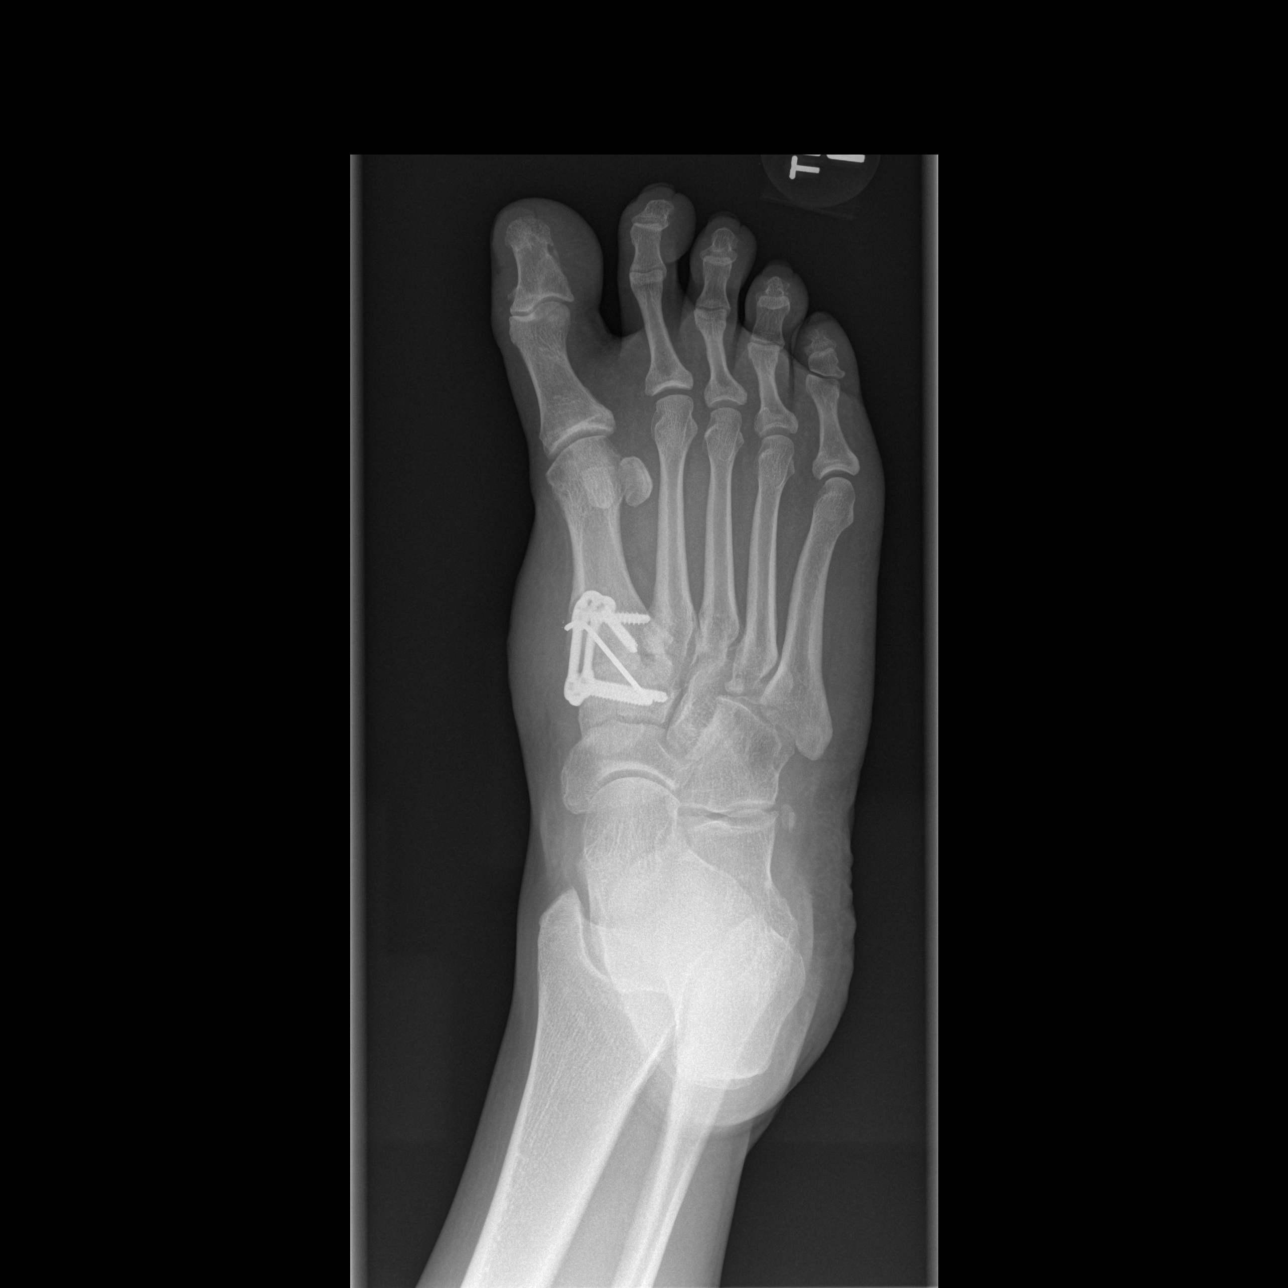

[t foot lat right]
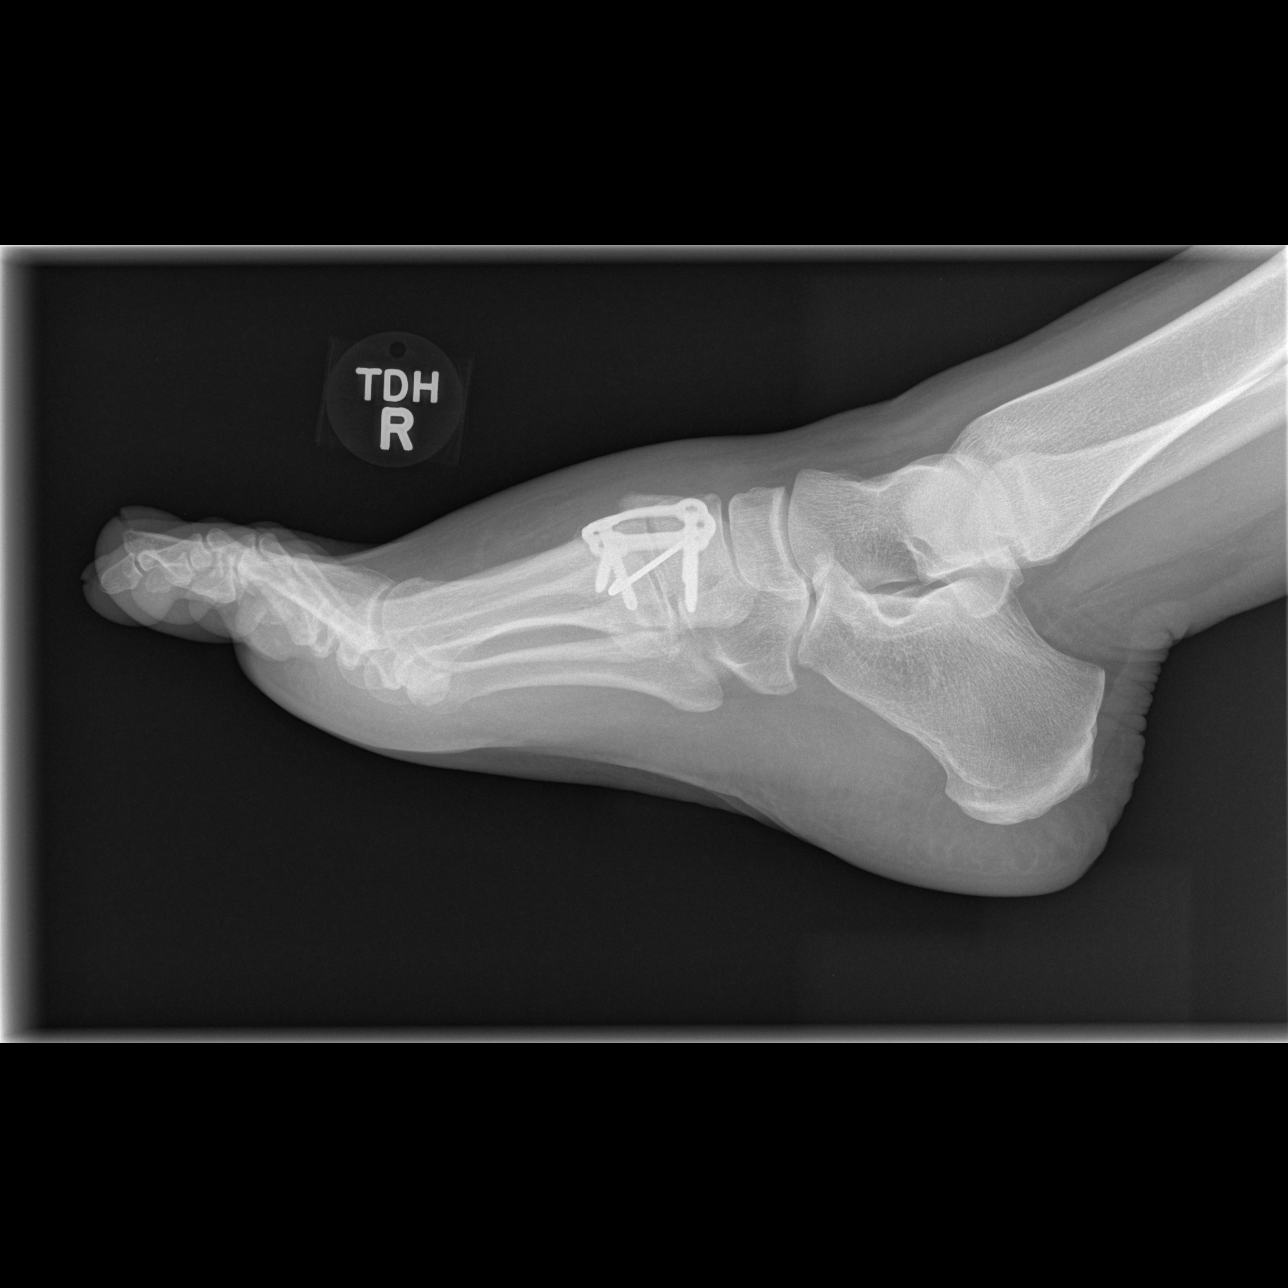

[3 of 3 positions shown; findings below may reference images not displayed]

FINDINGS: Prior open reduction internal fixation across the first
metatarsal and medial cuneiform.  Increasing lucency surrounding
the plate and screw construct particularly proximally. The findings
appear worse compared with [place].  Marked dorsal soft tissue
swelling.  Healed fracture across the base of the second
metatarsal.
IMPRESSION: Increasing lucency surrounding the hardware associated with ORIF
medial cuneiform and first metatarsal.  Superimposed infection not
excluded.  This area would be suboptimally evaluated with MR due to
susceptibility artifact.  Tissue sampling may be warranted.

## 2017-05-29 ENCOUNTER — Emergency Department (HOSPITAL_COMMUNITY)
Admission: EM | Admit: 2017-05-29 | Discharge: 2017-05-29 | Disposition: A | Discharge status: Home / Self Care | Payer: Self-pay | Attending: Emergency Medicine | Admitting: Emergency Medicine

## 2017-05-29 ENCOUNTER — Encounter (HOSPITAL_COMMUNITY): Payer: Self-pay | Admitting: Oncology

## 2017-05-29 DIAGNOSIS — N12 Tubulo-interstitial nephritis, not specified as acute or chronic: Secondary | ICD-10-CM | POA: Insufficient documentation

## 2017-05-29 DIAGNOSIS — Z79899 Other long term (current) drug therapy: Secondary | ICD-10-CM | POA: Insufficient documentation

## 2017-05-29 DIAGNOSIS — E039 Hypothyroidism, unspecified: Secondary | ICD-10-CM | POA: Insufficient documentation

## 2017-05-29 DIAGNOSIS — I10 Essential (primary) hypertension: Secondary | ICD-10-CM | POA: Insufficient documentation

## 2017-05-29 DIAGNOSIS — F17210 Nicotine dependence, cigarettes, uncomplicated: Secondary | ICD-10-CM | POA: Insufficient documentation

## 2017-05-29 LAB — URINALYSIS, ROUTINE W REFLEX MICROSCOPIC
Bilirubin Urine: NEGATIVE
GLUCOSE, UA: NEGATIVE mg/dL
KETONES UR: 5 mg/dL — AB
NITRITE: POSITIVE — AB
PH: 5 (ref 5.0–8.0)
PROTEIN: 30 mg/dL — AB
Specific Gravity, Urine: 1.03 (ref 1.005–1.030)

## 2017-05-29 LAB — RAPID URINE DRUG SCREEN, HOSP PERFORMED
AMPHETAMINES: NOT DETECTED
BENZODIAZEPINES: NOT DETECTED
Barbiturates: NOT DETECTED
Cocaine: POSITIVE — AB
Opiates: NOT DETECTED
TETRAHYDROCANNABINOL: POSITIVE — AB

## 2017-05-29 LAB — COMPREHENSIVE METABOLIC PANEL
ALBUMIN: 4.4 g/dL (ref 3.5–5.0)
ALK PHOS: 83 U/L (ref 38–126)
ALT: 14 U/L (ref 14–54)
ANION GAP: 10 (ref 5–15)
AST: 17 U/L (ref 15–41)
BUN: 15 mg/dL (ref 6–20)
CHLORIDE: 106 mmol/L (ref 101–111)
CO2: 26 mmol/L (ref 22–32)
Calcium: 9.5 mg/dL (ref 8.9–10.3)
Creatinine, Ser: 1.09 mg/dL — ABNORMAL HIGH (ref 0.44–1.00)
GFR calc non Af Amer: >60 mL/min (ref >60)
GLUCOSE: 97 mg/dL (ref 65–99)
POTASSIUM: 4.2 mmol/L (ref 3.5–5.1)
SODIUM: 142 mmol/L (ref 135–145)
Total Bilirubin: 0.4 mg/dL (ref 0.3–1.2)
Total Protein: 7.9 g/dL (ref 6.5–8.1)

## 2017-05-29 LAB — ETHANOL: Alcohol, Ethyl (B): <5 mg/dL (ref <5)

## 2017-05-29 LAB — CBC
HEMATOCRIT: 43.3 % (ref 36.0–46.0)
Hemoglobin: 14.2 g/dL (ref 12.0–15.0)
MCH: 32.4 pg (ref 26.0–34.0)
MCHC: 32.8 g/dL (ref 30.0–36.0)
MCV: 98.9 fL (ref 78.0–100.0)
PLATELETS: 233 10*3/uL (ref 150–400)
RBC: 4.38 MIL/uL (ref 3.87–5.11)
RDW: 14.4 % (ref 11.5–15.5)
WBC: 9.1 10*3/uL (ref 4.0–10.5)

## 2017-05-29 MED ORDER — CEPHALEXIN 500 MG PO CAPS: 500.0000 mg | ORAL_CAPSULE | Freq: Four times a day (QID) | ORAL | 0 refills | Status: AC

## 2017-05-29 MED ORDER — KETOROLAC TROMETHAMINE 60 MG/2ML IM SOLN
15.0000 mg | Freq: Once | INTRAMUSCULAR | Status: AC
  Administered 2017-05-29: 15 mg via INTRAMUSCULAR
  Filled 2017-05-29: qty 2

## 2017-05-29 MED ORDER — ACETAMINOPHEN 500 MG PO TABS
1000.0000 mg | ORAL_TABLET | Freq: Once | ORAL | Status: AC
  Administered 2017-05-29: 1000 mg via ORAL
  Filled 2017-05-29: qty 2

## 2017-05-29 NOTE — ED Provider Notes (Signed)
[facility]-EMERGENCY DEPT Provider Note   CSN: [id number] Arrival date & time: 05/29/17  1834     History   Chief Complaint Chief Complaint  Patient presents with  . Medical Clearance    HPI Barbara MusaJodie L Key is a 46 y.o. female.  46 yo F with a chief complaint of wanting to withdrawal from crack. Patient has been on a crack binge for at least the past couple weeks. She has not been eating or drinking or sleeping. She does not think that this is for mental illness but more from her continuous need to do cocaine. She denies any chest pain or shortness of breath. Denies abdominal pain. She is having some right flank pain. Associated when she urinates. Denies fevers or chills. She thinks maybe she has UTI.   The history is provided by the patient.  Illness  This is a new problem. The current episode started more than 2 days ago. The problem occurs constantly. The problem has been rapidly worsening. Pertinent negatives include no chest pain, no abdominal pain, no headaches and no shortness of breath. Nothing aggravates the symptoms. Nothing relieves the symptoms. She has tried nothing for the symptoms. The treatment provided no relief.    Past Medical History:  Diagnosis Date  . Hepatitis C   . Hypertension   . Hypothyroidism   . Mental disorder    suicidal Ideation  . Mood disorder, drug-induced (HCC)   . Polysubstance (excluding opioids) dependence (HCC)   . Radial nerve palsy 03/2012   resolved  . Thyroid disease     Patient Active Problem List   Diagnosis Date Noted  . Opiate dependence (HCC) 11/26/2012  . Opiate withdrawal (HCC) 11/26/2012  . Right foot pain 09/13/2012  . Constipation 09/10/2012  . Suicidal ideation 09/06/2012  . Acute hepatitis 09/05/2012  . Radial nerve palsy 04/20/2012  . Polysubstance dependence including opioid type drug, episodic abuse (HCC) 04/03/2012  . Drug-induced mood disorder(292.84) 04/03/2012  . Hypothyroid 04/03/2012    Past Surgical  History:  Procedure Laterality Date  . ENDOMETRIAL ABLATION    . FLEXIBLE SIGMOIDOSCOPY  09/11/2012   Procedure: FLEXIBLE SIGMOIDOSCOPY;  Surgeon: Barrie FolkJohn C Hayes, MD;  [facility];  Service: Endoscopy;  Laterality: N/A;  . FOOT SURGERY  3/13; 06/12   to repair a deformity: plate and screws to Rt then Lt foot  . TUBAL LIGATION      OB History    No data available       Home Medications    Prior to Admission medications   Medication Sig Start Date End Date Taking? Authorizing Provider  busPIRone (BUSPAR) 15 MG tablet Take 1 tablet (15 mg total) by mouth 3 (three) times daily. For anxiety 11/30/12   Armandina StammerNwoko, Agnes I, NP  cephALEXin (KEFLEX) 500 MG capsule Take 1 capsule (500 mg total) by mouth 4 (four) times daily. 05/29/17   Melene PlanFloyd, Eduardo Wurth, DO  cloNIDine (CATAPRES) 0.1 MG tablet Take 1 tablet (0.1 mg total) by mouth daily before breakfast. For hypertension 11/30/12   Armandina StammerNwoko, Agnes I, NP  DULoxetine (CYMBALTA) 20 MG capsule Take 1 capsule (20 mg total) by mouth daily. 09/02/13   Bridgett LarssonPost, Chris, MD  gabapentin (NEURONTIN) 300 MG capsule Take 2 capsules (600 mg total) by mouth 3 (three) times daily. For pain control/anxiety 11/30/12   Armandina StammerNwoko, Agnes I, NP  gabapentin (NEURONTIN) 300 MG capsule Take 1 capsule (300 mg total) by mouth 3 (three) times daily. 09/02/13   Bridgett LarssonPost, Chris, MD  ibuprofen (ADVIL,MOTRIN)  200 MG tablet Take 4 tablets (800 mg total) by mouth once. For pain 11/30/12   Armandina Stammer I, NP  levothyroxine (SYNTHROID, LEVOTHROID) 100 MCG tablet Take 1 tablet (100 mcg total) by mouth daily before breakfast. For thyroid hormone replacement 11/30/12   Armandina Stammer I, NP  levothyroxine (SYNTHROID, LEVOTHROID) 100 MCG tablet Take 1 tablet (100 mcg total) by mouth daily before breakfast. 09/02/13   Post, Thayer Ohm, MD  lidocaine (LIDODERM) 5 % Place 2 patches onto the skin daily. Remove & Discard patch within 12 hours or as directed by MD: For pain 11/30/12   Armandina Stammer I, NP  nicotine (NICODERM CQ - DOSED  IN MG/24 HOURS) 21 mg/24hr patch Place 1 patch onto the skin daily. Absolutely do not smoke with the patch on.  If you must smoke please take the patch off: For nicotine dependency 11/30/12   Armandina Stammer I, NP  pantoprazole (PROTONIX) 40 MG tablet Take 1 tablet (40 mg total) by mouth once. For acid reflux 11/30/12   Armandina Stammer I, NP  polyethylene glycol (MIRALAX / GLYCOLAX) packet Take 17 g by mouth 3 (three) times daily. For constipation 11/30/12   Armandina Stammer I, NP  QUEtiapine (SEROQUEL) 100 MG tablet Take 1 tablet (100 mg total) by mouth at bedtime. For mood control 11/30/12   Armandina Stammer I, NP  traZODone (DESYREL) 50 MG tablet Take 1 tablet (50 mg total) by mouth at bedtime as needed and may repeat dose one time if needed for sleep or depression. For depression/sleep 11/30/12   Sanjuana Kava, NP    Family History Family History  Problem Relation Age of Onset  . Cancer Mother   . Alcohol abuse Father   . Hypertension Father     Social History Social History  Substance Use Topics  . Smoking status: Current Every Day Smoker    Packs/day: 0.50    Years: 26.00    Types: Cigarettes  . Smokeless tobacco: Never Used  . Alcohol use Yes     Comment: former     Allergies   Patient has no known allergies.   Review of Systems Review of Systems  Constitutional: Negative for chills and fever.  HENT: Negative for congestion and rhinorrhea.   Eyes: Negative for redness and visual disturbance.  Respiratory: Negative for shortness of breath and wheezing.   Cardiovascular: Negative for chest pain and palpitations.  Gastrointestinal: Negative for abdominal pain, nausea and vomiting.  Genitourinary: Negative for dysuria and urgency.  Musculoskeletal: Negative for arthralgias and myalgias.  Skin: Negative for pallor and wound.  Neurological: Negative for dizziness and headaches.     Physical Exam Updated Vital Signs BP (!) 129/104 (BP Location: Left Arm)   Pulse 84   Temp 98.1 F (36.7  C) (Oral)   Ht 5\' 8"  (1.727 m)   Wt 75.2 kg (165 lb 12.8 oz)   SpO2 98%   BMI 25.21 kg/m   Physical Exam  Constitutional: She is oriented to person, place, and time. She appears well-developed and well-nourished. No distress.  HENT:  Head: Normocephalic and atraumatic.  Eyes: Pupils are equal, round, and reactive to light. EOM are normal.  Neck: Normal range of motion. Neck supple.  Cardiovascular: Normal rate and regular rhythm.  Exam reveals no gallop and no friction rub.   No murmur heard. Pulmonary/Chest: Effort normal. She has no wheezes. She has no rales.  Abdominal: Soft. She exhibits no distension and no mass. There is no tenderness. There is no  guarding.  Musculoskeletal: She exhibits no edema or tenderness.  Neurological: She is alert and oriented to person, place, and time.  Skin: Skin is warm and dry. She is not diaphoretic.  Psychiatric: She has a normal mood and affect. Her behavior is normal.  Nursing note and vitals reviewed.    ED Treatments / Results  Labs (all labs ordered are listed, but only abnormal results are displayed) Labs Reviewed  COMPREHENSIVE METABOLIC PANEL - Abnormal; Notable for the following:       Result Value   Creatinine, Ser 1.09 (*)    All other components within normal limits  RAPID URINE DRUG SCREEN, HOSP PERFORMED - Abnormal; Notable for the following:    Cocaine POSITIVE (*)    Tetrahydrocannabinol POSITIVE (*)    All other components within normal limits  URINALYSIS, ROUTINE W REFLEX MICROSCOPIC - Abnormal; Notable for the following:    Color, Urine AMBER (*)    APPearance CLOUDY (*)    Hgb urine dipstick MODERATE (*)    Ketones, ur 5 (*)    Protein, ur 30 (*)    Nitrite POSITIVE (*)    Leukocytes, UA SMALL (*)    Bacteria, UA MANY (*)    Squamous Epithelial / LPF TOO NUMEROUS TO COUNT (*)    All other components within normal limits  ETHANOL  CBC    EKG  EKG Interpretation None       Radiology No results  found.  Procedures Procedures (including critical care time)  Medications Ordered in ED Medications  ketorolac (TORADOL) injection 15 mg (15 mg Intramuscular Given 05/29/17 2120)  acetaminophen (TYLENOL) tablet 1,000 mg (1,000 mg Oral Given 05/29/17 2120)     Initial Impression / Assessment and Plan / ED Course  I have reviewed the triage vital signs and the nursing notes.  Pertinent labs & imaging results that were available during my care of the patient were reviewed by me and considered in my medical decision making (see chart for details).     46 yo F with a cc requesting withdrawal from crack.  The patient was also complaining of dysuria and right flank pain. UA looks infected. Will treat with Tylenol. Follow-up with outpatient rehabilitation.  11:22 PM:  I have discussed the diagnosis/risks/treatment options with the patient and believe the pt to be eligible for discharge home to follow-up with PCP, Rehab. We also discussed returning to the ED immediately if new or worsening sx occur. We discussed the sx which are most concerning (e.g., sudden worsening pain, fever, inability to tolerate by mouth) that necessitate immediate return. Medications administered to the patient during their visit and any new prescriptions provided to the patient are listed below.  Medications given during this visit Medications  ketorolac (TORADOL) injection 15 mg (15 mg Intramuscular Given 05/29/17 2120)  acetaminophen (TYLENOL) tablet 1,000 mg (1,000 mg Oral Given 05/29/17 2120)     The patient appears reasonably screen and/or stabilized for discharge and I doubt any other medical condition or other [facility] requiring further screening, evaluation, or treatment in the ED at this time prior to discharge.    Final Clinical Impressions(s) / ED Diagnoses   Final diagnoses:  Pyelonephritis    New Prescriptions Discharge Medication List as of 05/29/2017  9:40 PM    START taking these medications   Details   cephALEXin (KEFLEX) 500 MG capsule Take 1 capsule (500 mg total) by mouth 4 (four) times daily., Starting Fri 05/29/2017, Print  Melene Plan, DO 05/29/17 2322

## 2017-05-29 NOTE — ED Notes (Signed)
Patient denies pain and is resting comfortably.  

## 2017-05-29 NOTE — ED Triage Notes (Signed)
Pt presents d/t relapse of crack.  Per pt she has been sober since 2015.  Pt reports approximately one month ago she began using crack again.  States she uses an ounce to an ounce and a half a day x 3 weeks.  Last use 4 hours PTA.  Pt reports not sleeping, eating or making much urine (Voiding once a day).  Endorses pain to right flank.  Pt denies SI/HI or AVH.  Pt is tearful in triage asking for help to get clean stating, "I can't live this way anymore."

## 2017-09-16 DIAGNOSIS — J101 Influenza due to other identified influenza virus with other respiratory manifestations: Secondary | ICD-10-CM

## 2017-09-16 DIAGNOSIS — R0902 Hypoxemia: Secondary | ICD-10-CM

## 2017-09-16 DIAGNOSIS — J209 Acute bronchitis, unspecified: Secondary | ICD-10-CM

## 2024-04-15 ENCOUNTER — Encounter (INDEPENDENT_AMBULATORY_CARE_PROVIDER_SITE_OTHER): Payer: Self-pay
